# Patient Record
Sex: Male | Born: 1950
Health system: Southern US, Community
[De-identification: ages and names within clinical notes are randomized; demographics above are authoritative.]

## PROBLEM LIST (undated history)

## (undated) DIAGNOSIS — I10 Essential (primary) hypertension: Secondary | ICD-10-CM

## (undated) DIAGNOSIS — K219 Gastro-esophageal reflux disease without esophagitis: Secondary | ICD-10-CM

## (undated) HISTORY — PX: NO PAST SURGERIES: SHX2092

---

## 2000-11-05 ENCOUNTER — Encounter: Payer: Self-pay | Admitting: Emergency Medicine

## 2000-11-06 ENCOUNTER — Inpatient Hospital Stay (HOSPITAL_COMMUNITY): Admission: EM | Admit: 2000-11-06 | Discharge: 2000-11-07 | Payer: Self-pay | Admitting: *Deleted

## 2000-11-07 ENCOUNTER — Encounter: Payer: Self-pay | Admitting: Neurology

## 2000-11-13 ENCOUNTER — Ambulatory Visit (HOSPITAL_COMMUNITY): Admission: RE | Admit: 2000-11-13 | Discharge: 2000-11-13 | Payer: Self-pay | Admitting: Neurology

## 2000-11-13 ENCOUNTER — Encounter: Payer: Self-pay | Admitting: Neurology

## 2011-08-27 DIAGNOSIS — H2589 Other age-related cataract: Secondary | ICD-10-CM | POA: Diagnosis not present

## 2011-08-27 DIAGNOSIS — H02139 Senile ectropion of unspecified eye, unspecified eyelid: Secondary | ICD-10-CM | POA: Diagnosis not present

## 2011-08-27 DIAGNOSIS — H04129 Dry eye syndrome of unspecified lacrimal gland: Secondary | ICD-10-CM | POA: Diagnosis not present

## 2011-10-02 DIAGNOSIS — J020 Streptococcal pharyngitis: Secondary | ICD-10-CM | POA: Diagnosis not present

## 2011-10-02 DIAGNOSIS — E782 Mixed hyperlipidemia: Secondary | ICD-10-CM | POA: Diagnosis not present

## 2011-10-02 DIAGNOSIS — E7800 Pure hypercholesterolemia, unspecified: Secondary | ICD-10-CM | POA: Diagnosis not present

## 2011-10-02 DIAGNOSIS — Z79899 Other long term (current) drug therapy: Secondary | ICD-10-CM | POA: Diagnosis not present

## 2011-10-02 DIAGNOSIS — I10 Essential (primary) hypertension: Secondary | ICD-10-CM | POA: Diagnosis not present

## 2011-10-02 DIAGNOSIS — I6350 Cerebral infarction due to unspecified occlusion or stenosis of unspecified cerebral artery: Secondary | ICD-10-CM | POA: Diagnosis not present

## 2012-03-31 DIAGNOSIS — Z0000 Encounter for general adult medical examination without abnormal findings: Secondary | ICD-10-CM | POA: Diagnosis not present

## 2012-03-31 DIAGNOSIS — I10 Essential (primary) hypertension: Secondary | ICD-10-CM | POA: Diagnosis not present

## 2012-03-31 DIAGNOSIS — Z125 Encounter for screening for malignant neoplasm of prostate: Secondary | ICD-10-CM | POA: Diagnosis not present

## 2012-03-31 DIAGNOSIS — Z6832 Body mass index (BMI) 32.0-32.9, adult: Secondary | ICD-10-CM | POA: Diagnosis not present

## 2012-03-31 DIAGNOSIS — E782 Mixed hyperlipidemia: Secondary | ICD-10-CM | POA: Diagnosis not present

## 2012-05-03 DIAGNOSIS — R7401 Elevation of levels of liver transaminase levels: Secondary | ICD-10-CM | POA: Diagnosis not present

## 2012-06-02 DIAGNOSIS — Z23 Encounter for immunization: Secondary | ICD-10-CM | POA: Diagnosis not present

## 2012-09-08 DIAGNOSIS — H02139 Senile ectropion of unspecified eye, unspecified eyelid: Secondary | ICD-10-CM | POA: Diagnosis not present

## 2012-09-08 DIAGNOSIS — H04129 Dry eye syndrome of unspecified lacrimal gland: Secondary | ICD-10-CM | POA: Diagnosis not present

## 2012-10-03 DIAGNOSIS — E782 Mixed hyperlipidemia: Secondary | ICD-10-CM | POA: Diagnosis not present

## 2012-10-03 DIAGNOSIS — I10 Essential (primary) hypertension: Secondary | ICD-10-CM | POA: Diagnosis not present

## 2012-10-03 DIAGNOSIS — G4700 Insomnia, unspecified: Secondary | ICD-10-CM | POA: Diagnosis not present

## 2012-10-03 DIAGNOSIS — I6350 Cerebral infarction due to unspecified occlusion or stenosis of unspecified cerebral artery: Secondary | ICD-10-CM | POA: Diagnosis not present

## 2013-05-31 DIAGNOSIS — Z23 Encounter for immunization: Secondary | ICD-10-CM | POA: Diagnosis not present

## 2013-06-15 DIAGNOSIS — I10 Essential (primary) hypertension: Secondary | ICD-10-CM | POA: Diagnosis not present

## 2013-06-15 DIAGNOSIS — Z125 Encounter for screening for malignant neoplasm of prostate: Secondary | ICD-10-CM | POA: Diagnosis not present

## 2013-06-15 DIAGNOSIS — I6350 Cerebral infarction due to unspecified occlusion or stenosis of unspecified cerebral artery: Secondary | ICD-10-CM | POA: Diagnosis not present

## 2013-06-15 DIAGNOSIS — G4700 Insomnia, unspecified: Secondary | ICD-10-CM | POA: Diagnosis not present

## 2013-06-15 DIAGNOSIS — Z0000 Encounter for general adult medical examination without abnormal findings: Secondary | ICD-10-CM | POA: Diagnosis not present

## 2013-06-15 DIAGNOSIS — E782 Mixed hyperlipidemia: Secondary | ICD-10-CM | POA: Diagnosis not present

## 2013-06-15 DIAGNOSIS — Z79899 Other long term (current) drug therapy: Secondary | ICD-10-CM | POA: Diagnosis not present

## 2013-09-07 DIAGNOSIS — H2589 Other age-related cataract: Secondary | ICD-10-CM | POA: Diagnosis not present

## 2013-09-07 DIAGNOSIS — H5200 Hypermetropia, unspecified eye: Secondary | ICD-10-CM | POA: Diagnosis not present

## 2013-12-19 DIAGNOSIS — G4700 Insomnia, unspecified: Secondary | ICD-10-CM | POA: Diagnosis not present

## 2013-12-19 DIAGNOSIS — E782 Mixed hyperlipidemia: Secondary | ICD-10-CM | POA: Diagnosis not present

## 2013-12-19 DIAGNOSIS — I6350 Cerebral infarction due to unspecified occlusion or stenosis of unspecified cerebral artery: Secondary | ICD-10-CM | POA: Diagnosis not present

## 2013-12-19 DIAGNOSIS — I10 Essential (primary) hypertension: Secondary | ICD-10-CM | POA: Diagnosis not present

## 2014-06-21 DIAGNOSIS — Z79899 Other long term (current) drug therapy: Secondary | ICD-10-CM | POA: Diagnosis not present

## 2014-06-21 DIAGNOSIS — K219 Gastro-esophageal reflux disease without esophagitis: Secondary | ICD-10-CM | POA: Diagnosis not present

## 2014-06-21 DIAGNOSIS — I639 Cerebral infarction, unspecified: Secondary | ICD-10-CM | POA: Diagnosis not present

## 2014-06-21 DIAGNOSIS — G4700 Insomnia, unspecified: Secondary | ICD-10-CM | POA: Diagnosis not present

## 2014-06-21 DIAGNOSIS — Z9181 History of falling: Secondary | ICD-10-CM | POA: Diagnosis not present

## 2014-06-21 DIAGNOSIS — Z0000 Encounter for general adult medical examination without abnormal findings: Secondary | ICD-10-CM | POA: Diagnosis not present

## 2014-06-21 DIAGNOSIS — Z23 Encounter for immunization: Secondary | ICD-10-CM | POA: Diagnosis not present

## 2014-06-21 DIAGNOSIS — I10 Essential (primary) hypertension: Secondary | ICD-10-CM | POA: Diagnosis not present

## 2014-06-21 DIAGNOSIS — E782 Mixed hyperlipidemia: Secondary | ICD-10-CM | POA: Diagnosis not present

## 2014-06-21 DIAGNOSIS — Z125 Encounter for screening for malignant neoplasm of prostate: Secondary | ICD-10-CM | POA: Diagnosis not present

## 2014-08-31 DIAGNOSIS — H04129 Dry eye syndrome of unspecified lacrimal gland: Secondary | ICD-10-CM | POA: Diagnosis not present

## 2014-08-31 DIAGNOSIS — H5203 Hypermetropia, bilateral: Secondary | ICD-10-CM | POA: Diagnosis not present

## 2014-12-24 DIAGNOSIS — I10 Essential (primary) hypertension: Secondary | ICD-10-CM | POA: Diagnosis not present

## 2014-12-24 DIAGNOSIS — R7309 Other abnormal glucose: Secondary | ICD-10-CM | POA: Diagnosis not present

## 2014-12-24 DIAGNOSIS — Z6832 Body mass index (BMI) 32.0-32.9, adult: Secondary | ICD-10-CM | POA: Diagnosis not present

## 2014-12-24 DIAGNOSIS — Z1389 Encounter for screening for other disorder: Secondary | ICD-10-CM | POA: Diagnosis not present

## 2014-12-24 DIAGNOSIS — I639 Cerebral infarction, unspecified: Secondary | ICD-10-CM | POA: Diagnosis not present

## 2014-12-24 DIAGNOSIS — G4700 Insomnia, unspecified: Secondary | ICD-10-CM | POA: Diagnosis not present

## 2014-12-24 DIAGNOSIS — E782 Mixed hyperlipidemia: Secondary | ICD-10-CM | POA: Diagnosis not present

## 2015-01-17 DIAGNOSIS — Z1389 Encounter for screening for other disorder: Secondary | ICD-10-CM | POA: Diagnosis not present

## 2015-01-17 DIAGNOSIS — Z6831 Body mass index (BMI) 31.0-31.9, adult: Secondary | ICD-10-CM | POA: Diagnosis not present

## 2015-01-17 DIAGNOSIS — L309 Dermatitis, unspecified: Secondary | ICD-10-CM | POA: Diagnosis not present

## 2015-01-17 DIAGNOSIS — B3742 Candidal balanitis: Secondary | ICD-10-CM | POA: Diagnosis not present

## 2015-03-27 DIAGNOSIS — Z1389 Encounter for screening for other disorder: Secondary | ICD-10-CM | POA: Diagnosis not present

## 2015-03-27 DIAGNOSIS — Z6830 Body mass index (BMI) 30.0-30.9, adult: Secondary | ICD-10-CM | POA: Diagnosis not present

## 2015-03-27 DIAGNOSIS — B3742 Candidal balanitis: Secondary | ICD-10-CM | POA: Diagnosis not present

## 2015-03-27 DIAGNOSIS — R7309 Other abnormal glucose: Secondary | ICD-10-CM | POA: Diagnosis not present

## 2015-06-28 DIAGNOSIS — R7303 Prediabetes: Secondary | ICD-10-CM | POA: Diagnosis not present

## 2015-06-28 DIAGNOSIS — N481 Balanitis: Secondary | ICD-10-CM | POA: Diagnosis not present

## 2015-06-28 DIAGNOSIS — Z0000 Encounter for general adult medical examination without abnormal findings: Secondary | ICD-10-CM | POA: Diagnosis not present

## 2015-06-28 DIAGNOSIS — Z23 Encounter for immunization: Secondary | ICD-10-CM | POA: Diagnosis not present

## 2015-06-28 DIAGNOSIS — Z125 Encounter for screening for malignant neoplasm of prostate: Secondary | ICD-10-CM | POA: Diagnosis not present

## 2015-06-28 DIAGNOSIS — Z86010 Personal history of colonic polyps: Secondary | ICD-10-CM | POA: Diagnosis not present

## 2015-06-28 DIAGNOSIS — E782 Mixed hyperlipidemia: Secondary | ICD-10-CM | POA: Diagnosis not present

## 2015-06-28 DIAGNOSIS — G4700 Insomnia, unspecified: Secondary | ICD-10-CM | POA: Diagnosis not present

## 2015-06-28 DIAGNOSIS — I639 Cerebral infarction, unspecified: Secondary | ICD-10-CM | POA: Diagnosis not present

## 2015-06-28 DIAGNOSIS — I10 Essential (primary) hypertension: Secondary | ICD-10-CM | POA: Diagnosis not present

## 2015-06-28 DIAGNOSIS — M79645 Pain in left finger(s): Secondary | ICD-10-CM | POA: Diagnosis not present

## 2015-06-28 DIAGNOSIS — Z6831 Body mass index (BMI) 31.0-31.9, adult: Secondary | ICD-10-CM | POA: Diagnosis not present

## 2015-07-17 DIAGNOSIS — M1812 Unilateral primary osteoarthritis of first carpometacarpal joint, left hand: Secondary | ICD-10-CM | POA: Diagnosis not present

## 2015-07-17 DIAGNOSIS — M19042 Primary osteoarthritis, left hand: Secondary | ICD-10-CM | POA: Diagnosis not present

## 2015-07-17 DIAGNOSIS — M79645 Pain in left finger(s): Secondary | ICD-10-CM | POA: Diagnosis not present

## 2015-07-29 DIAGNOSIS — M19042 Primary osteoarthritis, left hand: Secondary | ICD-10-CM | POA: Diagnosis not present

## 2015-08-20 DIAGNOSIS — Z1211 Encounter for screening for malignant neoplasm of colon: Secondary | ICD-10-CM | POA: Diagnosis not present

## 2015-08-20 DIAGNOSIS — Z86010 Personal history of colonic polyps: Secondary | ICD-10-CM | POA: Diagnosis not present

## 2015-09-20 DIAGNOSIS — H2513 Age-related nuclear cataract, bilateral: Secondary | ICD-10-CM | POA: Diagnosis not present

## 2015-09-20 DIAGNOSIS — H5203 Hypermetropia, bilateral: Secondary | ICD-10-CM | POA: Diagnosis not present

## 2015-09-20 DIAGNOSIS — H04129 Dry eye syndrome of unspecified lacrimal gland: Secondary | ICD-10-CM | POA: Diagnosis not present

## 2015-09-23 DIAGNOSIS — G4700 Insomnia, unspecified: Secondary | ICD-10-CM | POA: Diagnosis not present

## 2015-09-23 DIAGNOSIS — D124 Benign neoplasm of descending colon: Secondary | ICD-10-CM | POA: Diagnosis not present

## 2015-09-23 DIAGNOSIS — Z800 Family history of malignant neoplasm of digestive organs: Secondary | ICD-10-CM | POA: Diagnosis not present

## 2015-09-23 DIAGNOSIS — K648 Other hemorrhoids: Secondary | ICD-10-CM | POA: Diagnosis not present

## 2015-09-23 DIAGNOSIS — Z87891 Personal history of nicotine dependence: Secondary | ICD-10-CM | POA: Diagnosis not present

## 2015-09-23 DIAGNOSIS — K219 Gastro-esophageal reflux disease without esophagitis: Secondary | ICD-10-CM | POA: Diagnosis not present

## 2015-09-23 DIAGNOSIS — Z86010 Personal history of colonic polyps: Secondary | ICD-10-CM | POA: Diagnosis not present

## 2015-09-23 DIAGNOSIS — Z1211 Encounter for screening for malignant neoplasm of colon: Secondary | ICD-10-CM | POA: Diagnosis not present

## 2015-09-23 DIAGNOSIS — E785 Hyperlipidemia, unspecified: Secondary | ICD-10-CM | POA: Diagnosis not present

## 2015-09-23 DIAGNOSIS — Z8673 Personal history of transient ischemic attack (TIA), and cerebral infarction without residual deficits: Secondary | ICD-10-CM | POA: Diagnosis not present

## 2015-09-23 DIAGNOSIS — K5730 Diverticulosis of large intestine without perforation or abscess without bleeding: Secondary | ICD-10-CM | POA: Diagnosis not present

## 2015-10-31 DIAGNOSIS — Z1389 Encounter for screening for other disorder: Secondary | ICD-10-CM | POA: Diagnosis not present

## 2015-10-31 DIAGNOSIS — I10 Essential (primary) hypertension: Secondary | ICD-10-CM | POA: Diagnosis not present

## 2015-10-31 DIAGNOSIS — R7303 Prediabetes: Secondary | ICD-10-CM | POA: Diagnosis not present

## 2015-10-31 DIAGNOSIS — E782 Mixed hyperlipidemia: Secondary | ICD-10-CM | POA: Diagnosis not present

## 2015-10-31 DIAGNOSIS — E669 Obesity, unspecified: Secondary | ICD-10-CM | POA: Diagnosis not present

## 2015-10-31 DIAGNOSIS — G4700 Insomnia, unspecified: Secondary | ICD-10-CM | POA: Diagnosis not present

## 2015-10-31 DIAGNOSIS — I639 Cerebral infarction, unspecified: Secondary | ICD-10-CM | POA: Diagnosis not present

## 2015-10-31 DIAGNOSIS — M19042 Primary osteoarthritis, left hand: Secondary | ICD-10-CM | POA: Diagnosis not present

## 2015-10-31 DIAGNOSIS — Z6831 Body mass index (BMI) 31.0-31.9, adult: Secondary | ICD-10-CM | POA: Diagnosis not present

## 2016-03-05 DIAGNOSIS — G4700 Insomnia, unspecified: Secondary | ICD-10-CM | POA: Diagnosis not present

## 2016-03-05 DIAGNOSIS — N481 Balanitis: Secondary | ICD-10-CM | POA: Diagnosis not present

## 2016-03-05 DIAGNOSIS — I10 Essential (primary) hypertension: Secondary | ICD-10-CM | POA: Diagnosis not present

## 2016-03-05 DIAGNOSIS — I639 Cerebral infarction, unspecified: Secondary | ICD-10-CM | POA: Diagnosis not present

## 2016-03-05 DIAGNOSIS — E782 Mixed hyperlipidemia: Secondary | ICD-10-CM | POA: Diagnosis not present

## 2016-03-05 DIAGNOSIS — R7303 Prediabetes: Secondary | ICD-10-CM | POA: Diagnosis not present

## 2016-03-05 DIAGNOSIS — K219 Gastro-esophageal reflux disease without esophagitis: Secondary | ICD-10-CM | POA: Diagnosis not present

## 2016-05-29 DIAGNOSIS — Z23 Encounter for immunization: Secondary | ICD-10-CM | POA: Diagnosis not present

## 2016-07-08 DIAGNOSIS — E782 Mixed hyperlipidemia: Secondary | ICD-10-CM | POA: Diagnosis not present

## 2016-07-08 DIAGNOSIS — R1031 Right lower quadrant pain: Secondary | ICD-10-CM | POA: Diagnosis not present

## 2016-07-08 DIAGNOSIS — Z0000 Encounter for general adult medical examination without abnormal findings: Secondary | ICD-10-CM | POA: Diagnosis not present

## 2016-07-08 DIAGNOSIS — Z23 Encounter for immunization: Secondary | ICD-10-CM | POA: Diagnosis not present

## 2016-07-08 DIAGNOSIS — G4700 Insomnia, unspecified: Secondary | ICD-10-CM | POA: Diagnosis not present

## 2016-07-08 DIAGNOSIS — Z6832 Body mass index (BMI) 32.0-32.9, adult: Secondary | ICD-10-CM | POA: Diagnosis not present

## 2016-07-08 DIAGNOSIS — Z8673 Personal history of transient ischemic attack (TIA), and cerebral infarction without residual deficits: Secondary | ICD-10-CM | POA: Diagnosis not present

## 2016-07-08 DIAGNOSIS — R7303 Prediabetes: Secondary | ICD-10-CM | POA: Diagnosis not present

## 2016-07-08 DIAGNOSIS — I10 Essential (primary) hypertension: Secondary | ICD-10-CM | POA: Diagnosis not present

## 2016-07-08 DIAGNOSIS — Z125 Encounter for screening for malignant neoplasm of prostate: Secondary | ICD-10-CM | POA: Diagnosis not present

## 2016-09-18 DIAGNOSIS — H2513 Age-related nuclear cataract, bilateral: Secondary | ICD-10-CM | POA: Diagnosis not present

## 2016-09-18 DIAGNOSIS — H524 Presbyopia: Secondary | ICD-10-CM | POA: Diagnosis not present

## 2016-09-18 DIAGNOSIS — H04123 Dry eye syndrome of bilateral lacrimal glands: Secondary | ICD-10-CM | POA: Diagnosis not present

## 2016-10-21 DIAGNOSIS — B356 Tinea cruris: Secondary | ICD-10-CM | POA: Diagnosis not present

## 2016-10-21 DIAGNOSIS — Z6833 Body mass index (BMI) 33.0-33.9, adult: Secondary | ICD-10-CM | POA: Diagnosis not present

## 2016-11-03 DIAGNOSIS — B356 Tinea cruris: Secondary | ICD-10-CM | POA: Diagnosis not present

## 2016-11-03 DIAGNOSIS — Z6833 Body mass index (BMI) 33.0-33.9, adult: Secondary | ICD-10-CM | POA: Diagnosis not present

## 2016-11-16 DIAGNOSIS — R21 Rash and other nonspecific skin eruption: Secondary | ICD-10-CM | POA: Diagnosis not present

## 2016-11-16 DIAGNOSIS — E669 Obesity, unspecified: Secondary | ICD-10-CM | POA: Diagnosis not present

## 2016-11-16 DIAGNOSIS — Z6833 Body mass index (BMI) 33.0-33.9, adult: Secondary | ICD-10-CM | POA: Diagnosis not present

## 2016-11-23 DIAGNOSIS — L853 Xerosis cutis: Secondary | ICD-10-CM | POA: Diagnosis not present

## 2016-11-23 DIAGNOSIS — L304 Erythema intertrigo: Secondary | ICD-10-CM | POA: Diagnosis not present

## 2016-12-21 DIAGNOSIS — L304 Erythema intertrigo: Secondary | ICD-10-CM | POA: Diagnosis not present

## 2017-01-07 DIAGNOSIS — I10 Essential (primary) hypertension: Secondary | ICD-10-CM | POA: Diagnosis not present

## 2017-01-07 DIAGNOSIS — E782 Mixed hyperlipidemia: Secondary | ICD-10-CM | POA: Diagnosis not present

## 2017-01-07 DIAGNOSIS — Z1389 Encounter for screening for other disorder: Secondary | ICD-10-CM | POA: Diagnosis not present

## 2017-01-07 DIAGNOSIS — G4700 Insomnia, unspecified: Secondary | ICD-10-CM | POA: Diagnosis not present

## 2017-01-07 DIAGNOSIS — I639 Cerebral infarction, unspecified: Secondary | ICD-10-CM | POA: Diagnosis not present

## 2017-01-07 DIAGNOSIS — Z23 Encounter for immunization: Secondary | ICD-10-CM | POA: Diagnosis not present

## 2017-01-07 DIAGNOSIS — Z9181 History of falling: Secondary | ICD-10-CM | POA: Diagnosis not present

## 2017-01-07 DIAGNOSIS — R7303 Prediabetes: Secondary | ICD-10-CM | POA: Diagnosis not present

## 2017-01-12 DIAGNOSIS — B356 Tinea cruris: Secondary | ICD-10-CM | POA: Diagnosis not present

## 2017-05-12 DIAGNOSIS — Z23 Encounter for immunization: Secondary | ICD-10-CM | POA: Diagnosis not present

## 2017-07-09 DIAGNOSIS — Z6831 Body mass index (BMI) 31.0-31.9, adult: Secondary | ICD-10-CM | POA: Diagnosis not present

## 2017-07-09 DIAGNOSIS — E785 Hyperlipidemia, unspecified: Secondary | ICD-10-CM | POA: Diagnosis not present

## 2017-07-09 DIAGNOSIS — E669 Obesity, unspecified: Secondary | ICD-10-CM | POA: Diagnosis not present

## 2017-07-09 DIAGNOSIS — Z125 Encounter for screening for malignant neoplasm of prostate: Secondary | ICD-10-CM | POA: Diagnosis not present

## 2017-07-09 DIAGNOSIS — Z136 Encounter for screening for cardiovascular disorders: Secondary | ICD-10-CM | POA: Diagnosis not present

## 2017-07-09 DIAGNOSIS — Z139 Encounter for screening, unspecified: Secondary | ICD-10-CM | POA: Diagnosis not present

## 2017-07-09 DIAGNOSIS — Z0000 Encounter for general adult medical examination without abnormal findings: Secondary | ICD-10-CM | POA: Diagnosis not present

## 2017-07-22 DIAGNOSIS — I10 Essential (primary) hypertension: Secondary | ICD-10-CM | POA: Diagnosis not present

## 2017-07-22 DIAGNOSIS — E782 Mixed hyperlipidemia: Secondary | ICD-10-CM | POA: Diagnosis not present

## 2017-07-22 DIAGNOSIS — Z125 Encounter for screening for malignant neoplasm of prostate: Secondary | ICD-10-CM | POA: Diagnosis not present

## 2017-07-22 DIAGNOSIS — R7303 Prediabetes: Secondary | ICD-10-CM | POA: Diagnosis not present

## 2017-07-22 DIAGNOSIS — I639 Cerebral infarction, unspecified: Secondary | ICD-10-CM | POA: Diagnosis not present

## 2017-07-22 DIAGNOSIS — G4700 Insomnia, unspecified: Secondary | ICD-10-CM | POA: Diagnosis not present

## 2017-09-30 DIAGNOSIS — J209 Acute bronchitis, unspecified: Secondary | ICD-10-CM | POA: Diagnosis not present

## 2017-11-08 DIAGNOSIS — H5203 Hypermetropia, bilateral: Secondary | ICD-10-CM | POA: Diagnosis not present

## 2017-11-08 DIAGNOSIS — H04123 Dry eye syndrome of bilateral lacrimal glands: Secondary | ICD-10-CM | POA: Diagnosis not present

## 2017-11-08 DIAGNOSIS — H524 Presbyopia: Secondary | ICD-10-CM | POA: Diagnosis not present

## 2017-11-08 DIAGNOSIS — H25813 Combined forms of age-related cataract, bilateral: Secondary | ICD-10-CM | POA: Diagnosis not present

## 2017-11-08 DIAGNOSIS — H52223 Regular astigmatism, bilateral: Secondary | ICD-10-CM | POA: Diagnosis not present

## 2018-01-20 DIAGNOSIS — Z9181 History of falling: Secondary | ICD-10-CM | POA: Diagnosis not present

## 2018-01-20 DIAGNOSIS — R7303 Prediabetes: Secondary | ICD-10-CM | POA: Diagnosis not present

## 2018-01-20 DIAGNOSIS — I639 Cerebral infarction, unspecified: Secondary | ICD-10-CM | POA: Diagnosis not present

## 2018-01-20 DIAGNOSIS — I10 Essential (primary) hypertension: Secondary | ICD-10-CM | POA: Diagnosis not present

## 2018-01-20 DIAGNOSIS — E782 Mixed hyperlipidemia: Secondary | ICD-10-CM | POA: Diagnosis not present

## 2018-01-20 DIAGNOSIS — Z1331 Encounter for screening for depression: Secondary | ICD-10-CM | POA: Diagnosis not present

## 2018-03-17 ENCOUNTER — Other Ambulatory Visit: Payer: Self-pay

## 2018-05-17 DIAGNOSIS — Z23 Encounter for immunization: Secondary | ICD-10-CM | POA: Diagnosis not present

## 2018-05-17 DIAGNOSIS — T24201A Burn of second degree of unspecified site of right lower limb, except ankle and foot, initial encounter: Secondary | ICD-10-CM | POA: Diagnosis not present

## 2018-07-11 DIAGNOSIS — Z23 Encounter for immunization: Secondary | ICD-10-CM | POA: Diagnosis not present

## 2018-07-11 DIAGNOSIS — E785 Hyperlipidemia, unspecified: Secondary | ICD-10-CM | POA: Diagnosis not present

## 2018-07-11 DIAGNOSIS — Z0000 Encounter for general adult medical examination without abnormal findings: Secondary | ICD-10-CM | POA: Diagnosis not present

## 2018-07-11 DIAGNOSIS — Z1339 Encounter for screening examination for other mental health and behavioral disorders: Secondary | ICD-10-CM | POA: Diagnosis not present

## 2018-07-11 DIAGNOSIS — E669 Obesity, unspecified: Secondary | ICD-10-CM | POA: Diagnosis not present

## 2018-07-11 DIAGNOSIS — Z136 Encounter for screening for cardiovascular disorders: Secondary | ICD-10-CM | POA: Diagnosis not present

## 2018-07-11 DIAGNOSIS — Z125 Encounter for screening for malignant neoplasm of prostate: Secondary | ICD-10-CM | POA: Diagnosis not present

## 2018-07-11 DIAGNOSIS — Z6833 Body mass index (BMI) 33.0-33.9, adult: Secondary | ICD-10-CM | POA: Diagnosis not present

## 2018-07-27 DIAGNOSIS — R7303 Prediabetes: Secondary | ICD-10-CM | POA: Diagnosis not present

## 2018-07-27 DIAGNOSIS — Z125 Encounter for screening for malignant neoplasm of prostate: Secondary | ICD-10-CM | POA: Diagnosis not present

## 2018-07-27 DIAGNOSIS — G4700 Insomnia, unspecified: Secondary | ICD-10-CM | POA: Diagnosis not present

## 2018-07-27 DIAGNOSIS — E782 Mixed hyperlipidemia: Secondary | ICD-10-CM | POA: Diagnosis not present

## 2018-07-27 DIAGNOSIS — I639 Cerebral infarction, unspecified: Secondary | ICD-10-CM | POA: Diagnosis not present

## 2018-08-29 DIAGNOSIS — R7303 Prediabetes: Secondary | ICD-10-CM | POA: Diagnosis not present

## 2018-09-20 DIAGNOSIS — L309 Dermatitis, unspecified: Secondary | ICD-10-CM | POA: Diagnosis not present

## 2018-09-20 DIAGNOSIS — Z6833 Body mass index (BMI) 33.0-33.9, adult: Secondary | ICD-10-CM | POA: Diagnosis not present

## 2018-09-20 DIAGNOSIS — E119 Type 2 diabetes mellitus without complications: Secondary | ICD-10-CM | POA: Diagnosis not present

## 2018-11-30 ENCOUNTER — Encounter: Payer: Self-pay | Admitting: Gastroenterology

## 2019-01-26 DIAGNOSIS — I639 Cerebral infarction, unspecified: Secondary | ICD-10-CM | POA: Diagnosis not present

## 2019-01-26 DIAGNOSIS — I10 Essential (primary) hypertension: Secondary | ICD-10-CM | POA: Diagnosis not present

## 2019-01-26 DIAGNOSIS — E782 Mixed hyperlipidemia: Secondary | ICD-10-CM | POA: Diagnosis not present

## 2019-01-26 DIAGNOSIS — Z1331 Encounter for screening for depression: Secondary | ICD-10-CM | POA: Diagnosis not present

## 2019-01-26 DIAGNOSIS — E119 Type 2 diabetes mellitus without complications: Secondary | ICD-10-CM | POA: Diagnosis not present

## 2019-04-03 DIAGNOSIS — Z6831 Body mass index (BMI) 31.0-31.9, adult: Secondary | ICD-10-CM | POA: Diagnosis not present

## 2019-04-03 DIAGNOSIS — L989 Disorder of the skin and subcutaneous tissue, unspecified: Secondary | ICD-10-CM | POA: Diagnosis not present

## 2019-04-12 DIAGNOSIS — C4442 Squamous cell carcinoma of skin of scalp and neck: Secondary | ICD-10-CM | POA: Diagnosis not present

## 2019-04-28 DIAGNOSIS — Z139 Encounter for screening, unspecified: Secondary | ICD-10-CM | POA: Diagnosis not present

## 2019-04-28 DIAGNOSIS — I639 Cerebral infarction, unspecified: Secondary | ICD-10-CM | POA: Diagnosis not present

## 2019-04-28 DIAGNOSIS — E782 Mixed hyperlipidemia: Secondary | ICD-10-CM | POA: Diagnosis not present

## 2019-04-28 DIAGNOSIS — E119 Type 2 diabetes mellitus without complications: Secondary | ICD-10-CM | POA: Diagnosis not present

## 2019-04-28 DIAGNOSIS — G4700 Insomnia, unspecified: Secondary | ICD-10-CM | POA: Diagnosis not present

## 2019-04-28 DIAGNOSIS — Z23 Encounter for immunization: Secondary | ICD-10-CM | POA: Diagnosis not present

## 2019-07-24 DIAGNOSIS — Z125 Encounter for screening for malignant neoplasm of prostate: Secondary | ICD-10-CM | POA: Diagnosis not present

## 2019-07-24 DIAGNOSIS — Z6831 Body mass index (BMI) 31.0-31.9, adult: Secondary | ICD-10-CM | POA: Diagnosis not present

## 2019-07-24 DIAGNOSIS — E785 Hyperlipidemia, unspecified: Secondary | ICD-10-CM | POA: Diagnosis not present

## 2019-07-24 DIAGNOSIS — Z1211 Encounter for screening for malignant neoplasm of colon: Secondary | ICD-10-CM | POA: Diagnosis not present

## 2019-07-24 DIAGNOSIS — Z0000 Encounter for general adult medical examination without abnormal findings: Secondary | ICD-10-CM | POA: Diagnosis not present

## 2019-07-24 DIAGNOSIS — Z9181 History of falling: Secondary | ICD-10-CM | POA: Diagnosis not present

## 2019-07-24 DIAGNOSIS — Z1331 Encounter for screening for depression: Secondary | ICD-10-CM | POA: Diagnosis not present

## 2019-10-27 DIAGNOSIS — I639 Cerebral infarction, unspecified: Secondary | ICD-10-CM | POA: Diagnosis not present

## 2019-10-27 DIAGNOSIS — E782 Mixed hyperlipidemia: Secondary | ICD-10-CM | POA: Diagnosis not present

## 2019-10-27 DIAGNOSIS — Z125 Encounter for screening for malignant neoplasm of prostate: Secondary | ICD-10-CM | POA: Diagnosis not present

## 2019-10-27 DIAGNOSIS — H6120 Impacted cerumen, unspecified ear: Secondary | ICD-10-CM | POA: Diagnosis not present

## 2019-10-27 DIAGNOSIS — E119 Type 2 diabetes mellitus without complications: Secondary | ICD-10-CM | POA: Diagnosis not present

## 2019-10-27 DIAGNOSIS — I10 Essential (primary) hypertension: Secondary | ICD-10-CM | POA: Diagnosis not present

## 2019-12-29 DIAGNOSIS — H524 Presbyopia: Secondary | ICD-10-CM | POA: Diagnosis not present

## 2019-12-29 DIAGNOSIS — H2513 Age-related nuclear cataract, bilateral: Secondary | ICD-10-CM | POA: Diagnosis not present

## 2019-12-29 DIAGNOSIS — H5203 Hypermetropia, bilateral: Secondary | ICD-10-CM | POA: Diagnosis not present

## 2019-12-29 DIAGNOSIS — H52223 Regular astigmatism, bilateral: Secondary | ICD-10-CM | POA: Diagnosis not present

## 2020-03-04 DIAGNOSIS — R42 Dizziness and giddiness: Secondary | ICD-10-CM | POA: Diagnosis not present

## 2020-03-04 DIAGNOSIS — R2681 Unsteadiness on feet: Secondary | ICD-10-CM | POA: Diagnosis not present

## 2020-03-04 DIAGNOSIS — H8110 Benign paroxysmal vertigo, unspecified ear: Secondary | ICD-10-CM | POA: Diagnosis not present

## 2020-03-20 DIAGNOSIS — H8110 Benign paroxysmal vertigo, unspecified ear: Secondary | ICD-10-CM | POA: Diagnosis not present

## 2020-03-20 DIAGNOSIS — Z6830 Body mass index (BMI) 30.0-30.9, adult: Secondary | ICD-10-CM | POA: Diagnosis not present

## 2020-03-20 DIAGNOSIS — I10 Essential (primary) hypertension: Secondary | ICD-10-CM | POA: Diagnosis not present

## 2020-03-20 DIAGNOSIS — G4700 Insomnia, unspecified: Secondary | ICD-10-CM | POA: Diagnosis not present

## 2020-03-21 DIAGNOSIS — H8110 Benign paroxysmal vertigo, unspecified ear: Secondary | ICD-10-CM | POA: Diagnosis not present

## 2020-03-21 DIAGNOSIS — R42 Dizziness and giddiness: Secondary | ICD-10-CM | POA: Diagnosis not present

## 2020-03-21 DIAGNOSIS — R2681 Unsteadiness on feet: Secondary | ICD-10-CM | POA: Diagnosis not present

## 2020-03-27 DIAGNOSIS — G4700 Insomnia, unspecified: Secondary | ICD-10-CM | POA: Diagnosis not present

## 2020-03-27 DIAGNOSIS — Z6831 Body mass index (BMI) 31.0-31.9, adult: Secondary | ICD-10-CM | POA: Diagnosis not present

## 2020-03-27 DIAGNOSIS — I10 Essential (primary) hypertension: Secondary | ICD-10-CM | POA: Diagnosis not present

## 2020-03-27 DIAGNOSIS — H8110 Benign paroxysmal vertigo, unspecified ear: Secondary | ICD-10-CM | POA: Diagnosis not present

## 2020-03-28 DIAGNOSIS — R42 Dizziness and giddiness: Secondary | ICD-10-CM | POA: Diagnosis not present

## 2020-03-28 DIAGNOSIS — R2681 Unsteadiness on feet: Secondary | ICD-10-CM | POA: Diagnosis not present

## 2020-03-28 DIAGNOSIS — H8110 Benign paroxysmal vertigo, unspecified ear: Secondary | ICD-10-CM | POA: Diagnosis not present

## 2020-04-03 DIAGNOSIS — H8110 Benign paroxysmal vertigo, unspecified ear: Secondary | ICD-10-CM | POA: Diagnosis not present

## 2020-04-03 DIAGNOSIS — R2681 Unsteadiness on feet: Secondary | ICD-10-CM | POA: Diagnosis not present

## 2020-04-03 DIAGNOSIS — R42 Dizziness and giddiness: Secondary | ICD-10-CM | POA: Diagnosis not present

## 2020-04-09 DIAGNOSIS — H8110 Benign paroxysmal vertigo, unspecified ear: Secondary | ICD-10-CM | POA: Diagnosis not present

## 2020-04-09 DIAGNOSIS — R42 Dizziness and giddiness: Secondary | ICD-10-CM | POA: Diagnosis not present

## 2020-04-09 DIAGNOSIS — R2681 Unsteadiness on feet: Secondary | ICD-10-CM | POA: Diagnosis not present

## 2020-04-16 DIAGNOSIS — R2681 Unsteadiness on feet: Secondary | ICD-10-CM | POA: Diagnosis not present

## 2020-04-16 DIAGNOSIS — R42 Dizziness and giddiness: Secondary | ICD-10-CM | POA: Diagnosis not present

## 2020-04-16 DIAGNOSIS — H8110 Benign paroxysmal vertigo, unspecified ear: Secondary | ICD-10-CM | POA: Diagnosis not present

## 2020-04-23 DIAGNOSIS — R42 Dizziness and giddiness: Secondary | ICD-10-CM | POA: Diagnosis not present

## 2020-04-23 DIAGNOSIS — R2681 Unsteadiness on feet: Secondary | ICD-10-CM | POA: Diagnosis not present

## 2020-04-23 DIAGNOSIS — H8110 Benign paroxysmal vertigo, unspecified ear: Secondary | ICD-10-CM | POA: Diagnosis not present

## 2020-05-01 DIAGNOSIS — I10 Essential (primary) hypertension: Secondary | ICD-10-CM | POA: Diagnosis not present

## 2020-05-01 DIAGNOSIS — G4700 Insomnia, unspecified: Secondary | ICD-10-CM | POA: Diagnosis not present

## 2020-05-01 DIAGNOSIS — E782 Mixed hyperlipidemia: Secondary | ICD-10-CM | POA: Diagnosis not present

## 2020-05-01 DIAGNOSIS — R42 Dizziness and giddiness: Secondary | ICD-10-CM | POA: Diagnosis not present

## 2020-05-01 DIAGNOSIS — I639 Cerebral infarction, unspecified: Secondary | ICD-10-CM | POA: Diagnosis not present

## 2020-05-01 DIAGNOSIS — E119 Type 2 diabetes mellitus without complications: Secondary | ICD-10-CM | POA: Diagnosis not present

## 2020-05-01 DIAGNOSIS — Z139 Encounter for screening, unspecified: Secondary | ICD-10-CM | POA: Diagnosis not present

## 2020-05-01 DIAGNOSIS — Z23 Encounter for immunization: Secondary | ICD-10-CM | POA: Diagnosis not present

## 2020-05-01 DIAGNOSIS — Z6830 Body mass index (BMI) 30.0-30.9, adult: Secondary | ICD-10-CM | POA: Diagnosis not present

## 2020-05-01 DIAGNOSIS — R2681 Unsteadiness on feet: Secondary | ICD-10-CM | POA: Diagnosis not present

## 2020-05-01 DIAGNOSIS — H8110 Benign paroxysmal vertigo, unspecified ear: Secondary | ICD-10-CM | POA: Diagnosis not present

## 2020-05-02 ENCOUNTER — Telehealth: Payer: Self-pay | Admitting: Neurology

## 2020-05-02 ENCOUNTER — Other Ambulatory Visit: Payer: Self-pay

## 2020-05-02 ENCOUNTER — Ambulatory Visit (INDEPENDENT_AMBULATORY_CARE_PROVIDER_SITE_OTHER): Payer: Medicare Other | Admitting: Neurology

## 2020-05-02 ENCOUNTER — Encounter: Payer: Self-pay | Admitting: Neurology

## 2020-05-02 VITALS — Ht 68.0 in | Wt 196.0 lb

## 2020-05-02 DIAGNOSIS — H81399 Other peripheral vertigo, unspecified ear: Secondary | ICD-10-CM | POA: Diagnosis not present

## 2020-05-02 DIAGNOSIS — R112 Nausea with vomiting, unspecified: Secondary | ICD-10-CM | POA: Diagnosis not present

## 2020-05-02 DIAGNOSIS — R634 Abnormal weight loss: Secondary | ICD-10-CM | POA: Diagnosis not present

## 2020-05-02 DIAGNOSIS — R42 Dizziness and giddiness: Secondary | ICD-10-CM

## 2020-05-02 DIAGNOSIS — I6781 Acute cerebrovascular insufficiency: Secondary | ICD-10-CM

## 2020-05-02 DIAGNOSIS — I6782 Cerebral ischemia: Secondary | ICD-10-CM

## 2020-05-02 DIAGNOSIS — Z8673 Personal history of transient ischemic attack (TIA), and cerebral infarction without residual deficits: Secondary | ICD-10-CM

## 2020-05-02 DIAGNOSIS — G9340 Encephalopathy, unspecified: Secondary | ICD-10-CM

## 2020-05-02 DIAGNOSIS — H6123 Impacted cerumen, bilateral: Secondary | ICD-10-CM | POA: Diagnosis not present

## 2020-05-02 DIAGNOSIS — H832X3 Labyrinthine dysfunction, bilateral: Secondary | ICD-10-CM | POA: Diagnosis not present

## 2020-05-02 NOTE — Patient Instructions (Signed)
It is difficult to be certain what is causing your imbalance and dizziness, and vertigo symptoms, it could be several different issues.  Please make an appointment with Cyndi Bender, PA, because of wax impaction in both ears, I could not visualize your eardrums.  Please also make an appointment with ENT for evaluation of your inner ear, evaluation for inner ear dysfunction was recommended by your physical therapist yesterday, I will make a referral to ENT for this reason.   If you have any sudden onset of severe symptoms such as headache, one-sided weakness or numbness or tingling or droopy face or slurring of speech or severe balance issue, you have to proceed to the ER. I will order a brain MRI with and without contrast.  I will also order a MR angiogram of the neck arteries to look at any type of hardening of the neck arteries or decrease in blood flow to your brain.  I will request blood test results from your primary care office as you had recent blood tests.  You could be at risk for dehydration as you have not been able to drink enough liquids.  Please try to hydrate well with liquids including water and Gatorade.  We will arrange for a follow-up after your test results.  Again, if you have any escalation of your symptoms or severe dizziness you may have to go to the ER.  They can evaluate you much quicker than any outpatient evaluation.

## 2020-05-02 NOTE — Progress Notes (Signed)
Subjective:    Patient ID: Jose Ramirez is a 69 y.o. male.  HPI     Star Age, MD, PhD [facility] [address], [address].O. [place], [name]  Dear [name],   I saw your patient, Jose Ramirez, upon your kind request, in my Neurologic clinic today for initial consultation of his dizziness, concern for positional vertigo as well as some orthostatic hypotension.  The patient is unaccompanied today.  As you know, [name] is a 69 year old right-handed gentleman with an underlying medical history of hypertension, hyperlipidemia, reflux disease, osteoarthritis, insomnia, diabetes, remote history of hemorrhagic stroke, and obesity, who has had dizziness for the past several weeks with intermittent dizziness starting some 2 years ago.  He reports that his symptoms suddenly got worse on January 16, 2020.  He reports a spinning sensation, incoordination, nonspecific balance issue and nausea and vomiting.  He has not been to the emergency room.  He did not have any imaging tests.    I reviewed your office note from 03/27/2020.  He was noted to have a normal neurological exam at the time including normal gait and station, normal muscle strength and tone, normal orientation and no impairment of recent or remote memory.  He has had physical therapy for vestibular rehab with some benefit noted.  He also had some changes in his medications including reduction of his metoprolol and trazodone with some benefit noted.  He has had symptomatic treatment with meclizine with no significant benefit noted.  He has had nausea and vomiting.   He has lost weight due to not being able to keep food down.  He has trouble tolerating even water.  He had blood work through your office yesterday.  I do not have the test results yet but we will request laboratory results from your office.  He reports that he had his ears cleaned out through your office.  He has not seen ENT.  He denies any  ringing in the ears or hearing loss but saw his physical therapist yesterday who recommended evaluation for vestibular dysfunction.  He has had ongoing issues with symptoms of spinning and difficulty with his balance.  He had a remote brain MRI with and without contrast on 11/14/2000 and I was able to review the report: [facility].  ALTERNATIVELY, IT IS  POSSIBLE THAT THIS COULD HAVE REPRESENTED A VASCULAR MALFORMATION WHICH HAS RUPTURED.  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  He reports that he recuperated well after the stroke.  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  He denies any sudden onset of one-sided weakness or numbness or tingling, has had some intermittent headache in the back of his head but not to the point where he felt he needed to take medication.  He has had some incoordination in the left hand.   His Past Medical History Is Significant For: No past medical history on file.   His Past Surgical History Is Significant For:   His Family History Is Significant For: No family history on file.  His Social History Is Significant For: Social History   Socioeconomic History  . Marital status: Single    Spouse name: Not on file  . Number of children: Not on file  . Years of education: Not on file  . Highest education level: Not on file  Occupational History  . Not on file  Tobacco Use  . Smoking status: Never Smoker  . Smokeless tobacco: Never Used  Substance and Sexual Activity  . Alcohol use: Not Currently  . Drug use: Not Currently    Comment: Last used 20-30 years ago   . Sexual activity: Not on file  Other Topics Concern  . Not on file  Social History Narrative  . Not on file   Social Determinants of Health   Financial Resource Strain:   . Difficulty of Paying Living Expenses: Not on file   Food Insecurity:   . Worried About Charity fundraiser in the Last Year: Not on file  . Ran Out of Food in the Last Year: Not on file  Transportation Needs:   . Lack of Transportation (Medical): Not on file  . Lack of Transportation (Non-Medical): Not on file  Physical Activity:   . Days of Exercise per Week: Not on file  . Minutes of Exercise per Session: Not on file  Stress:   . Feeling of Stress : Not on file  Social Connections:   . Frequency of Communication with Friends and Family: Not on file  . Frequency of Social Gatherings with Friends and Family: Not on file  . Attends Religious Services: Not on file  . Active Member of Clubs or Organizations: Not on file  . Attends Archivist Meetings: Not on file  . Marital Status: Not on file    His Allergies Are:  No Known Allergies:   His Current Medications Are:  Outpatient Encounter Medications as of 05/02/2020  Medication Sig  . metoprolol tartrate (LOPRESSOR) 50 MG tablet Take 50 mg by mouth 2 (two) times daily.  . simvastatin (ZOCOR) 40 MG tablet Take 40 mg by mouth at bedtime.  . traZODone (DESYREL) 150 MG tablet Take 150-300 mg by mouth at bedtime.   No facility-administered encounter medications on file as of 05/02/2020.  :   Review of Systems:  Out of a complete 14 point review of systems, all are reviewed and negative with the exception of these symptoms as listed below:  Review of Systems  Neurological:       Here to discuss worsening dizziness. Pt reports he noticed an increase in dizzy spells after receiving the second dosage of the moderna vaccine.  He denies any falls.  He does have a cane at home that he uses occasion. He is seeing physical therapy for vertigo, reports during most of his sessions he will get sick to his stomach and vomit.  Pt has tried meclizine in the past but did not help with his sx    Objective:  Neurological Exam  Physical Exam Physical Examination:   There were no vitals  filed for this visit.   He has no orthostatic hypotension or orthostatic dizziness but does report feeling off balance when he stands or turns his head side to side.  General Examination: The patient is a very pleasant 69 y.o. male in no acute distress. He appears well-developed and well-nourished and well groomed.   HEENT: Normocephalic, atraumatic, pupils are equal, round and reactive to light and accommodation.  He may have endpoint nystagmus on the horizontal plane but no nystagmus with sudden change in head position, feels a spinning sensation with side to side head turning.  Tympanic membranes are not visualized secondary to bilateral cerumen impaction.  Hearing is grossly intact.  Face is symmetric with normal facial animation, normal facial sensation to light  touch, temperature and vibration.   Airway examination reveals moderate mouth dryness with thick saliva, tongue protrudes centrally in palate elevates symmetrically, no dysarthria.  No lip, neck or jaw tremor.  Chest: Clear to auscultation without wheezing, rhonchi or crackles noted.  Heart: S1+S2+0, regular and normal without murmurs, rubs or gallops noted.   Abdomen: Soft, non-tender and non-distended with normal bowel sounds appreciated on auscultation.  Extremities: There is no pitting edema in the distal lower extremities bilaterally. Pedal pulses are intact.  Skin: Warm and dry without trophic changes noted. There are no varicose veins.  Musculoskeletal: exam reveals no obvious joint deformities, tenderness or joint swelling or erythema.   Neurologically:  Mental status: The patient is awake, alert and oriented in all 4 spheres. His immediate and remote memory, attention, language skills and fund of knowledge are appropriate. There is no evidence of aphasia, agnosia, apraxia or anomia. Speech is clear with normal prosody and enunciation. Thought process is linear. Mood is normal and affect is normal.  Cranial nerves II - XII  are as described above under HEENT exam. In addition: shoulder shrug is normal with equal shoulder height noted. Motor exam: Normal bulk, strength and tone is noted. There is no drift, or rebound.  Romberg shows swaying.  He feels off balance with his eyes closed.  No resting tremor, no postural or action tremor, slight intention tremor with the left hand.   Reflexes are 1-2+ throughout. Babinski: Toes are flexor bilaterally. Fine motor skills and coordination: Grossly intact with finger taps and foot taps.    Cerebellar testing: No dysmetria or intention tremor on finger to nose testing on the right but mild dysdiadochokinesia with the left hand with finger-to-nose testing and slight intention tremor.  Heel-to-shin is slightly abnormal with the left as well.  Heel-to-shin is good with the right.  Sensory exam: intact to light touch, vibration, temperature sense in the upper and lower extremities.  Gait, station and balance: He stands slowly and stands wide-based.  He did not bring a cane or walker.  He walks slightly wide-based and slowly and slightly in securely, preserved arm swing, no shuffling, tandem walk is not possible for him.  He feels insecure.    Assessment and Plan:   In summary, Rogue Rafalski is a very pleasant 69 y.o.-year old male with an underlying medical history of hypertension, hyperlipidemia, reflux disease, osteoarthritis, insomnia, diabetes, remote history of hemorrhagic stroke, and obesity, who presents for evaluation of his family abrupt onset of dizziness on 01/16/2020.  He has not been evaluated on a more urgent basis at an emergency room but is advised to seek immediate medical attention should he have any sudden escalation of symptoms such as severe nausea vomiting or headache or one-sided weakness or numbness or tingling.  He has not fallen but has come close, feels off balance.  Examination shows some cerebellar dysfunction in the left upper more so than left lower extremity.  He  does not have any significant dysarthria, has mouth dryness, has lost weight and has had intermittent nausea and vomiting.  He does have positional vertigo symptoms, no obvious positional nystagmus but some end gaze nystagmus today.  I could not evaluate his ear canals due to cerumen impaction.  I am not quite sure as to the etiology of his symptoms, a brainstem stroke or cerebellar stroke cannot be excluded.  He had a prior brainstem stroke.  However, from the functional standpoint he did well and went back to work.  He  clearly is impaired in his function currently, could be a combination of things, no obvious or telltale orthostatic symptoms or orthostatic hypotension today.   I would like to proceed with a brain MRI with and without contrast as well as MR angiogram of the head and neck.  I would like to make a referral to ENT to rule out vestibular dysfunction.  He is advised to make a follow-up appointment with your office to see about irrigation of his ear canals as he has obvious cerumen impaction.  It may help with his symptoms.  In addition, I would like to review his recent blood tests.  He is having difficulty keeping even fluids down.  He may be at risk for dehydration.  He is encouraged to try to hydrate as best as possible.  I did not suggest any new medications at this time, we will keep him posted as to his test results as soon as possible.  I plan to see him back after testing.    Thank you very much for allowing me to participate in the care of this nice patient. If I can be of any further assistance to you please do not hesitate to call me at [phone].  Sincerely,   Star Age, MD, PhD

## 2020-05-02 NOTE — Telephone Encounter (Signed)
Medicare/medicaid order sent to GI. No auth they will reach out to the patient to schedule.  °

## 2020-05-07 ENCOUNTER — Telehealth: Payer: Self-pay | Admitting: Neurology

## 2020-05-07 DIAGNOSIS — H6123 Impacted cerumen, bilateral: Secondary | ICD-10-CM | POA: Diagnosis not present

## 2020-05-07 DIAGNOSIS — F418 Other specified anxiety disorders: Secondary | ICD-10-CM | POA: Diagnosis not present

## 2020-05-07 DIAGNOSIS — Z8673 Personal history of transient ischemic attack (TIA), and cerebral infarction without residual deficits: Secondary | ICD-10-CM | POA: Diagnosis not present

## 2020-05-07 DIAGNOSIS — R42 Dizziness and giddiness: Secondary | ICD-10-CM | POA: Diagnosis not present

## 2020-05-07 DIAGNOSIS — Z6830 Body mass index (BMI) 30.0-30.9, adult: Secondary | ICD-10-CM | POA: Diagnosis not present

## 2020-05-07 NOTE — Telephone Encounter (Signed)
I received blood test results from the primary care PA's office, from 05/01/2020.  Glucose was 120, BUN 13, creatinine 0.91, otherwise CMP was unremarkable including normal AST and ALT at 23 and 34 respectively, lipid panel showed total cholesterol of 172, triglycerides 178, HDL 39 and LDL 102.  Nothing further needed.

## 2020-05-09 ENCOUNTER — Ambulatory Visit
Admission: RE | Admit: 2020-05-09 | Discharge: 2020-05-09 | Disposition: A | Discharge status: Home / Self Care | Payer: Medicare Other | Source: Ambulatory Visit | Attending: Neurology | Admitting: Neurology

## 2020-05-09 ENCOUNTER — Other Ambulatory Visit: Payer: Self-pay

## 2020-05-09 ENCOUNTER — Telehealth: Payer: Self-pay | Admitting: Neurology

## 2020-05-09 DIAGNOSIS — H832X3 Labyrinthine dysfunction, bilateral: Secondary | ICD-10-CM

## 2020-05-09 DIAGNOSIS — R42 Dizziness and giddiness: Secondary | ICD-10-CM

## 2020-05-09 DIAGNOSIS — G9340 Encephalopathy, unspecified: Secondary | ICD-10-CM

## 2020-05-09 DIAGNOSIS — R634 Abnormal weight loss: Secondary | ICD-10-CM

## 2020-05-09 DIAGNOSIS — H6123 Impacted cerumen, bilateral: Secondary | ICD-10-CM

## 2020-05-09 DIAGNOSIS — H81399 Other peripheral vertigo, unspecified ear: Secondary | ICD-10-CM

## 2020-05-09 DIAGNOSIS — Z8673 Personal history of transient ischemic attack (TIA), and cerebral infarction without residual deficits: Secondary | ICD-10-CM

## 2020-05-09 DIAGNOSIS — R112 Nausea with vomiting, unspecified: Secondary | ICD-10-CM

## 2020-05-09 DIAGNOSIS — I6781 Acute cerebrovascular insufficiency: Secondary | ICD-10-CM

## 2020-05-09 DIAGNOSIS — D496 Neoplasm of unspecified behavior of brain: Secondary | ICD-10-CM

## 2020-05-09 DIAGNOSIS — R9089 Other abnormal findings on diagnostic imaging of central nervous system: Secondary | ICD-10-CM

## 2020-05-09 DIAGNOSIS — R279 Unspecified lack of coordination: Secondary | ICD-10-CM

## 2020-05-09 DIAGNOSIS — I6782 Cerebral ischemia: Secondary | ICD-10-CM

## 2020-05-09 MED ORDER — GADOBENATE DIMEGLUMINE 529 MG/ML IV SOLN
18.0000 mL | Freq: Once | INTRAVENOUS | Status: AC | PRN
  Administered 2020-05-09: 18 mL via INTRAVENOUS

## 2020-05-09 NOTE — Telephone Encounter (Signed)
I spoke with the patient. I explained that there is an abnormal finding on the brain MRI.  On the positive side, his blood vessels in the head and neck look benign.  Nevertheless, there is an abnormality in the cerebellum on the left side.  Clinically, he feels a little better.  He has been resting more.  He has been able to keep food down and had a good meal today.  He did well during the scans.  He is willing to proceed with further evaluation.  He is advised that I will request urgent evaluations and treatment if possible.  He is aware that there is the possibility of a tumor in the back of his brain.  He does report that the left side of the head and the back does feel sore especially when he presses on it.  He works as a Theme park manager.  He is advised not to climb any ladders or participate in any dangerous activities or climb on roofs.  He is agreeable.  He has a project coming up but will be sitting in a chair and supervising.  He is agreeable to trying to hydrate really well and resting.  He is advised that he will be contacted for an appointment soon.  I have placed a referral to neurosurgery and neuro-oncology for urgent evaluations.  He demonstrated understanding and was appreciative.   Dana: Please expedite referrals.    Megan: Please call patient back as I did not mention the oncology referral to him and only mentioned the neurosurgery referral to him.  I want him to know that we are as proactive as possible and in order to expedite his care, I would like to place simultaneously a referral to neurosurgery at neuro-oncology.

## 2020-05-09 NOTE — Telephone Encounter (Signed)
Urgent Referral's have been sent.   I have talked to [place] at [facility] with urgent referral . I will send via e-mail [email or web address] - Fax machine down . Rollene Fare will reach out to patient to schedule.   I called patient and left him a message stating to please look out for the call [phone].   Sent via [employer] as urgent.  [phone] Att. Seth Bake

## 2020-05-09 NOTE — Telephone Encounter (Signed)
Noted Referrals have been done.

## 2020-05-15 ENCOUNTER — Other Ambulatory Visit: Payer: Self-pay | Admitting: Neurosurgery

## 2020-05-15 DIAGNOSIS — I10 Essential (primary) hypertension: Secondary | ICD-10-CM | POA: Diagnosis not present

## 2020-05-15 DIAGNOSIS — D496 Neoplasm of unspecified behavior of brain: Secondary | ICD-10-CM | POA: Diagnosis not present

## 2020-05-15 DIAGNOSIS — Z6830 Body mass index (BMI) 30.0-30.9, adult: Secondary | ICD-10-CM | POA: Diagnosis not present

## 2020-05-17 ENCOUNTER — Other Ambulatory Visit: Payer: Self-pay | Admitting: Neurosurgery

## 2020-05-26 ENCOUNTER — Other Ambulatory Visit: Payer: Self-pay

## 2020-06-03 NOTE — Progress Notes (Signed)
[id number] - [place], [place] [address] Phone: [phone] Fax: [phone]      Your procedure is scheduled on October 22  Report to [facility] Main Entrance "A" at 0530 A.M., and check in at the Admitting office.  Call this number if you have problems the morning of surgery:  [phone]  Call [phone] if you have any questions prior to your surgery date Monday-Friday 8am-4pm    Remember:  Do not eat or drink after midnight the night before your surgery   Take these medicines the morning of surgery with A SIP OF WATER  metoprolol tartrate (LOPRESSOR)  As of today, STOP taking any Aspirin (unless otherwise instructed by your surgeon) Aleve, Naproxen, Ibuprofen, Motrin, Advil, Goody's, BC's, all herbal medications, fish oil, and all vitamins.                      Do not wear jewelry            Do not wear lotions, powders, colognes, or deodorant.           Men may shave face and neck.            Do not bring valuables to the hospital.            [facility] is not responsible for any belongings or valuables.  Do NOT Smoke (Tobacco/Vaping) or drink Alcohol 24 hours prior to your procedure If you use a CPAP at night, you may bring all equipment for your overnight stay.   Contacts, glasses, dentures or bridgework may not be worn into surgery.      For patients admitted to the hospital, discharge time will be determined by your treatment team.   Patients discharged the day of surgery will not be allowed to drive home, and someone needs to stay with them for 24 hours.    Special instructions:   [facility]- Preparing For Surgery  Before surgery, you can play an important role. Because skin is not sterile, your skin needs to be as free of germs as possible. You can reduce the number of germs on your skin by washing with CHG (chlorahexidine gluconate) Soap before surgery.  CHG is an  antiseptic cleaner which kills germs and bonds with the skin to continue killing germs even after washing.    Oral Hygiene is also important to reduce your risk of infection.  Remember - BRUSH YOUR TEETH THE MORNING OF SURGERY WITH YOUR REGULAR TOOTHPASTE  Please do not use if you have an allergy to CHG or antibacterial soaps. If your skin becomes reddened/irritated stop using the CHG.  Do not shave (including legs and underarms) for at least 48 hours prior to first CHG shower. It is OK to shave your face.  Please follow these instructions carefully.   1. Shower the NIGHT BEFORE SURGERY and the MORNING OF SURGERY with CHG Soap.   2. If you chose to wash your hair, wash your hair first as usual with your normal shampoo.  3. After you shampoo, rinse your hair and body thoroughly to remove the shampoo.  4. Use CHG as you would any other liquid soap. You can apply CHG directly to the skin and wash gently with a scrungie or a clean washcloth.   5. Apply the CHG Soap to your body ONLY FROM THE NECK DOWN.  Do not use on open wounds or open sores.  Avoid contact with your eyes, ears, mouth and genitals (private parts). Wash Face and genitals (private parts)  with your normal soap.   6. Wash thoroughly, paying special attention to the area where your surgery will be performed.  7. Thoroughly rinse your body with warm water from the neck down.  8. DO NOT shower/wash with your normal soap after using and rinsing off the CHG Soap.  9. Pat yourself dry with a CLEAN TOWEL.  10. Wear CLEAN PAJAMAS to bed the night before surgery  11. Place CLEAN SHEETS on your bed the night of your first shower and DO NOT SLEEP WITH PETS.   Day of Surgery: Wear Clean/Comfortable clothing the morning of surgery Do not apply any deodorants/lotions.   Remember to brush your teeth WITH YOUR REGULAR TOOTHPASTE.   Please read over the following fact sheets that you were given.

## 2020-06-04 ENCOUNTER — Other Ambulatory Visit: Payer: Self-pay

## 2020-06-04 ENCOUNTER — Encounter (HOSPITAL_COMMUNITY)
Admission: RE | Admit: 2020-06-04 | Discharge: 2020-06-04 | Disposition: A | Discharge status: Home / Self Care | Payer: Medicare Other | Source: Ambulatory Visit | Attending: Neurosurgery | Admitting: Neurosurgery

## 2020-06-04 ENCOUNTER — Encounter (HOSPITAL_COMMUNITY): Payer: Self-pay

## 2020-06-04 ENCOUNTER — Other Ambulatory Visit (HOSPITAL_COMMUNITY)
Admission: RE | Admit: 2020-06-04 | Discharge: 2020-06-04 | Disposition: A | Discharge status: Home / Self Care | Payer: Medicare Other | Source: Ambulatory Visit | Attending: Neurosurgery | Admitting: Neurosurgery

## 2020-06-04 DIAGNOSIS — R339 Retention of urine, unspecified: Secondary | ICD-10-CM | POA: Diagnosis not present

## 2020-06-04 DIAGNOSIS — Z01818 Encounter for other preprocedural examination: Secondary | ICD-10-CM | POA: Insufficient documentation

## 2020-06-04 DIAGNOSIS — D431 Neoplasm of uncertain behavior of brain, infratentorial: Secondary | ICD-10-CM | POA: Diagnosis not present

## 2020-06-04 DIAGNOSIS — Z20822 Contact with and (suspected) exposure to covid-19: Secondary | ICD-10-CM | POA: Diagnosis not present

## 2020-06-04 DIAGNOSIS — Z01812 Encounter for preprocedural laboratory examination: Secondary | ICD-10-CM | POA: Insufficient documentation

## 2020-06-04 DIAGNOSIS — I10 Essential (primary) hypertension: Secondary | ICD-10-CM | POA: Insufficient documentation

## 2020-06-04 DIAGNOSIS — K219 Gastro-esophageal reflux disease without esophagitis: Secondary | ICD-10-CM | POA: Diagnosis not present

## 2020-06-04 HISTORY — DX: Gastro-esophageal reflux disease without esophagitis: K21.9

## 2020-06-04 HISTORY — DX: Essential (primary) hypertension: I10

## 2020-06-04 LAB — TYPE AND SCREEN
ABO/RH(D): O POS
Antibody Screen: NEGATIVE

## 2020-06-04 LAB — CBC
HCT: 47.4 % (ref 39.0–52.0)
Hemoglobin: 15.5 g/dL (ref 13.0–17.0)
MCH: 30.6 pg (ref 26.0–34.0)
MCHC: 32.7 g/dL (ref 30.0–36.0)
MCV: 93.5 fL (ref 80.0–100.0)
Platelets: 268 10*3/uL (ref 150–400)
RBC: 5.07 MIL/uL (ref 4.22–5.81)
RDW: 13.1 % (ref 11.5–15.5)
WBC: 9.5 10*3/uL (ref 4.0–10.5)
nRBC: 0 % (ref 0.0–0.2)

## 2020-06-04 LAB — SARS CORONAVIRUS 2 (TAT 6-24 HRS): SARS Coronavirus 2: NEGATIVE

## 2020-06-04 NOTE — Progress Notes (Addendum)
PCP:  Cyndi Bender, PA-C Cardiologist:  Denies  EKG:  06/04/20 CXR:  N/A ECHO:  Denies Stress Test:  Denies Cardiac Cath:  Denies  Covid test 06/04/20  Anesthesia Review:  Yes, abnormal EKG  Patient denies shortness of breath, fever, cough, and chest pain at PAT appointment.  Patient verbalized understanding of instructions provided today at the PAT appointment.  Patient asked to review instructions at home and day of surgery.

## 2020-06-05 NOTE — Anesthesia Preprocedure Evaluation (Addendum)
Anesthesia Evaluation  Patient identified by MRN, date of birth, ID band Patient awake    Reviewed: Allergy & Precautions, NPO status , Patient's Chart, lab work & pertinent test results  Airway Mallampati: I  TM Distance: >3 FB Neck ROM: Full    Dental   Pulmonary    Pulmonary exam normal        Cardiovascular hypertension, Pt. on medications Normal cardiovascular exam     Neuro/Psych    GI/Hepatic GERD  Medicated and Controlled,  Endo/Other    Renal/GU      Musculoskeletal   Abdominal   Peds  Hematology   Anesthesia Other Findings   Reproductive/Obstetrics                            Anesthesia Physical Anesthesia Plan  ASA: III  Anesthesia Plan: General   Post-op Pain Management:    Induction: Intravenous  PONV Risk Score and Plan: 2  Airway Management Planned: Oral ETT  Additional Equipment:   Intra-op Plan:   Post-operative Plan: Extubation in OR  Informed Consent: I have reviewed the patients History and Physical, chart, labs and discussed the procedure including the risks, benefits and alternatives for the proposed anesthesia with the patient or authorized representative who has indicated his/her understanding and acceptance.       Plan Discussed with: CRNA and Surgeon  Anesthesia Plan Comments: (BMP not done at PAT appointment.  Indications for lab work reviewed with staff.  BMP ordered for day of surgery.)       Anesthesia Quick Evaluation

## 2020-06-06 NOTE — H&P (Signed)
Chief Complaint   No chief complaint on file.   HPI   HPI: Jose Ramirez is a 69 y.o. 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 While he says he has had some problems with his balance chronically over the last several years this was also significantly and exacerbated since June.   He has also noted some difficulty with fine movements primarily in the left hand.  Patient has not noted any changes in bowel or bladder function.  No specific weakness of the arms or legs.  No facial asymmetry, facial pain, or weakness.  Of note, the patient does not have any oncologic history.  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    He presents today for suboccipital craniectomy for resection of the cerebellar cystic tumor for both decompression and definitive diagnosis.  He is without any concerns.  There are no problems to display for this patient.   PMH: Past Medical History:  Diagnosis Date  . GERD (gastroesophageal reflux disease)   . Hypertension     PSH: Past Surgical History:  Procedure Laterality Date  . NO PAST SURGERIES      No medications prior to admission.    SH: Social History   Tobacco Use  . Smoking status: Never Smoker  . Smokeless tobacco: Never Used  Vaping Use  . Vaping Use: Never used  Substance Use Topics  . Alcohol use: Not Currently  . Drug use: Not Currently    Comment: Last used 20-30 years ago     MEDS: Prior to Admission medications   Medication Sig Start Date End Date Taking? Authorizing Provider  metoprolol tartrate (LOPRESSOR) 100 MG tablet Take 50 mg by mouth 2 (two) times daily.  03/26/20  Yes [provider]  Multiple Vitamin (MULTIVITAMIN WITH MINERALS) TABS tablet Take 1 tablet by mouth daily.   Yes [provider]  Omega-3 Fatty Acids (FISH OIL) 1000 MG CAPS Take 2,000 mg by mouth  in the morning and at bedtime.   Yes [provider]  simvastatin (ZOCOR) 40 MG tablet Take 40 mg by mouth at bedtime. 02/13/20  Yes [provider]  traZODone (DESYREL) 150 MG tablet Take 300 mg by mouth at bedtime.  03/26/20  Yes [provider]    ALLERGY: No Known Allergies  Social History   Tobacco Use  . Smoking status: Never Smoker  . Smokeless tobacco: Never Used  Substance Use Topics  . Alcohol use: Not Currently     No family history on file.   ROS   ROS  Exam   There were no vitals filed for this visit. General appearance: WDWN, NAD Eyes: No scleral injection Cardiovascular: Regular rate and rhythm without murmurs, rubs, gallops. No edema or variciosities. Distal pulses normal. Pulmonary: Effort normal, non-labored breathing Musculoskeletal:     Muscle tone upper extremities: Normal    Muscle tone lower extremities: Normal    Motor exam: Upper Extremities Deltoid Bicep Tricep Grip  Right 5/5 5/5 5/5 5/5  Left 5/5 5/5 5/5 5/5   Lower Extremity IP Quad PF DF EHL  Right 5/5 5/5 5/5 5/5 5/5  Left 5/5 5/5 5/5 5/5 5/5   Neurological Mental Status:    - Patient is awake, alert, oriented to person, place, month, year, and situation    - Patient is able to give a clear  and coherent history.    - No signs of aphasia or neglect Cranial Nerves    - II: Visual Fields are full. PERRL    - III/IV/VI: EOMI without ptosis or diploplia.     - V: Facial sensation is grossly normal    - VII: Facial movement is symmetric.     - VIII: hearing is intact to voice    - X: Uvula elevates symmetrically    - XI: Shoulder shrug is symmetric.    - XII: tongue is midline without atrophy or fasciculations.  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  No results found.  IMAGING: MRI of  the brain with without contrast dated 05/09/2020 was personally reviewed.  This demonstrates a large cystic structure within the left cerebellum with a avidly enhancing mural nodule at the superior aspect of the cyst near the vermis.  There is significant mass effect upon the 4th ventricle.  There does appear to be some mild supratentorial ventriculomegaly.  No other lesions in the brain are identified.  Impression/Plan   69 y.o. 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  Given the clinical symptoms and radiographic mass effect with early ventriculomegaly, surgical resection for relief of mass effect, treatment of hydrocephalus, and pathologic diagnosis is indicated.  We will proceed with suboccipital craniectomy for resection of the cerebellar cystic tumor.  We have reviewed the indications for surgery, the associated risks, benefits and alternatives at length in the office.  All questions today were answered and consent was obtained.  Consuella Lose, MD [facility]

## 2020-06-07 ENCOUNTER — Inpatient Hospital Stay (HOSPITAL_COMMUNITY): Payer: Medicare Other | Admitting: Physician Assistant

## 2020-06-07 ENCOUNTER — Encounter (HOSPITAL_COMMUNITY): Payer: Self-pay | Admitting: Neurosurgery

## 2020-06-07 ENCOUNTER — Encounter (HOSPITAL_COMMUNITY)
Admission: RE | Disposition: A | Discharge status: Skilled Nursing Facility | Payer: Self-pay | Source: Home / Self Care | Attending: Neurosurgery

## 2020-06-07 ENCOUNTER — Other Ambulatory Visit: Payer: Self-pay

## 2020-06-07 ENCOUNTER — Inpatient Hospital Stay (HOSPITAL_COMMUNITY): Payer: Medicare Other

## 2020-06-07 ENCOUNTER — Inpatient Hospital Stay (HOSPITAL_COMMUNITY)
Admission: RE | Admit: 2020-06-07 | Discharge: 2020-06-12 | DRG: 027 | Disposition: A | Discharge status: Skilled Nursing Facility | Payer: Medicare Other | Attending: Neurosurgery | Admitting: Neurosurgery

## 2020-06-07 DIAGNOSIS — R270 Ataxia, unspecified: Secondary | ICD-10-CM | POA: Diagnosis not present

## 2020-06-07 DIAGNOSIS — D431 Neoplasm of uncertain behavior of brain, infratentorial: Secondary | ICD-10-CM | POA: Diagnosis present

## 2020-06-07 DIAGNOSIS — R220 Localized swelling, mass and lump, head: Secondary | ICD-10-CM | POA: Diagnosis not present

## 2020-06-07 DIAGNOSIS — E785 Hyperlipidemia, unspecified: Secondary | ICD-10-CM | POA: Diagnosis not present

## 2020-06-07 DIAGNOSIS — I10 Essential (primary) hypertension: Secondary | ICD-10-CM | POA: Diagnosis present

## 2020-06-07 DIAGNOSIS — R0902 Hypoxemia: Secondary | ICD-10-CM | POA: Diagnosis not present

## 2020-06-07 DIAGNOSIS — Z20822 Contact with and (suspected) exposure to covid-19: Secondary | ICD-10-CM | POA: Diagnosis present

## 2020-06-07 DIAGNOSIS — K219 Gastro-esophageal reflux disease without esophagitis: Secondary | ICD-10-CM | POA: Diagnosis present

## 2020-06-07 DIAGNOSIS — Z7401 Bed confinement status: Secondary | ICD-10-CM | POA: Diagnosis not present

## 2020-06-07 DIAGNOSIS — M6281 Muscle weakness (generalized): Secondary | ICD-10-CM | POA: Diagnosis not present

## 2020-06-07 DIAGNOSIS — D496 Neoplasm of unspecified behavior of brain: Secondary | ICD-10-CM | POA: Diagnosis not present

## 2020-06-07 DIAGNOSIS — M2550 Pain in unspecified joint: Secondary | ICD-10-CM | POA: Diagnosis not present

## 2020-06-07 DIAGNOSIS — G9389 Other specified disorders of brain: Secondary | ICD-10-CM | POA: Diagnosis not present

## 2020-06-07 DIAGNOSIS — R260 Ataxic gait: Secondary | ICD-10-CM | POA: Diagnosis not present

## 2020-06-07 DIAGNOSIS — R279 Unspecified lack of coordination: Secondary | ICD-10-CM | POA: Diagnosis not present

## 2020-06-07 DIAGNOSIS — J3489 Other specified disorders of nose and nasal sinuses: Secondary | ICD-10-CM | POA: Diagnosis not present

## 2020-06-07 DIAGNOSIS — Z48811 Encounter for surgical aftercare following surgery on the nervous system: Secondary | ICD-10-CM | POA: Diagnosis not present

## 2020-06-07 DIAGNOSIS — G319 Degenerative disease of nervous system, unspecified: Secondary | ICD-10-CM | POA: Diagnosis not present

## 2020-06-07 DIAGNOSIS — R339 Retention of urine, unspecified: Secondary | ICD-10-CM | POA: Diagnosis not present

## 2020-06-07 DIAGNOSIS — R54 Age-related physical debility: Secondary | ICD-10-CM | POA: Diagnosis not present

## 2020-06-07 DIAGNOSIS — D432 Neoplasm of uncertain behavior of brain, unspecified: Secondary | ICD-10-CM | POA: Diagnosis present

## 2020-06-07 DIAGNOSIS — J323 Chronic sphenoidal sinusitis: Secondary | ICD-10-CM | POA: Diagnosis not present

## 2020-06-07 DIAGNOSIS — G930 Cerebral cysts: Secondary | ICD-10-CM | POA: Diagnosis not present

## 2020-06-07 HISTORY — PX: CRANIOTOMY: SHX93

## 2020-06-07 LAB — BASIC METABOLIC PANEL
Anion gap: 12 (ref 5–15)
BUN: 16 mg/dL (ref 8–23)
CO2: 24 mmol/L (ref 22–32)
Calcium: 9.2 mg/dL (ref 8.9–10.3)
Chloride: 103 mmol/L (ref 98–111)
Creatinine, Ser: 0.7 mg/dL (ref 0.61–1.24)
GFR, Estimated: >60 mL/min (ref >60)
Glucose, Bld: 162 mg/dL — ABNORMAL HIGH (ref 70–99)
Potassium: 3.7 mmol/L (ref 3.5–5.1)
Sodium: 139 mmol/L (ref 135–145)

## 2020-06-07 LAB — ABO/RH: ABO/RH(D): O POS

## 2020-06-07 LAB — MRSA PCR SCREENING: MRSA by PCR: NEGATIVE

## 2020-06-07 IMAGING — MR MR HEAD WO/W CM
14 of 16 series · 40 of 48 positions shown · IV contrast (gadavist)
Comparison: Brain MRI [DATE]

CLINICAL DATA: Brain mass.  Status post resection.

EXAM:
MRI HEAD WITHOUT AND WITH CONTRAST
TECHNIQUE: Multiplanar, multiecho pulse sequences of the brain and surrounding
structures were obtained without and with intravenous contrast.
CONTRAST:  9mL GADAVIST GADOBUTROL 1 MMOL/ML IV SOLN

[Series 5: DWI · axial · 3.0mm · 0.88mm/px · z∈[-83,+64]mm · 5 of 102 slices shown (1 of 4)]
[im 1/102]
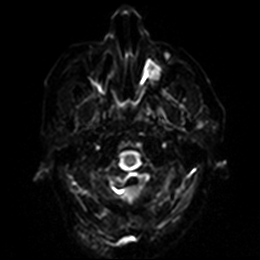
[im 26/102]
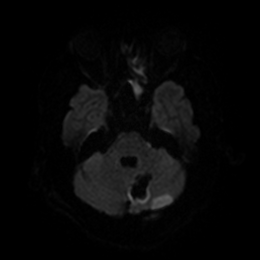
[im 51/102]
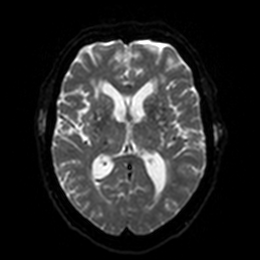
[im 76/102]
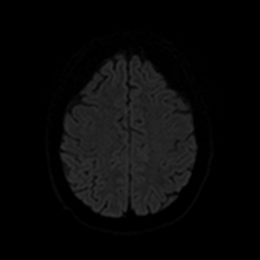
[im 102/102]
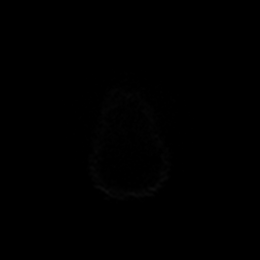

[Series 6: DWI · axial · 3.0mm · 0.88mm/px · z∈[-83,+64]mm · 3 of 51 slices shown (2 of 4)]
[im 1/51]
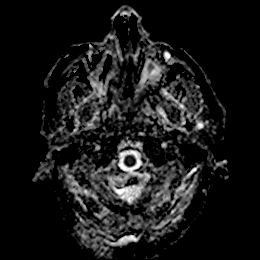
[im 26/51]
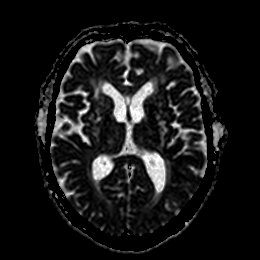
[im 51/51]
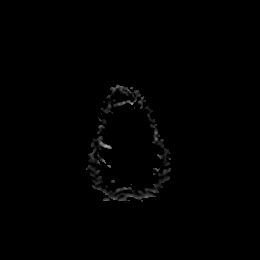

[Series 7: DWI · coronal · 4.0mm · 0.88mm/px · 4 of 70 slices shown (3 of 4)]
[im 1/70]
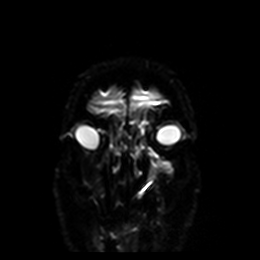
[im 24/70]
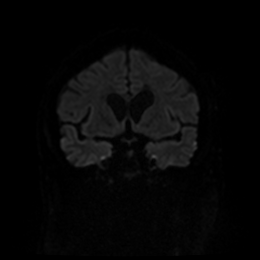
[im 47/70]
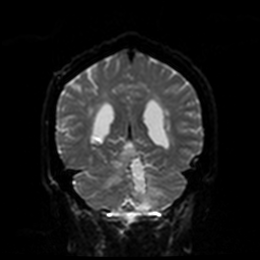
[im 70/70]
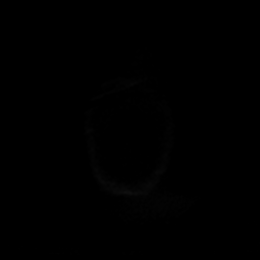

[Series 8: DWI · coronal · 4.0mm · 0.88mm/px · 2 of 35 slices shown (4 of 4)]
[im 1/35]
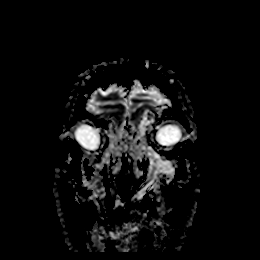
[im 35/35]
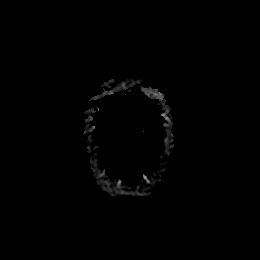

[Series 9: T1 · sagittal · 5.0mm · 0.75mm/px · 2 of 27 slices shown]
[im 1/27]
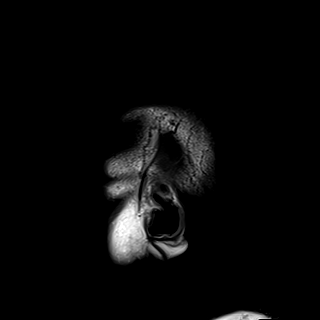
[im 27/27]
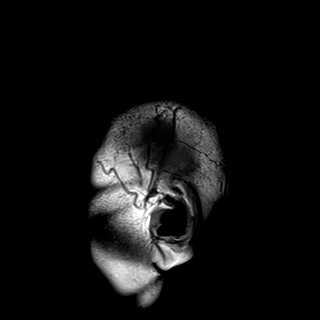

[Series 10: T2 · axial · 5.0mm · 0.72mm/px · z∈[-91,+64]mm · 2 of 27 slices shown]
[im 1/27]
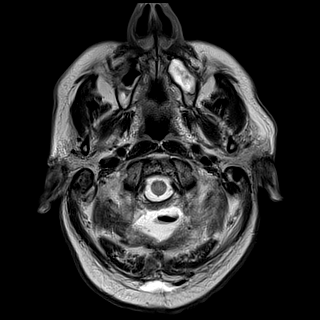
[im 27/27]
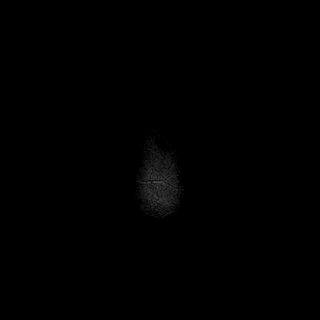

[Series 11: FLAIR · axial · 5.0mm · 0.45mm/px · z∈[-91,+63]mm · 2 of 27 slices shown]
[im 1/27]
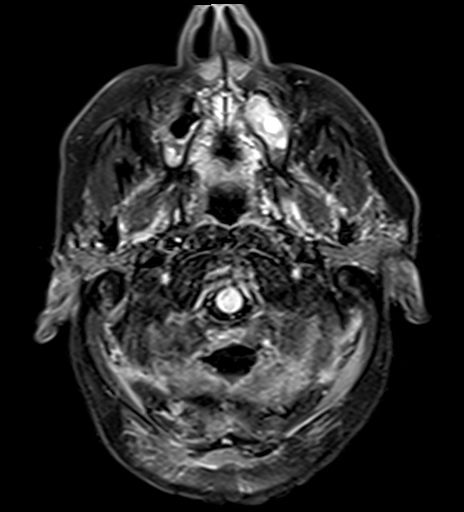
[im 27/27]
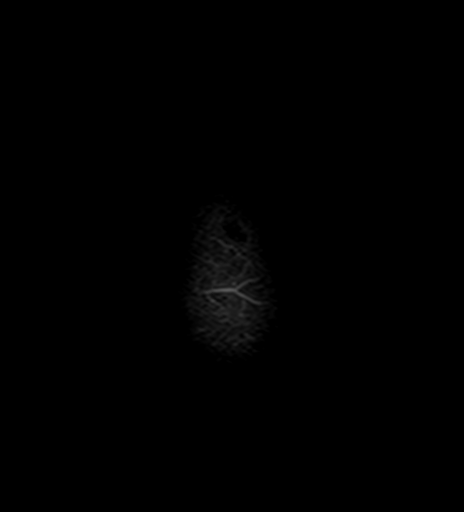

[Series 12: mag_images · axial · 3.0mm · 0.90mm/px · z∈[-96,+68]mm · 4 of 56 slices shown]
[im 1/56]
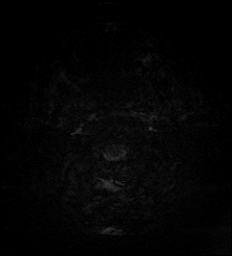
[im 19/56]
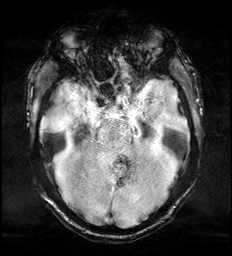
[im 37/56]
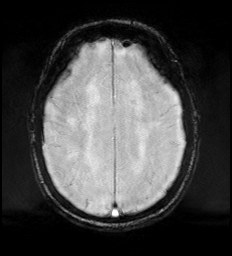
[im 56/56]
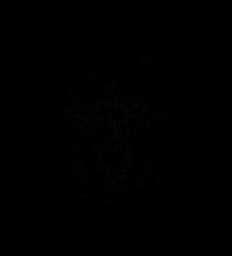

[Series 13: pha_images · axial · 3.0mm · 0.90mm/px · z∈[-93,+59]mm · 3 of 52 slices shown]
[im 1/52]
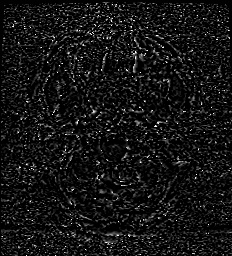
[im 26/52]
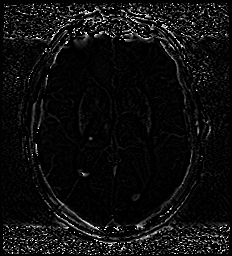
[im 52/52]
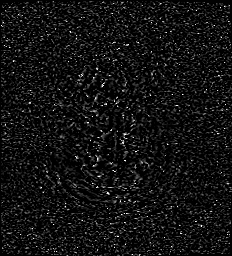

[Series 14: swi_images · axial · 3.0mm · 0.90mm/px · z∈[-96,+68]mm · 4 of 56 slices shown]
[im 1/56]
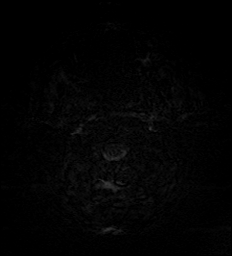
[im 19/56]
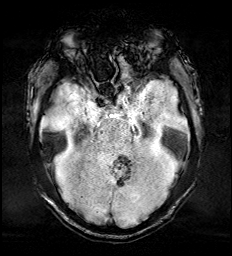
[im 37/56]
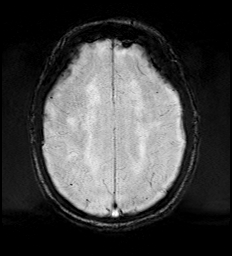
[im 56/56]
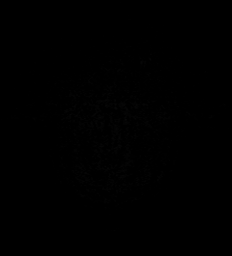

[Series 15: mip_images(sw) · axial · 24.0mm · 0.90mm/px · z∈[-85,+57]mm · 3 of 49 slices shown]
[im 1/49]
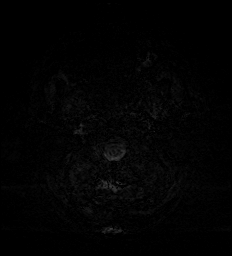
[im 25/49]
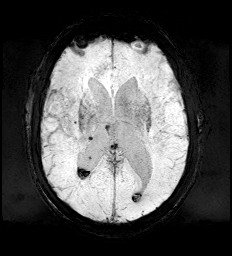
[im 49/49]
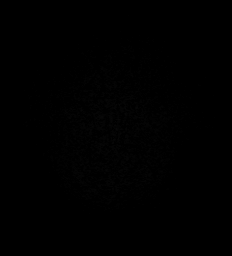

[Series 17: T2 post-contrast · coronal · 5.0mm · 0.72mm/px · 2 of 33 slices shown]
[im 1/33]
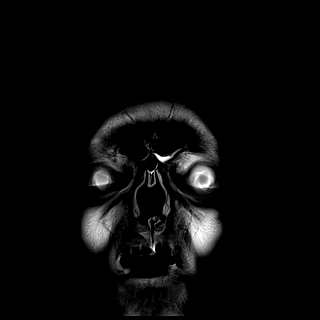
[im 33/33]
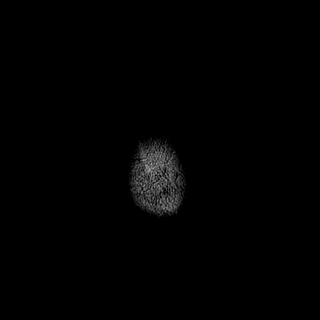

[Series 19: T1 post-contrast · coronal · 5.0mm · 0.34mm/px · 2 of 33 slices shown (1 of 2)]
[im 1/33]
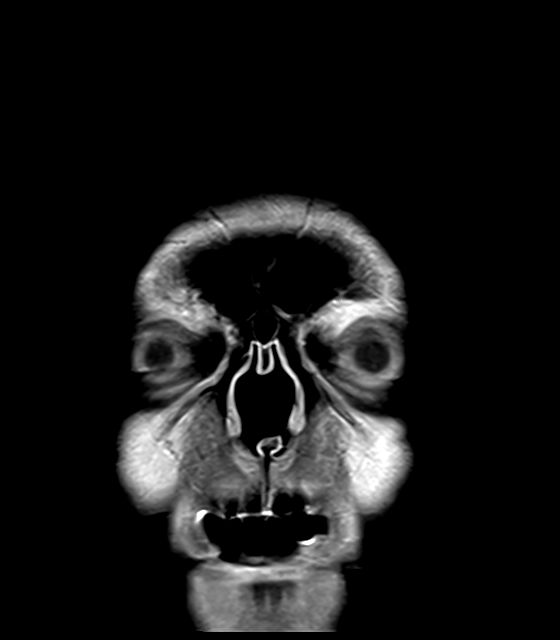
[im 33/33]
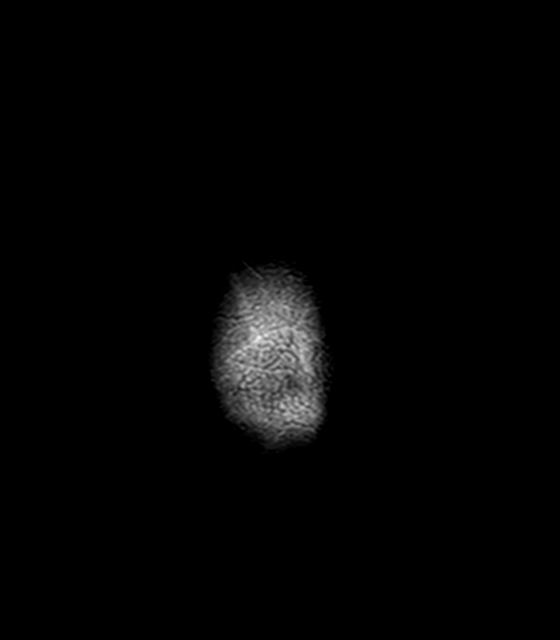

[Series 20: T1 post-contrast · sagittal · 5.0mm · 0.72mm/px · 2 of 27 slices shown (2 of 2)]
[im 1/27]
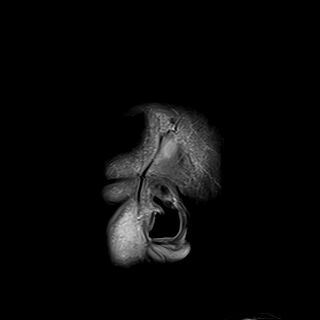
[im 27/27]
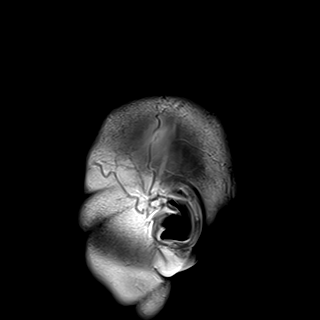

[40 of 48 positions shown; findings below may reference images not displayed]

FINDINGS: Brain: Status post resection left cerebellar mass. There is an area
diffusion restriction lateral and posterior to the resection cavity.
Small amount of blood within the resection cavity. Early confluent
hyperintense T2-weighted signal of the periventricular and deep
white matter. Advanced atrophy for age. Small amount of blood
layering in the lateral ventricles. There is a nodular component of
contrast enhancement at the superior aspect of the resection site
that measures 8 x 7 x 6 mm.

Vascular: Normal flow voids.

Skull and upper cervical spine: Suboccipital craniotomy

Sinuses/Orbits: Left ethmoid, sphenoid and maxillary sinus mucosal
thickening. Normal orbits.

Other: None
IMPRESSION: 1. Status post resection of left cerebellar mass with 8 x 7 x 6 mm
nodular component of contrast enhancement at the superior aspect of
the resection site, likely residual tumor.
2. Advanced atrophy and chronic microvascular disease.

## 2020-06-07 SURGERY — CRANIOTOMY TUMOR EXCISION
Anesthesia: General | Site: Head

## 2020-06-07 MED ORDER — TRAZODONE HCL 150 MG PO TABS
300.0000 mg | ORAL_TABLET | Freq: Every day | ORAL | Status: DC
  Administered 2020-06-07 – 2020-06-11 (×5): 300 mg via ORAL
  Filled 2020-06-07 (×5): qty 2

## 2020-06-07 MED ORDER — GADOBUTROL 1 MMOL/ML IV SOLN
9.0000 mL | Freq: Once | INTRAVENOUS | Status: AC | PRN
  Administered 2020-06-07: 9 mL via INTRAVENOUS

## 2020-06-07 MED ORDER — ONDANSETRON HCL 4 MG/2ML IJ SOLN
4.0000 mg | INTRAMUSCULAR | Status: DC | PRN
  Administered 2020-06-07 – 2020-06-11 (×5): 4 mg via INTRAVENOUS
  Filled 2020-06-07 (×5): qty 2

## 2020-06-07 MED ORDER — LACTATED RINGERS IV SOLN: INTRAVENOUS | Status: DC

## 2020-06-07 MED ORDER — METHOCARBAMOL 1000 MG/10ML IJ SOLN
500.0000 mg | Freq: Four times a day (QID) | INTRAVENOUS | Status: DC | PRN
  Administered 2020-06-07 – 2020-06-09 (×4): 500 mg via INTRAVENOUS
  Filled 2020-06-07 (×5): qty 5

## 2020-06-07 MED ORDER — 0.9 % SODIUM CHLORIDE (POUR BTL) OPTIME
TOPICAL | Status: DC | PRN
  Administered 2020-06-07: 1000 mL
  Administered 2020-06-07 (×2): 2000 mL

## 2020-06-07 MED ORDER — LABETALOL HCL 5 MG/ML IV SOLN
10.0000 mg | INTRAVENOUS | Status: DC | PRN
  Administered 2020-06-07: 20 mg via INTRAVENOUS
  Filled 2020-06-07: qty 4

## 2020-06-07 MED ORDER — ROCURONIUM BROMIDE 10 MG/ML (PF) SYRINGE
PREFILLED_SYRINGE | INTRAVENOUS | Status: DC | PRN
  Administered 2020-06-07: 60 mg via INTRAVENOUS
  Administered 2020-06-07: 40 mg via INTRAVENOUS
  Administered 2020-06-07 (×2): 20 mg via INTRAVENOUS

## 2020-06-07 MED ORDER — PHENYLEPHRINE 40 MCG/ML (10ML) SYRINGE FOR IV PUSH (FOR BLOOD PRESSURE SUPPORT)
PREFILLED_SYRINGE | INTRAVENOUS | Status: DC | PRN
  Administered 2020-06-07 (×2): 120 ug via INTRAVENOUS

## 2020-06-07 MED ORDER — PROPOFOL 10 MG/ML IV BOLUS
INTRAVENOUS | Status: AC
  Filled 2020-06-07: qty 20

## 2020-06-07 MED ORDER — MEPERIDINE HCL 25 MG/ML IJ SOLN: 6.2500 mg | INTRAMUSCULAR | Status: DC | PRN

## 2020-06-07 MED ORDER — BISACODYL 5 MG PO TBEC
5.0000 mg | DELAYED_RELEASE_TABLET | Freq: Every day | ORAL | Status: DC | PRN
  Administered 2020-06-07: 5 mg via ORAL
  Filled 2020-06-07: qty 1

## 2020-06-07 MED ORDER — HYDROMORPHONE HCL 1 MG/ML IJ SOLN
0.5000 mg | INTRAMUSCULAR | Status: DC | PRN
  Administered 2020-06-07 – 2020-06-09 (×11): 1 mg via INTRAVENOUS
  Filled 2020-06-07 (×11): qty 1

## 2020-06-07 MED ORDER — LIDOCAINE 2% (20 MG/ML) 5 ML SYRINGE
INTRAMUSCULAR | Status: DC | PRN
  Administered 2020-06-07: 60 mg via INTRAVENOUS

## 2020-06-07 MED ORDER — ONDANSETRON HCL 4 MG/2ML IJ SOLN
INTRAMUSCULAR | Status: DC | PRN
  Administered 2020-06-07: 4 mg via INTRAVENOUS

## 2020-06-07 MED ORDER — POLYETHYLENE GLYCOL 3350 17 G PO PACK: 17.0000 g | PACK | Freq: Every day | ORAL | Status: DC | PRN

## 2020-06-07 MED ORDER — ORAL CARE MOUTH RINSE: 15.0000 mL | Freq: Once | OROMUCOSAL | Status: AC

## 2020-06-07 MED ORDER — ACETAMINOPHEN 325 MG PO TABS
650.0000 mg | ORAL_TABLET | ORAL | Status: DC | PRN
  Administered 2020-06-07 – 2020-06-09 (×4): 650 mg via ORAL
  Filled 2020-06-07 (×4): qty 2

## 2020-06-07 MED ORDER — ROCURONIUM BROMIDE 10 MG/ML (PF) SYRINGE
PREFILLED_SYRINGE | INTRAVENOUS | Status: AC
  Filled 2020-06-07: qty 10

## 2020-06-07 MED ORDER — SODIUM CHLORIDE 0.9 % IV SOLN: INTRAVENOUS | Status: DC | PRN

## 2020-06-07 MED ORDER — PROMETHAZINE HCL 25 MG/ML IJ SOLN
12.5000 mg | Freq: Four times a day (QID) | INTRAMUSCULAR | Status: DC | PRN
  Administered 2020-06-07 – 2020-06-10 (×4): 12.5 mg via INTRAVENOUS
  Filled 2020-06-07 (×6): qty 1

## 2020-06-07 MED ORDER — THROMBIN 20000 UNITS EX SOLR
CUTANEOUS | Status: DC | PRN
  Administered 2020-06-07: 20 mL

## 2020-06-07 MED ORDER — LIDOCAINE-EPINEPHRINE 1 %-1:100000 IJ SOLN
INTRAMUSCULAR | Status: DC | PRN
  Administered 2020-06-07: 5 mL

## 2020-06-07 MED ORDER — THROMBIN 5000 UNITS EX SOLR
OROMUCOSAL | Status: DC | PRN
  Administered 2020-06-07: 5 mL

## 2020-06-07 MED ORDER — PROMETHAZINE HCL 12.5 MG PO TABS
12.5000 mg | ORAL_TABLET | ORAL | Status: DC | PRN
  Filled 2020-06-07: qty 2

## 2020-06-07 MED ORDER — CHLORHEXIDINE GLUCONATE CLOTH 2 % EX PADS
6.0000 | MEDICATED_PAD | Freq: Every day | CUTANEOUS | Status: DC
  Administered 2020-06-07 – 2020-06-12 (×4): 6 via TOPICAL

## 2020-06-07 MED ORDER — FLEET ENEMA 7-19 GM/118ML RE ENEM: 1.0000 | ENEMA | Freq: Once | RECTAL | Status: DC | PRN

## 2020-06-07 MED ORDER — PANTOPRAZOLE SODIUM 40 MG IV SOLR
40.0000 mg | Freq: Every day | INTRAVENOUS | Status: DC
  Administered 2020-06-07 – 2020-06-11 (×5): 40 mg via INTRAVENOUS
  Filled 2020-06-07 (×6): qty 40

## 2020-06-07 MED ORDER — ADULT MULTIVITAMIN W/MINERALS CH
1.0000 | ORAL_TABLET | Freq: Every day | ORAL | Status: DC
  Administered 2020-06-08 – 2020-06-12 (×5): 1 via ORAL
  Filled 2020-06-07 (×5): qty 1

## 2020-06-07 MED ORDER — THROMBIN 20000 UNITS EX SOLR
CUTANEOUS | Status: AC
  Filled 2020-06-07: qty 20000

## 2020-06-07 MED ORDER — CHLORHEXIDINE GLUCONATE CLOTH 2 % EX PADS: 6.0000 | MEDICATED_PAD | Freq: Once | CUTANEOUS | Status: DC

## 2020-06-07 MED ORDER — ARTIFICIAL TEARS OPHTHALMIC OINT
TOPICAL_OINTMENT | OPHTHALMIC | Status: DC | PRN
  Administered 2020-06-07: 1 via OPHTHALMIC

## 2020-06-07 MED ORDER — BUPIVACAINE HCL (PF) 0.5 % IJ SOLN
INTRAMUSCULAR | Status: DC | PRN
  Administered 2020-06-07: 5 mL

## 2020-06-07 MED ORDER — BACITRACIN ZINC 500 UNIT/GM EX OINT
TOPICAL_OINTMENT | CUTANEOUS | Status: AC
  Filled 2020-06-07: qty 28.35

## 2020-06-07 MED ORDER — ACETAMINOPHEN 650 MG RE SUPP: 650.0000 mg | RECTAL | Status: DC | PRN

## 2020-06-07 MED ORDER — SENNA 8.6 MG PO TABS
1.0000 | ORAL_TABLET | Freq: Two times a day (BID) | ORAL | Status: DC
  Administered 2020-06-07 – 2020-06-12 (×10): 8.6 mg via ORAL
  Filled 2020-06-07 (×9): qty 1

## 2020-06-07 MED ORDER — HYDROMORPHONE HCL 1 MG/ML IJ SOLN
INTRAMUSCULAR | Status: AC
  Filled 2020-06-07: qty 1

## 2020-06-07 MED ORDER — CEFAZOLIN SODIUM-DEXTROSE 2-4 GM/100ML-% IV SOLN
2.0000 g | INTRAVENOUS | Status: AC
  Administered 2020-06-07 (×2): 2 g via INTRAVENOUS
  Filled 2020-06-07: qty 100

## 2020-06-07 MED ORDER — HYDROMORPHONE HCL 1 MG/ML IJ SOLN
INTRAMUSCULAR | Status: AC
Start: 2020-06-07 — End: 2020-06-08
  Filled 2020-06-07: qty 1

## 2020-06-07 MED ORDER — HYDROMORPHONE HCL 1 MG/ML IJ SOLN
0.2500 mg | INTRAMUSCULAR | Status: DC | PRN
  Administered 2020-06-07 (×4): 0.5 mg via INTRAVENOUS

## 2020-06-07 MED ORDER — DEXAMETHASONE SODIUM PHOSPHATE 10 MG/ML IJ SOLN
INTRAMUSCULAR | Status: AC
  Filled 2020-06-07: qty 1

## 2020-06-07 MED ORDER — BUPIVACAINE HCL (PF) 0.5 % IJ SOLN
INTRAMUSCULAR | Status: AC
  Filled 2020-06-07: qty 30

## 2020-06-07 MED ORDER — FENTANYL CITRATE (PF) 250 MCG/5ML IJ SOLN
INTRAMUSCULAR | Status: AC
  Filled 2020-06-07: qty 5

## 2020-06-07 MED ORDER — CEFAZOLIN SODIUM-DEXTROSE 2-4 GM/100ML-% IV SOLN
2.0000 g | Freq: Three times a day (TID) | INTRAVENOUS | Status: AC
  Administered 2020-06-07 – 2020-06-08 (×2): 2 g via INTRAVENOUS
  Filled 2020-06-07 (×3): qty 100

## 2020-06-07 MED ORDER — BACITRACIN ZINC 500 UNIT/GM EX OINT
TOPICAL_OINTMENT | CUTANEOUS | Status: DC | PRN
  Administered 2020-06-07: 1 via TOPICAL

## 2020-06-07 MED ORDER — THROMBIN 5000 UNITS EX SOLR
CUTANEOUS | Status: AC
  Filled 2020-06-07: qty 5000

## 2020-06-07 MED ORDER — MIDAZOLAM HCL 2 MG/2ML IJ SOLN
INTRAMUSCULAR | Status: DC | PRN
  Administered 2020-06-07: 2 mg via INTRAVENOUS

## 2020-06-07 MED ORDER — METOPROLOL TARTRATE 50 MG PO TABS
50.0000 mg | ORAL_TABLET | Freq: Two times a day (BID) | ORAL | Status: DC
  Administered 2020-06-07 – 2020-06-12 (×10): 50 mg via ORAL
  Filled 2020-06-07 (×11): qty 1

## 2020-06-07 MED ORDER — LIDOCAINE 2% (20 MG/ML) 5 ML SYRINGE
INTRAMUSCULAR | Status: AC
  Filled 2020-06-07: qty 5

## 2020-06-07 MED ORDER — SIMVASTATIN 20 MG PO TABS
40.0000 mg | ORAL_TABLET | Freq: Every day | ORAL | Status: DC
  Administered 2020-06-07 – 2020-06-11 (×5): 40 mg via ORAL
  Filled 2020-06-07 (×6): qty 2

## 2020-06-07 MED ORDER — PHENYLEPHRINE 40 MCG/ML (10ML) SYRINGE FOR IV PUSH (FOR BLOOD PRESSURE SUPPORT)
PREFILLED_SYRINGE | INTRAVENOUS | Status: AC
  Filled 2020-06-07: qty 10

## 2020-06-07 MED ORDER — SODIUM CHLORIDE 0.9 % IV SOLN: INTRAVENOUS | Status: DC

## 2020-06-07 MED ORDER — DEXAMETHASONE SODIUM PHOSPHATE 10 MG/ML IJ SOLN
INTRAMUSCULAR | Status: DC | PRN
  Administered 2020-06-07: 10 mg via INTRAVENOUS

## 2020-06-07 MED ORDER — SUGAMMADEX SODIUM 200 MG/2ML IV SOLN
INTRAVENOUS | Status: DC | PRN
  Administered 2020-06-07: 200 mg via INTRAVENOUS

## 2020-06-07 MED ORDER — PHENOL 1.4 % MT LIQD
1.0000 | OROMUCOSAL | Status: DC | PRN
  Filled 2020-06-07: qty 177

## 2020-06-07 MED ORDER — ONDANSETRON HCL 4 MG PO TABS: 4.0000 mg | ORAL_TABLET | ORAL | Status: DC | PRN

## 2020-06-07 MED ORDER — ALBUMIN HUMAN 5 % IV SOLN: INTRAVENOUS | Status: DC | PRN

## 2020-06-07 MED ORDER — ARTIFICIAL TEARS OPHTHALMIC OINT
TOPICAL_OINTMENT | OPHTHALMIC | Status: AC
  Filled 2020-06-07: qty 3.5

## 2020-06-07 MED ORDER — LEVETIRACETAM IN NACL 500 MG/100ML IV SOLN
500.0000 mg | Freq: Two times a day (BID) | INTRAVENOUS | Status: DC
  Administered 2020-06-07 – 2020-06-12 (×11): 500 mg via INTRAVENOUS
  Filled 2020-06-07 (×11): qty 100

## 2020-06-07 MED ORDER — MIDAZOLAM HCL 2 MG/2ML IJ SOLN
INTRAMUSCULAR | Status: AC
  Filled 2020-06-07: qty 2

## 2020-06-07 MED ORDER — HYDROCODONE-ACETAMINOPHEN 5-325 MG PO TABS
1.0000 | ORAL_TABLET | ORAL | Status: DC | PRN
  Administered 2020-06-07 – 2020-06-12 (×17): 1 via ORAL
  Filled 2020-06-07 (×17): qty 1

## 2020-06-07 MED ORDER — LIDOCAINE-EPINEPHRINE 1 %-1:100000 IJ SOLN
INTRAMUSCULAR | Status: AC
  Filled 2020-06-07: qty 1

## 2020-06-07 MED ORDER — NALOXONE HCL 0.4 MG/ML IJ SOLN: 0.0800 mg | INTRAMUSCULAR | Status: DC | PRN

## 2020-06-07 MED ORDER — FENTANYL CITRATE (PF) 250 MCG/5ML IJ SOLN
INTRAMUSCULAR | Status: DC | PRN
Start: 2020-06-07 — End: 2020-06-07
  Administered 2020-06-07: 100 ug via INTRAVENOUS
  Administered 2020-06-07 (×3): 50 ug via INTRAVENOUS

## 2020-06-07 MED ORDER — PROPOFOL 10 MG/ML IV BOLUS
INTRAVENOUS | Status: DC | PRN
  Administered 2020-06-07: 50 mg via INTRAVENOUS
  Administered 2020-06-07 (×2): 30 mg via INTRAVENOUS
  Administered 2020-06-07: 150 mg via INTRAVENOUS

## 2020-06-07 MED ORDER — CHLORHEXIDINE GLUCONATE 0.12 % MT SOLN
15.0000 mL | Freq: Once | OROMUCOSAL | Status: AC
  Administered 2020-06-07: 15 mL via OROMUCOSAL
  Filled 2020-06-07: qty 15

## 2020-06-07 MED ORDER — PHENYLEPHRINE HCL-NACL 10-0.9 MG/250ML-% IV SOLN
INTRAVENOUS | Status: DC | PRN
  Administered 2020-06-07: 40 ug/min via INTRAVENOUS

## 2020-06-07 MED ORDER — HEMOSTATIC AGENTS (NO CHARGE) OPTIME
TOPICAL | Status: DC | PRN
  Administered 2020-06-07: 1

## 2020-06-07 MED ORDER — ONDANSETRON HCL 4 MG/2ML IJ SOLN
INTRAMUSCULAR | Status: AC
  Filled 2020-06-07: qty 2

## 2020-06-07 MED ORDER — ONDANSETRON HCL 4 MG/2ML IJ SOLN: 4.0000 mg | Freq: Once | INTRAMUSCULAR | Status: DC | PRN

## 2020-06-07 SURGICAL SUPPLY — 103 items
ADH SKN CLS APL DERMABOND .7 (GAUZE/BANDAGES/DRESSINGS) ×1
APL SKNCLS STERI-STRIP NONHPOA (GAUZE/BANDAGES/DRESSINGS)
BAND INSRT 18 STRL LF DISP RB (MISCELLANEOUS)
BAND RUBBER #18 3X1/16 STRL (MISCELLANEOUS) IMPLANT
BENZOIN TINCTURE PRP APPL 2/3 (GAUZE/BANDAGES/DRESSINGS) IMPLANT
BLADE CLIPPER SURG (BLADE) ×2 IMPLANT
BLADE SAW GIGLI 16 STRL (MISCELLANEOUS) IMPLANT
BLADE SURG 15 STRL LF DISP TIS (BLADE) IMPLANT
BLADE SURG 15 STRL SS (BLADE)
BLADE ULTRA TIP 2M (BLADE) ×2 IMPLANT
BNDG CMPR 75X41 PLY HI ABS (GAUZE/BANDAGES/DRESSINGS)
BNDG GAUZE ELAST 4 BULKY (GAUZE/BANDAGES/DRESSINGS) IMPLANT
BNDG STRETCH 4X75 STRL LF (GAUZE/BANDAGES/DRESSINGS) IMPLANT
BUR ACORN 6.0 PRECISION (BURR) ×2 IMPLANT
BUR PRECISION FLUTE 5.0 (BURR) ×1 IMPLANT
BUR ROUND FLUTED 4 SOFT TCH (BURR) IMPLANT
BUR SPIRAL ROUTER 2.3 (BUR) ×2 IMPLANT
CANISTER SUCT 3000ML PPV (MISCELLANEOUS) ×4 IMPLANT
CARTRIDGE OIL MAESTRO DRILL (MISCELLANEOUS) ×1 IMPLANT
CATH VENTRIC 35X38 W/TROCAR LG (CATHETERS) IMPLANT
CLIP VESOCCLUDE MED 6/CT (CLIP) IMPLANT
CNTNR URN SCR LID CUP LEK RST (MISCELLANEOUS) ×1 IMPLANT
CONT SPEC 4OZ STRL OR WHT (MISCELLANEOUS) ×2
COVER MAYO STAND STRL (DRAPES) IMPLANT
COVER WAND RF STERILE (DRAPES) ×2 IMPLANT
DECANTER SPIKE VIAL GLASS SM (MISCELLANEOUS) ×2 IMPLANT
DERMABOND ADVANCED (GAUZE/BANDAGES/DRESSINGS) ×1
DERMABOND ADVANCED .7 DNX12 (GAUZE/BANDAGES/DRESSINGS) IMPLANT
DIFFUSER DRILL AIR PNEUMATIC (MISCELLANEOUS) ×2 IMPLANT
DRAIN SUBARACHNOID (WOUND CARE) IMPLANT
DRAPE HALF SHEET 40X57 (DRAPES) ×2 IMPLANT
DRAPE MICROSCOPE LEICA (MISCELLANEOUS) IMPLANT
DRAPE NEUROLOGICAL W/INCISE (DRAPES) ×2 IMPLANT
DRAPE STERI IOBAN 125X83 (DRAPES) IMPLANT
DRAPE SURG 17X23 STRL (DRAPES) IMPLANT
DRAPE WARM FLUID 44X44 (DRAPES) ×2 IMPLANT
DRSG ADAPTIC 3X8 NADH LF (GAUZE/BANDAGES/DRESSINGS) IMPLANT
DRSG OPSITE POSTOP 4X6 (GAUZE/BANDAGES/DRESSINGS) ×1 IMPLANT
DRSG TELFA 3X8 NADH (GAUZE/BANDAGES/DRESSINGS) IMPLANT
DURAGUARD 04CMX04CM ×1 IMPLANT
DURAPREP 6ML APPLICATOR 50/CS (WOUND CARE) ×2 IMPLANT
ELECT REM PT RETURN 9FT ADLT (ELECTROSURGICAL) ×2
ELECTRODE REM PT RTRN 9FT ADLT (ELECTROSURGICAL) ×1 IMPLANT
EVACUATOR 1/8 PVC DRAIN (DRAIN) IMPLANT
EVACUATOR SILICONE 100CC (DRAIN) IMPLANT
FORCEPS BIPOLAR SPETZLER 8 1.0 (NEUROSURGERY SUPPLIES) ×2 IMPLANT
GAUZE 4X4 16PLY RFD (DISPOSABLE) IMPLANT
GAUZE SPONGE 4X4 12PLY STRL (GAUZE/BANDAGES/DRESSINGS) ×2 IMPLANT
GLOVE BIO SURGEON STRL SZ7.5 (GLOVE) IMPLANT
GLOVE BIOGEL PI IND STRL 7.5 (GLOVE) ×2 IMPLANT
GLOVE BIOGEL PI INDICATOR 7.5 (GLOVE) ×2
GLOVE ECLIPSE 7.0 STRL STRAW (GLOVE) ×4 IMPLANT
GLOVE EXAM NITRILE XL STR (GLOVE) IMPLANT
GOWN STRL REUS W/ TWL LRG LVL3 (GOWN DISPOSABLE) ×2 IMPLANT
GOWN STRL REUS W/ TWL XL LVL3 (GOWN DISPOSABLE) IMPLANT
GOWN STRL REUS W/TWL 2XL LVL3 (GOWN DISPOSABLE) IMPLANT
GOWN STRL REUS W/TWL LRG LVL3 (GOWN DISPOSABLE) ×4
GOWN STRL REUS W/TWL XL LVL3 (GOWN DISPOSABLE)
HEMOSTAT POWDER KIT SURGIFOAM (HEMOSTASIS) ×2 IMPLANT
HEMOSTAT SURGICEL 2X14 (HEMOSTASIS) ×2 IMPLANT
HOOK DURA 1/2IN (MISCELLANEOUS) ×2 IMPLANT
IV NS 1000ML (IV SOLUTION) ×2
IV NS 1000ML BAXH (IV SOLUTION) ×1 IMPLANT
KIT BASIN OR (CUSTOM PROCEDURE TRAY) ×2 IMPLANT
KIT DRAIN CSF ACCUDRAIN (MISCELLANEOUS) IMPLANT
KIT TURNOVER KIT B (KITS) ×2 IMPLANT
KNIFE ARACHNOID DISP AM-24-S (MISCELLANEOUS) ×2 IMPLANT
NDL SPNL 18GX3.5 QUINCKE PK (NEEDLE) IMPLANT
NEEDLE HYPO 22GX1.5 SAFETY (NEEDLE) ×2 IMPLANT
NEEDLE SPNL 18GX3.5 QUINCKE PK (NEEDLE) IMPLANT
NS IRRIG 1000ML POUR BTL (IV SOLUTION) ×8 IMPLANT
OIL CARTRIDGE MAESTRO DRILL (MISCELLANEOUS) ×2
PACK CRANIOTOMY CUSTOM (CUSTOM PROCEDURE TRAY) ×2 IMPLANT
PAD DRESSING TELFA 3X8 NADH (GAUZE/BANDAGES/DRESSINGS) IMPLANT
PATTIES SURGICAL .25X.25 (GAUZE/BANDAGES/DRESSINGS) ×1 IMPLANT
PATTIES SURGICAL .5 X.5 (GAUZE/BANDAGES/DRESSINGS) ×1 IMPLANT
PATTIES SURGICAL .5 X3 (DISPOSABLE) IMPLANT
PATTIES SURGICAL 1/4 X 3 (GAUZE/BANDAGES/DRESSINGS) IMPLANT
PATTIES SURGICAL 1X1 (DISPOSABLE) IMPLANT
PIN MAYFIELD SKULL DISP (PIN) ×2 IMPLANT
SEALANT ADHERUS EXTEND TIP (MISCELLANEOUS) ×1 IMPLANT
SPECIMEN JAR SMALL (MISCELLANEOUS) IMPLANT
SPONGE LAP 4X18 RFD (DISPOSABLE) ×1 IMPLANT
SPONGE NEURO XRAY DETECT 1X3 (DISPOSABLE) IMPLANT
SPONGE SURGIFOAM ABS GEL 100 (HEMOSTASIS) ×2 IMPLANT
STAPLER VISISTAT 35W (STAPLE) ×2 IMPLANT
STOCKINETTE 6  STRL (DRAPES)
STOCKINETTE 6 STRL (DRAPES) IMPLANT
SUT ETHILON 3 0 FSL (SUTURE) IMPLANT
SUT ETHILON 3 0 PS 1 (SUTURE) IMPLANT
SUT NURALON 4 0 TR CR/8 (SUTURE) ×6 IMPLANT
SUT SILK 0 TIES 10X30 (SUTURE) IMPLANT
SUT VIC AB 0 CT1 18XCR BRD8 (SUTURE) ×2 IMPLANT
SUT VIC AB 0 CT1 8-18 (SUTURE) ×6
SUT VIC AB 3-0 SH 8-18 (SUTURE) ×4 IMPLANT
SUT VICRYL 3-0 RB1 18 ABS (SUTURE) ×2 IMPLANT
TAPE CLOTH 1X10 TAN NS (GAUZE/BANDAGES/DRESSINGS) ×2 IMPLANT
TOWEL GREEN STERILE (TOWEL DISPOSABLE) ×2 IMPLANT
TOWEL GREEN STERILE FF (TOWEL DISPOSABLE) ×2 IMPLANT
TRAY FOLEY MTR SLVR 16FR STAT (SET/KITS/TRAYS/PACK) ×2 IMPLANT
TUBE CONNECTING 12X1/4 (SUCTIONS) ×2 IMPLANT
UNDERPAD 30X36 HEAVY ABSORB (UNDERPADS AND DIAPERS) ×2 IMPLANT
WATER STERILE IRR 1000ML POUR (IV SOLUTION) ×2 IMPLANT

## 2020-06-07 NOTE — Plan of Care (Signed)
SpO2 95% on RA.

## 2020-06-07 NOTE — Anesthesia Procedure Notes (Signed)
Arterial Line Insertion Start/End10/22/2021 7:15 AM, 06/07/2020 7:21 AM Performed by: Dorthea Cove, CRNA, CRNA  Patient location: Pre-op. Preanesthetic checklist: patient identified, IV checked, site marked, risks and benefits discussed, surgical consent, monitors and equipment checked, pre-op evaluation, timeout performed and anesthesia consent Lidocaine 1% used for infiltration Left, radial was placed Catheter size: 20 Fr Hand hygiene performed  and maximum sterile barriers used   Attempts: 1 Procedure performed without using ultrasound guided technique. Following insertion, dressing applied and Biopatch. Post procedure assessment: normal and unchanged  Patient tolerated the procedure well with no immediate complications.

## 2020-06-07 NOTE — Transfer of Care (Signed)
Immediate Anesthesia Transfer of Care Note  Patient: Jose Ramirez  Procedure(s) Performed: SUBOCCIPITAL CRANIECTOMY FOR RESECTION OF CEREBELLAR TUMOR (N/A Head)  Patient Location: PACU  Anesthesia Type:General  Level of Consciousness: awake, alert  and oriented  Airway & Oxygen Therapy: Patient Spontanous Breathing and Patient connected to face mask oxygen  Post-op Assessment: Report given to RN and Post -op Vital signs reviewed and stable  Post vital signs: Reviewed and stable  Last Vitals:  Vitals Value Taken Time  BP 135/68 06/07/20 1236  Temp 36.1 C 06/07/20 1236  Pulse 92 06/07/20 1237  Resp 15 06/07/20 1237  SpO2 98 % 06/07/20 1237  Vitals shown include unvalidated device data.  Last Pain:  Vitals:   06/07/20 0613  TempSrc:   PainSc: 0-No pain         Complications: No complications documented.

## 2020-06-07 NOTE — Anesthesia Procedure Notes (Signed)
Procedure Name: Intubation Date/Time: 06/07/2020 7:51 AM Performed by: Dorthea Cove, CRNA Pre-anesthesia Checklist: Patient identified, Emergency Drugs available, Suction available and Patient being monitored Patient Re-evaluated:Patient Re-evaluated prior to induction Oxygen Delivery Method: Circle system utilized Preoxygenation: Pre-oxygenation with 100% oxygen Induction Type: IV induction Ventilation: Mask ventilation without difficulty and Oral airway inserted - appropriate to patient size Laryngoscope Size: Mac and 3 Grade View: Grade I Tube type: Oral Tube size: 7.5 mm Number of attempts: 1 Airway Equipment and Method: Stylet and Oral airway Placement Confirmation: ETT inserted through vocal cords under direct vision,  positive ETCO2 and breath sounds checked- equal and bilateral Secured at: 23 cm Tube secured with: Tape Dental Injury: Teeth and Oropharynx as per pre-operative assessment

## 2020-06-07 NOTE — Anesthesia Postprocedure Evaluation (Signed)
Anesthesia Post Note  Patient: Burton Apley  Procedure(s) Performed: SUBOCCIPITAL CRANIECTOMY FOR RESECTION OF CEREBELLAR TUMOR (N/A Head)     Patient location during evaluation: PACU Anesthesia Type: General Level of consciousness: awake and alert Pain management: pain level controlled Vital Signs Assessment: post-procedure vital signs reviewed and stable Respiratory status: spontaneous breathing, nonlabored ventilation, respiratory function stable and patient connected to nasal cannula oxygen Cardiovascular status: blood pressure returned to baseline and stable Postop Assessment: no apparent nausea or vomiting Anesthetic complications: no   No complications documented.  Last Vitals:  Vitals:   06/07/20 1319 06/07/20 1400  BP:  (!) 146/87  Pulse: 96 (!) 104  Resp: (!) 21 17  Temp: 36.7 C   SpO2: 96% (!) 83%    Last Pain:  Vitals:   06/07/20 1400  TempSrc:   PainSc: 10-Worst pain ever                 Elira Colasanti DAVID

## 2020-06-08 LAB — BASIC METABOLIC PANEL
Anion gap: 11 (ref 5–15)
BUN: 9 mg/dL (ref 8–23)
CO2: 26 mmol/L (ref 22–32)
Calcium: 8.8 mg/dL — ABNORMAL LOW (ref 8.9–10.3)
Chloride: 100 mmol/L (ref 98–111)
Creatinine, Ser: 0.66 mg/dL (ref 0.61–1.24)
GFR, Estimated: >60 mL/min (ref >60)
Glucose, Bld: 146 mg/dL — ABNORMAL HIGH (ref 70–99)
Potassium: 3.9 mmol/L (ref 3.5–5.1)
Sodium: 137 mmol/L (ref 135–145)

## 2020-06-08 LAB — CBC
HCT: 40 % (ref 39.0–52.0)
Hemoglobin: 13.4 g/dL (ref 13.0–17.0)
MCH: 31 pg (ref 26.0–34.0)
MCHC: 33.5 g/dL (ref 30.0–36.0)
MCV: 92.6 fL (ref 80.0–100.0)
Platelets: 251 10*3/uL (ref 150–400)
RBC: 4.32 MIL/uL (ref 4.22–5.81)
RDW: 13 % (ref 11.5–15.5)
WBC: 19.5 10*3/uL — ABNORMAL HIGH (ref 4.0–10.5)
nRBC: 0 % (ref 0.0–0.2)

## 2020-06-08 NOTE — Evaluation (Signed)
Physical Therapy Evaluation Patient Details Name: Jose Ramirez MRN: [id number] DOB: Mar 13, 1951 Today's Date: 06/08/2020   History of Present Illness  The pt is a 69 yo male presenting s/p suboccipital craniectomy for resection of the cerebellar cystic tumor on 10/22 due to ongoing balance dysfunction and vertigo. PMH includes: HTN and GERD.   Clinical Impression  Pt in bed upon arrival of PT, agreeable to evaluation at this time. Prior to admission the pt reports he was independent with use of a RW or cane for mobility and independent with all ADLs, driving, and living alone. He reports no falls, but multiple losses of balance recently. The pt now presents with limitations in functional mobility, coordination, fine motor movements, and dynamic stability due to above dx, and will continue to benefit from skilled PT to address these deficits. The pt was able to complete bed mobility with minA but requires modA of 2 for transfers and initial stepping due to poor standing balance, strong posterior lean, and difficulty with coordination of LE movement and foot placement for each step. The pt will continue to benefit from skilled PT to progress functional strength and stability, but will need continued rehab prior to d/c home as he currently lives alone and needs assist/supervision for all mobility.      Follow Up Recommendations SNF;Supervision/Assistance - 24 hour    Equipment Recommendations   (defer to post acute)    Recommendations for Other Services       Precautions / Restrictions Precautions Precautions: Fall Restrictions Weight Bearing Restrictions: No      Mobility  Bed Mobility Overal bed mobility: Needs Assistance Bed Mobility: Supine to Sit     Supine to sit: Min assist     General bed mobility comments: minA for BLE to EOB, assist for line management    Transfers Overall transfer level: Needs assistance Equipment used: 2 person hand held assist Transfers: Sit to/from  Stand;Stand Pivot Transfers Sit to Stand: Min assist;Mod assist;+2 physical assistance Stand pivot transfers: Mod assist;+2 physical assistance       General transfer comment: mina to power up initially, but modA to steady due to strong posterior lean. modA to maintain stability and manage lines with lateral steps to chair. pt able to verbalize posterior lean, no adjustments made without assist  Ambulation/Gait Ambulation/Gait assistance: Mod assist;+2 physical assistance Gait Distance (Feet): 5 Feet Assistive device: 2 person hand held assist Gait Pattern/deviations: Step-to pattern;Decreased stride length;Shuffle;Leaning posteriorly;Narrow base of support Gait velocity: decreased Gait velocity interpretation: <1.31 ft/sec, indicative of household ambulator General Gait Details: pt with posterior lean and small lateral steps. difficulty clearing LLE, but able to complete with increased time, assist, and facilitation for wt shift at times      Balance Overall balance assessment: Needs assistance Sitting-balance support: No upper extremity supported Sitting balance-Leahy Scale: Fair Sitting balance - Comments: supervision to sit EOB, pt prefers single UE support Postural control: Posterior lean Standing balance support: Bilateral upper extremity supported;During functional activity Standing balance-Leahy Scale: Poor Standing balance comment: reliant on BUE support, posterior lean                             Pertinent Vitals/Pain Pain Assessment: Faces Faces Pain Scale: Hurts whole lot Pain Location: neck (surgical site) Pain Descriptors / Indicators: Operative site guarding;Moaning;Sharp;Sore Pain Intervention(s): Limited activity within patient's tolerance;Monitored during session;Repositioned    Home Living Family/patient expects to be discharged to:: Private residence Living Arrangements: Alone  Available Help at Discharge: Friend(s);Available  PRN/intermittently Type of Home: House Home Access: Stairs to enter Entrance Stairs-Rails:  ("a post") Technical brewer of Steps: 1 Home Layout: One level Home Equipment: Cane - single point;Walker - 2 wheels      Prior Function Level of Independence: Independent         Comments: Pt reports independence with mobility, but multiple "almost" falls reccently. Intermittent use of cane or RW, but pt reports he was still driving, independent with all self care. Receiving rehab for vertigo on OP basis.      Hand Dominance        Extremity/Trunk Assessment   Upper Extremity Assessment Upper Extremity Assessment: Overall WFL for tasks assessed;LUE deficits/detail LUE Deficits / Details: WFL strength and no reports of change in sensation, mild dysmetria LUE Coordination: decreased fine motor    Lower Extremity Assessment Lower Extremity Assessment: Overall WFL for tasks assessed    Cervical / Trunk Assessment Cervical / Trunk Assessment: Other exceptions Cervical / Trunk Exceptions: s/p cervical surgery  Communication   Communication: Expressive difficulties (mumbled and soft spoken at times)  Cognition Arousal/Alertness: Awake/alert Behavior During Therapy: Restless;WFL for tasks assessed/performed Overall Cognitive Status: Impaired/Different from baseline Area of Impairment: Following commands;Safety/judgement;Awareness;Problem solving                       Following Commands: Follows multi-step commands inconsistently;Follows one step commands with increased time Safety/Judgement: Decreased awareness of safety;Decreased awareness of deficits Awareness: Intellectual Problem Solving: Slow processing;Decreased initiation;Requires verbal cues General Comments: Decreased insight to safety/deficits. Restless and eager to move but redirectable. significant internal distraction by pain, poor insight to need for continued assist, dangers of returning to live alone  with current deficits      General Comments General comments (skin integrity, edema, etc.): bandage on incision intact. BP stable (136/70, 127/71, 136/73) with all changes in position and mobility        Assessment/Plan    PT Assessment Patient needs continued PT services  PT Problem List Decreased activity tolerance;Decreased balance;Decreased mobility;Decreased coordination;Decreased knowledge of use of DME;Decreased safety awareness;Decreased knowledge of precautions       PT Treatment Interventions DME instruction;Gait training;Stair training;Functional mobility training;Therapeutic activities;Therapeutic exercise;Balance training;Neuromuscular re-education;Cognitive remediation;Patient/family education    PT Goals (Current goals can be found in the Care Plan section)  Acute Rehab PT Goals Patient Stated Goal: stop falling PT Goal Formulation: With patient Time For Goal Achievement: 06/22/20 Potential to Achieve Goals: Good    Frequency Min 3X/week   Barriers to discharge Decreased caregiver support pt from home alone       AM-PAC PT "6 Clicks" Mobility  Outcome Measure Help needed turning from your back to your side while in a flat bed without using bedrails?: A Little Help needed moving from lying on your back to sitting on the side of a flat bed without using bedrails?: A Little Help needed moving to and from a bed to a chair (including a wheelchair)?: A Lot Help needed standing up from a chair using your arms (e.g., wheelchair or bedside chair)?: A Little Help needed to walk in hospital room?: A Lot Help needed climbing 3-5 steps with a railing? : A Lot 6 Click Score: 15    End of Session Equipment Utilized During Treatment: Gait belt Activity Tolerance: Patient tolerated treatment well Patient left: in chair;with chair alarm set;with call bell/phone within reach Nurse Communication: Mobility status PT Visit Diagnosis: Unsteadiness on feet (R26.81);Difficulty in  walking, not elsewhere classified (R26.2)    Time: 1055-1140 PT Time Calculation (min) (ACUTE ONLY): 45 min   Charges:   PT Evaluation $PT Eval Moderate Complexity: 1 Mod PT Treatments $Gait Training: 8-22 mins $Therapeutic Activity: 8-22 mins        Karma Ganja, PT, DPT   Acute Rehabilitation Department Pager #: [phone]  Otho Bellows 06/08/2020, 12:27 PM

## 2020-06-08 NOTE — Progress Notes (Signed)
Patient ID: Jose Ramirez, male   DOB: Apr 25, 1951, 69 y.o.   MRN: [id number] Seems to be doing well.  He is moving all extremities.  His dressing is dry.  He is awake and alert and conversant.  Will mobilize today.  Remove Foley today.

## 2020-06-09 NOTE — Progress Notes (Signed)
Patient ID: Jose Ramirez, male   DOB: June 16, 1951, 69 y.o.   MRN: [id number] Change in exam.  He awakens easily from sleep.  His dressing is dry.  He moves all extremities.  He is conversant.  He complains of incisional pain.  Continue to mobilize.

## 2020-06-09 NOTE — Evaluation (Signed)
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 PMH includes: HTN and GERD.    Clinical Impression   This 69 yo male admitted with above presents to acute OT with PLOF of being Mod I to independent with basic ADLs. Currently his is setup/S- Max A for basic ADLs and associated mobility. He will continue to benefit from acute OT with follow up at SNF.    Follow Up Recommendations  SNF;Supervision/Assistance - 24 hour    Equipment Recommendations  Other (comment) (TBD next venue)       Precautions / Restrictions Precautions Precautions: Fall Precaution Comments: incision at back of neck/head Restrictions Weight Bearing Restrictions: No      Mobility Bed Mobility Overal bed mobility: Needs Assistance Bed Mobility: Supine to Sit     Supine to sit: Min assist     General bed mobility comments: Min A for trunk    Transfers Overall transfer level: Needs assistance Equipment used: 1 person hand held assist Transfers: Sit to/from [facility] Sit to Stand: Min assist Stand pivot transfers: Min assist       General transfer comment: small steps for stand pivot, no posterior lean today    Balance Overall balance assessment: Needs assistance Sitting-balance support: No upper extremity supported;Feet supported Sitting balance-Leahy Scale: Good     Standing balance support: Bilateral upper extremity supported Standing balance-Leahy Scale: Poor Standing balance comment: reliant on Bil UE support, NO posterior lean today                           ADL either performed or assessed with clinical judgement   ADL Overall ADL's : Needs assistance/impaired Eating/Feeding:  Independent;Sitting   Grooming: Set up;Supervision/safety;Sitting   Upper Body Bathing: Supervision/ safety;Set up;Sitting   Lower Body Bathing: Maximal assistance Lower Body Bathing Details (indicate cue type and reason): min A sit<>stand Upper Body Dressing : Moderate assistance;Sitting   Lower Body Dressing: Maximal assistance Lower Body Dressing Details (indicate cue type and reason): min A sit<>stand Toilet Transfer: Minimal assistance;Stand-pivot Toilet Transfer Details (indicate cue type and reason): bed>recliner going to right Toileting- Clothing Manipulation and Hygiene: Moderate assistance Toileting - Clothing Manipulation Details (indicate cue type and reason): min A sit<>stand             Vision Baseline Vision/History: No visual deficits Patient Visual Report: No change from baseline              Pertinent Vitals/Pain Pain Assessment: 0-10 Faces Pain Scale: Hurts whole lot Pain Location: neck (surgical site) Pain Descriptors / Indicators: Burning Pain Intervention(s): Limited activity within patient's tolerance;Patient requesting pain meds-RN notified;RN gave pain meds during session;Ice applied     Hand Dominance Right   Extremity/Trunk Assessment Upper Extremity Assessment Upper Extremity Assessment: Overall WFL for tasks assessed           Communication Communication Communication: No difficulties   Cognition Arousal/Alertness: Awake/alert Behavior During Therapy: WFL for tasks assessed/performed Overall Cognitive Status: Within Functional Limits for tasks assessed                                 General Comments: Fully aware he cannot  go home alone at this time              [place] expects to be discharged to:: [place]: Alone Available Help at Discharge: Friend(s);Available PRN/intermittently Type of Home: House Home Access: Stairs to enter [employer]  of Steps: 1   Home Layout: One level     Bathroom Shower/Tub: Teacher, early years/pre: Standard     Home Equipment: Cane - single point;Walker - 2 wheels          Prior Functioning/Environment Level of Independence: Independent        Comments: Pt reports independence with mobility, but multiple "almost" falls reccently. Intermittent use of cane or RW, but pt reports he was still driving, independent with all self care. Receiving rehab for vertigo on OP basis.         OT Problem List: Impaired balance (sitting and/or standing);Pain      OT Treatment/Interventions: Self-care/ADL training;DME and/or AE instruction;Patient/family education;Balance training    OT Goals(Current goals can be found in the care plan section) Acute Rehab OT Goals Patient Stated Goal: to go to rehab and then home OT Goal Formulation: With patient Time For Goal Achievement: 06/23/20 Potential to Achieve Goals: Good  OT Frequency: Min 2X/week   Barriers to D/C: Decreased caregiver support             AM-PAC OT "6 Clicks" Daily Activity     Outcome Measure Help from another person eating meals?: None Help from another person taking care of personal grooming?: A Little Help from another person toileting, which includes using toliet, bedpan, or urinal?: A Lot Help from another person bathing (including washing, rinsing, drying)?: A Lot Help from another person to put on and taking off regular upper body clothing?: A Lot Help from another person to put on and taking off regular lower body clothing?: A Lot 6 Click Score: 8   End of Session Equipment Utilized During Treatment: Gait belt;Oxygen (on 4 liters upon arrival, removed and pt dropped to 89% on RA with transfers, replaced on 1 liter per RN) Nurse Communication: Mobility status;Patient requests pain meds  Activity Tolerance: Patient tolerated treatment well Patient left: in chair;with call bell/phone within reach;with chair  alarm set  OT Visit Diagnosis: Unsteadiness on feet (R26.81);Other abnormalities of gait and mobility (R26.89);Pain Pain - part of body:  (incisional)                Time: 6387-5643 OT Time Calculation (min): 35 min Charges:  OT General Charges $OT Visit: 1 Visit OT Evaluation $OT Eval Moderate Complexity: 1 Mod OT Treatments $Self Care/Home Management : 8-22 mins  [place] Circle, OTR/L Acute [employer] Pager [phone] Office [phone]     Almon Register 06/09/2020, 9:26 AM

## 2020-06-09 NOTE — Progress Notes (Signed)
During my initial assessment, pt complained of his inability to void. Pt stated that he has not voided since morning. Bladder scan performed,and total of 1000 cc of urine scanned. Ln and out cath performed, and a total of 1400 cc of urine removed, Post scan showed 56 cc of residual.

## 2020-06-10 ENCOUNTER — Encounter (HOSPITAL_COMMUNITY): Payer: Self-pay | Admitting: Neurosurgery

## 2020-06-10 LAB — BASIC METABOLIC PANEL
Anion gap: 8 (ref 5–15)
BUN: 9 mg/dL (ref 8–23)
CO2: 29 mmol/L (ref 22–32)
Calcium: 8.3 mg/dL — ABNORMAL LOW (ref 8.9–10.3)
Chloride: 99 mmol/L (ref 98–111)
Creatinine, Ser: 0.63 mg/dL (ref 0.61–1.24)
GFR, Estimated: >60 mL/min (ref >60)
Glucose, Bld: 122 mg/dL — ABNORMAL HIGH (ref 70–99)
Potassium: 3.6 mmol/L (ref 3.5–5.1)
Sodium: 136 mmol/L (ref 135–145)

## 2020-06-10 MED ORDER — TAMSULOSIN HCL 0.4 MG PO CAPS
0.4000 mg | ORAL_CAPSULE | Freq: Every day | ORAL | Status: DC
  Administered 2020-06-10 – 2020-06-12 (×3): 0.4 mg via ORAL
  Filled 2020-06-10 (×3): qty 1

## 2020-06-10 NOTE — Progress Notes (Signed)
Patient unable to void. Assessment indicated distended bladder. Bladder scan performed, and 420 ml of urine scanned. In and out cath performed. A total of 500 ml of urine removed. Post scan showed 50 ml of residual.

## 2020-06-10 NOTE — Care Management Important Message (Signed)
Important Message  Patient Details  Name: Jose Ramirez MRN: [id number] Date of Birth: Oct 14, 1950   Medicare Important Message Given:  Yes     Orbie Pyo 06/10/2020, 3:37 PM

## 2020-06-10 NOTE — TOC Initial Note (Signed)
Transition of Care [facility]) - Initial/Assessment Note    Patient Details  Name: Jose Ramirez MRN: [id number] Date of Birth: January 20, 1951  Transition of Care [facility]) CM/SW Contact:    Vinie Sill, [place] Phone Number: 06/10/2020, 4:40 PM  Clinical Narrative:                  CSW visit with patient at bedside. CSW introduced self and explained role. CSW discussed with patient PT recommendation of short term rehab before discharging home. Patient states he lives alone and can not take care of himself. Patient states he is agreeable to SNF placement. CSW explained the SNF process. Patient's preferred SNF is [facility]. CSW explained the SNF process. No questions or concerns at this time.  CSW contacted [facility] - they will review and inform CSW. CSW will provide bed offers once available.  CSW will continue to follow and assist with discharge planning.   Thurmond Butts, MSW, [place] Clinical Social Worker    Expected Discharge Plan: Skilled Nursing Facility Barriers to Discharge: Continued Medical Work up, SNF Pending bed offer   Patient Goals and CMS Choice Patient states their goals for this hospitalization and ongoing recovery are:: "get back to whre I can take care of myself".      Expected Discharge Plan and Services Expected Discharge Plan: [place]       Living arrangements for the past 2 months: Single Family Home                                      Prior Living Arrangements/Services Living arrangements for the past 2 months: Single Family Home Lives with:: Self Patient language and need for interpreter reviewed:: No        Need for Family Participation in Patient Care: Yes (Comment) Care giver support system in place?: Yes (comment)   Criminal Activity/Legal Involvement Pertinent to Current Situation/Hospitalization: No - Comment as needed  Activities of Daily Living Home Assistive Devices/Equipment: None ADL Screening  (condition at time of admission) Patient's cognitive ability adequate to safely complete daily activities?: Yes Is the patient deaf or have difficulty hearing?: No Does the patient have difficulty seeing, even when wearing glasses/contacts?: No Does the patient have difficulty concentrating, remembering, or making decisions?: No Patient able to express need for assistance with ADLs?: Yes Does the patient have difficulty dressing or bathing?: No Independently performs ADLs?: Yes (appropriate for developmental age) Does the patient have difficulty walking or climbing stairs?: Yes Weakness of Legs: Both Weakness of Arms/Hands: None  Permission Sought/Granted Permission sought to share information with : Family Supports Permission granted to share information with : Yes, Verbal Permission Granted  Share Information with NAME: Xaviar Lunn  Permission granted to share info w AGENCY: SNFs  Permission granted to share info w Relationship: brother  Permission granted to share info w Contact Information: [phone]  Emotional Assessment Appearance:: Appears stated age Attitude/Demeanor/Rapport: Engaged Affect (typically observed): Accepting, Pleasant, Appropriate Orientation: : Oriented to Self, Oriented to Place, Oriented to  Time, Oriented to Situation Alcohol / Substance Use: Not Applicable Psych Involvement: No (comment)  Admission diagnosis:  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 Cerebellar tumor ([place]) 06/07/2020   PCP:  Cyndi Bender, PA-C Pharmacy:   [id number] - [place], [place]  [place] Phone: [phone] Fax: [phone]     Social Determinants of Health (SDOH) Interventions    Readmission Risk Interventions No flowsheet data found.

## 2020-06-10 NOTE — Progress Notes (Signed)
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 PMH includes: HTN and GERD.     PT Comments    The pt is continuing to make progress with mobility and PT goals at this time. He was able to progress to short bouts of walking in his room, but continues to rely on HHA of 2 with poor clearance and short, ataxic, steps bilaterally. The pt reports fatigue following 10-15 ft of ambulation at this time, but is making steady progress in activity tolerance so far. Of note, the pt demos decreased proprioception in his LLE (3/5 accurate vs 5/5 in RLE), and decreased LT sensation to his R foot with eyes closed despite reporting sensation intact and symmetrical with eyes open. Will continue to benefit from skilled PT acutely and following d/c to progress functional strength, endurance, coordination, and stability.    Follow Up Recommendations  SNF;Supervision/Assistance - 24 hour     Equipment Recommendations   (defer to post acute)    Recommendations for Other Services       Precautions / Restrictions Precautions Precautions: Fall Precaution Comments: incision at back of neck/head Restrictions Weight Bearing Restrictions: No    Mobility  Bed Mobility Overal bed mobility: Modified Independent             General bed mobility comments: no assist given, HOB elevated and increased time  Transfers Overall transfer level: Needs assistance Equipment used: 2 person hand held assist;1 person hand held assist Transfers: Sit to/from [facility] Sit to Stand: Min assist Stand pivot transfers: Mod assist;+2 safety/equipment       General transfer comment: minA of 2 initially with slight posterior lean, improved with  reps through session, but still requiring min/modA of 1 through HHA to maintain upright   Ambulation/Gait Ambulation/Gait assistance: Mod assist;+2 physical assistance Gait Distance (Feet): 10 Feet Assistive device: 2 person hand held assist Gait Pattern/deviations: Step-to pattern;Decreased stride length;Shuffle;Leaning posteriorly;Narrow base of support Gait velocity: decreased Gait velocity interpretation: <1.31 ft/sec, indicative of household ambulator General Gait Details: pt able to take short, shuffling steps forward with HHA of 2, then lateral stepping with continued modA of 2 to steady and line management.        Balance Overall balance assessment: Needs assistance Sitting-balance support: No upper extremity supported;Feet supported Sitting balance-Leahy Scale: Fair Sitting balance - Comments: supervision to sit EOB, pt prefers single UE support Postural control: Posterior lean Standing balance support: Bilateral upper extremity supported Standing balance-Leahy Scale: Poor Standing balance comment: reliant on BUE support, mild posterior lean today                            Cognition Arousal/Alertness: Awake/alert Behavior During Therapy: Restless;Impulsive;WFL for tasks assessed/performed Overall Cognitive Status: Impaired/Different from baseline Area of Impairment: Safety/judgement;Attention;Problem solving                   Current Attention Level: Sustained   Following Commands: Follows multi-step commands inconsistently;Follows one step commands with increased time Safety/Judgement: Decreased awareness of safety;Decreased awareness of deficits   Problem Solving: Slow processing;Decreased initiation;Requires verbal cues General Comments: Pt with decreased safety awareness in session, slight restelessness and impulsivity with movements, but redirectable.  General Comments General comments (skin integrity, edema, etc.): VSS on 2L O2  (spo2 95-100%); proprioception 3/5 on L foot, 5/5 R foot. LT sensation 0/4 on R foot, 2/4 on L foot, 3/3 bilateral legs      Pertinent Vitals/Pain Pain Assessment: Faces Faces Pain Scale: Hurts whole lot Pain Location: neck (surgical site) Pain Descriptors / Indicators: Burning;Discomfort;Grimacing;Moaning;Sharp Pain Intervention(s): Limited activity within patient's tolerance;Monitored during session;Repositioned           PT Goals (current goals can now be found in the care plan section) Acute Rehab PT Goals Patient Stated Goal: to go to rehab and then home PT Goal Formulation: With patient Time For Goal Achievement: 06/22/20 Potential to Achieve Goals: Good Progress towards PT goals: Progressing toward goals    Frequency    Min 3X/week      PT Plan Current plan remains appropriate       AM-PAC PT "6 Clicks" Mobility   Outcome Measure  Help needed turning from your back to your side while in a flat bed without using bedrails?: None Help needed moving from lying on your back to sitting on the side of a flat bed without using bedrails?: A Little Help needed moving to and from a bed to a chair (including a wheelchair)?: A Lot Help needed standing up from a chair using your arms (e.g., wheelchair or bedside chair)?: A Little Help needed to walk in hospital room?: A Lot Help needed climbing 3-5 steps with a railing? : A Lot 6 Click Score: 16    End of Session Equipment Utilized During Treatment: Gait belt Activity Tolerance: Patient tolerated treatment well;Patient limited by pain Patient left: in chair;with chair alarm set;with call bell/phone within reach Nurse Communication: Mobility status PT Visit Diagnosis: Unsteadiness on feet (R26.81);Difficulty in walking, not elsewhere classified (R26.2)     Time: 0962-8366 PT Time Calculation (min) (ACUTE ONLY): 25 min  Charges:  $Gait Training: 23-37 mins                     Karma Ganja, PT, DPT   Acute Rehabilitation  Department Pager #: [phone]   Otho Bellows 06/10/2020, 12:06 PM

## 2020-06-10 NOTE — Progress Notes (Signed)
Patient cont to retain urine. Bladder scan at 1040 revealed 381 mL of urine. Spoke with Dr. Annette Stable received new orders for flomax 0.4 mg PO if patient has not voided 2 hrs post medication will place a foley catheter.

## 2020-06-10 NOTE — Progress Notes (Signed)
  NEUROSURGERY PROGRESS NOTE   Urinary retention requiring I/O x3, flomax and ultimate foley placement this am Complains of mostly incisional pain. No new N/T/W Eager for diagnosis  EXAM:  BP 128/74 (BP Location: Left Arm)   Pulse 76   Temp 98.7 F (37.1 C) (Oral)   Resp 18   Ht 5\' 8"  (1.727 m)   Wt 88.5 kg   SpO2 93%   BMI 29.65 kg/m   Awake, alert, oriented  Speech fluent, appropriate  5/5 BUE/BLE  Incision: dried blood, no active drainage  IMPRESSION/PLAN 69 y.o. male POD 3 crani for cerebellar cyst resection. Doing well.  - urinary retention: bladder rest for now. flomax daily. Check BMP - Will need rehab. CSW consulted - continue to progress through post operative phase

## 2020-06-11 LAB — SARS CORONAVIRUS 2 BY RT PCR (HOSPITAL ORDER, PERFORMED IN ~~LOC~~ HOSPITAL LAB): SARS Coronavirus 2: NEGATIVE

## 2020-06-11 NOTE — TOC Progression Note (Signed)
Transition of Care [facility]) - Progression Note    Patient Details  Name: Jose Ramirez MRN: [id number] Date of Birth: Dec 20, 1950  Transition of Care [facility]) CM/SW [place], [place] Phone Number: 06/11/2020, 11:15 AM  Clinical Narrative:     Patient has no bed offers.  [facility] - has no availability CSW has requested [facility] to review-waiting on response.  Thurmond Butts, MSW, [place] Clinical Social Worker   Expected Discharge Plan: Skilled Nursing Facility Barriers to Discharge: Continued Medical Work up, SNF Pending bed offer  Expected Discharge Plan and Services Expected Discharge Plan: [place] arrangements for the past 2 months: Single Family Home                                       Social Determinants of Health (SDOH) Interventions    Readmission Risk Interventions No flowsheet data found.

## 2020-06-11 NOTE — Progress Notes (Addendum)
  NEUROSURGERY PROGRESS NOTE   No issues overnight.  No concerns this am Pain manangeable  EXAM:  BP (!) 144/79 (BP Location: Left Arm)   Pulse (!) 55   Temp 98.3 F (36.8 C) (Oral)   Resp 15   Ht 5\' 8"  (1.727 m)   Wt 88.5 kg   SpO2 97%   BMI 29.65 kg/m   Awake, alert, oriented  Speech fluent, appropriate  CN grossly intact  5/5 BUE/BLE  Incision: c/d/i  IMPRESSION/PLAN 69 y.o. male POD 3 crani for cerebellar cyst resection. Doing well.  - urinary retention: bladder rest for now. flomax daily. Try removing tomorrow - continue to progress through post operative phase - SNF planning    I have seen and examined Burton Apley and agree with the exam, impression, and plan as documented in the note by Ferne Reus, PA-C.  Doing well s/p suboccipital crani for cystic tumor, pathology is pending.  - Will need SNF placement.  - Postop MRI reviewed, likely small amount of residual tumor at the top of the vermis, region not able to be visualized intraoperatively with suboccipital approach. Will review pathology when available. 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 Would consider reoperation from occipital transtentorial approach if cyst with mass effect recurs.  I reviewed the above with the patient including MRI results and plan. All questions were answered.  Consuella Lose, MD [facility]

## 2020-06-11 NOTE — Progress Notes (Addendum)
At beginning of shift patient was sating at 95 on room air when awake. Once patient went to sleep started snoring and desating into the 80s. Put 2L of [place] on patient now sating at 95. Will continue to monitor.    2303-Due to patient mouth breathing, snoring and continuing to desat in the 80s. This nurse increased [place] from 2 to 4. Patient is now sating at 54. Will continue to monitor.

## 2020-06-11 NOTE — TOC Progression Note (Signed)
Transition of Care [facility]) - Progression Note    Patient Details  Name: Aul Mangieri MRN: [id number] Date of Birth: [birth year]  Transition of Care [facility]) CM/SW [place], [place] Phone Number: 06/11/2020, 3:40 PM  Clinical Narrative:     Patient has accepted bed offer with [facility] requested covid test.  Thurmond Butts, MSW, [employer] Clinical Social Worker   Expected Discharge Plan: [place] Barriers to Discharge: Continued Medical Work up, SNF Pending bed offer  Expected Discharge Plan and Services Expected Discharge Plan: [place] arrangements for the past 2 months: Single Family Home                                       Social Determinants of Health (SDOH) Interventions    Readmission Risk Interventions No flowsheet data found.

## 2020-06-11 NOTE — NC FL2 (Signed)
[place] LEVEL OF CARE SCREENING TOOL     IDENTIFICATION  Patient Name: Jose Ramirez Birthdate: 1950/11/23 Sex: male Admission Date (Current Location): 06/07/2020  [facility] and [place] Number:  Herbalist and Address:  [facility], [place] [address] [place], [place] [address]      Provider Number: [id number]  Attending Physician Name and Address:  Consuella Lose, MD  Relative Name and Phone Number:  Posey Pronto [phone]    Current Level of Care: Hospital Recommended Level of Care: [place] Prior Approval Number:    Date Approved/Denied:   PASRR Number: [id number]  Discharge Plan: SNF    Current Diagnoses: Patient Active Problem List   Diagnosis Date Noted  . 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    Orientation RESPIRATION BLADDER Height & Weight     Self, Time, Situation, Place  O2 (Nasal Cannula 4 liters) Continent (Urethral Catheter Non-latex Fr.) Weight: 195 lb (88.5 kg) Height:  5\' 8"  (172.7 cm)  BEHAVIORAL SYMPTOMS/MOOD NEUROLOGICAL BOWEL NUTRITION STATUS      Continent Diet (See discharge summary)  AMBULATORY STATUS COMMUNICATION OF NEEDS Skin   Limited Assist Verbally Surgical wounds (approp.for ethnicity,surgical incision closed,head,other,honeycomb,dry,intact,old drain,dressing in place)                       Personal Care Assistance Level of Assistance  Bathing, Feeding, Dressing Bathing Assistance: Limited assistance Feeding assistance: Independent (able to feed self;Clear liquid) Dressing Assistance: Limited assistance     Functional Limitations Info  Sight, Hearing, Speech Sight Info: Adequate Hearing Info: Adequate Speech Info: Adequate    SPECIAL CARE FACTORS FREQUENCY  PT (By licensed PT), OT (By licensed OT)     PT Frequency: 5x min weekly OT Frequency: 5x min weekly            Contractures Contractures Info: Not present    Additional Factors Info  Code  Status, Psychotropic Code Status Info: FULL   Psychotropic Info: traZODone (DESYREL) tablet 300 mg daily at bedtime         Current Medications (06/11/2020):  This is the current hospital active medication list Current Facility-Administered Medications  Medication Dose Route Frequency Provider Last Rate Last Admin  . 0.9 %  sodium chloride infusion   Intravenous Continuous Traci Sermon, PA-C 75 mL/hr at 06/11/20 1142 New Bag at 06/11/20 1142  . acetaminophen (TYLENOL) tablet 650 mg  650 mg Oral Q4H PRN Traci Sermon, PA-C   650 mg at 06/09/20 0022   Or  . acetaminophen (TYLENOL) suppository 650 mg  650 mg Rectal Q4H PRN Costella, Vista Mink, PA-C      . bisacodyl (DULCOLAX) EC tablet 5 mg  5 mg Oral Daily PRN Costella, Vincent J, PA-C   5 mg at 06/07/20 1401  . Chlorhexidine Gluconate Cloth 2 % PADS 6 each  6 each Topical Q0600 Consuella Lose, MD   6 each at 06/11/20 [phone]  . HYDROcodone-acetaminophen (NORCO/VICODIN) 5-325 MG per tablet 1 tablet  1 tablet Oral Q4H PRN Traci Sermon, PA-C   1 tablet at 06/11/20 1355  . HYDROmorphone (DILAUDID) injection 0.5-1 mg  0.5-1 mg Intravenous Q2H PRN Costella, Vista Mink, PA-C   1 mg at 06/09/20 2250  . labetalol (NORMODYNE) injection 10-40 mg  10-40 mg Intravenous Q10 min PRN Costella, Vista Mink, PA-C   20 mg at 06/07/20 2038  . levETIRAcetam (KEPPRA) IVPB 500 mg/100 mL premix  500 mg Intravenous Q12H Costella,  Vista Mink, PA-C 400 mL/hr at 06/11/20 1358 500 mg at 06/11/20 1358  . methocarbamol (ROBAXIN) 500 mg in dextrose 5 % 50 mL IVPB  500 mg Intravenous Q6H PRN Costella, Vista Mink, PA-C   Stopped at 06/09/20 1044  . metoprolol tartrate (LOPRESSOR) tablet 50 mg  50 mg Oral BID Traci Sermon, PA-C   50 mg at 06/11/20 9024  . multivitamin with minerals tablet 1 tablet  1 tablet Oral Daily Traci Sermon, PA-C   1 tablet at 06/11/20 0919  . naloxone [facility]) injection 0.08 mg  0.08 mg Intravenous PRN Costella, Vista Mink,  PA-C      . ondansetron (ZOFRAN) tablet 4 mg  4 mg Oral Q4H PRN Costella, Vincent J, PA-C       Or  . ondansetron (ZOFRAN) injection 4 mg  4 mg Intravenous Q4H PRN Costella, Vincent J, PA-C   4 mg at 06/09/20 1717  . pantoprazole (PROTONIX) injection 40 mg  40 mg Intravenous QHS Costella, Vista Mink, PA-C   40 mg at 06/10/20 2156  . phenol (CHLORASEPTIC) mouth spray 1 spray  1 spray Mouth/Throat PRN Consuella Lose, MD      . polyethylene glycol (MIRALAX / GLYCOLAX) packet 17 g  17 g Oral Daily PRN Costella, Vista Mink, PA-C      . promethazine (PHENERGAN) injection 12.5 mg  12.5 mg Intravenous Q6H PRN Meyran, Ocie Cornfield, NP   12.5 mg at 06/10/20 0140  . promethazine (PHENERGAN) tablet 12.5-25 mg  12.5-25 mg Oral Q4H PRN Costella, Vista Mink, PA-C      . senna (SENOKOT) tablet 8.6 mg  1 tablet Oral BID Costella, Vincent J, PA-C   8.6 mg at 06/11/20 0919  . simvastatin (ZOCOR) tablet 40 mg  40 mg Oral QHS Traci Sermon, PA-C   40 mg at 06/10/20 2157  . sodium phosphate (FLEET) 7-19 GM/118ML enema 1 enema  1 enema Rectal Once PRN Costella, Vista Mink, PA-C      . tamsulosin (FLOMAX) capsule 0.4 mg  0.4 mg Oral Daily Costella, Vincent J, PA-C   0.4 mg at 06/11/20 0919  . traZODone (DESYREL) tablet 300 mg  300 mg Oral QHS Costella, Vista Mink, PA-C   300 mg at 06/10/20 2156     Discharge Medications: Please see discharge summary for a list of discharge medications.  Relevant Imaging Results:  Relevant Lab Results:   Additional Information [id number]  Trula Ore, LCSWA

## 2020-06-12 DIAGNOSIS — G930 Cerebral cysts: Secondary | ICD-10-CM | POA: Diagnosis not present

## 2020-06-12 DIAGNOSIS — R0902 Hypoxemia: Secondary | ICD-10-CM | POA: Diagnosis not present

## 2020-06-12 DIAGNOSIS — R270 Ataxia, unspecified: Secondary | ICD-10-CM | POA: Diagnosis not present

## 2020-06-12 DIAGNOSIS — R279 Unspecified lack of coordination: Secondary | ICD-10-CM | POA: Diagnosis not present

## 2020-06-12 DIAGNOSIS — E785 Hyperlipidemia, unspecified: Secondary | ICD-10-CM | POA: Diagnosis not present

## 2020-06-12 DIAGNOSIS — R260 Ataxic gait: Secondary | ICD-10-CM | POA: Diagnosis not present

## 2020-06-12 DIAGNOSIS — K219 Gastro-esophageal reflux disease without esophagitis: Secondary | ICD-10-CM | POA: Diagnosis not present

## 2020-06-12 DIAGNOSIS — I10 Essential (primary) hypertension: Secondary | ICD-10-CM | POA: Diagnosis not present

## 2020-06-12 DIAGNOSIS — M6281 Muscle weakness (generalized): Secondary | ICD-10-CM | POA: Diagnosis not present

## 2020-06-12 DIAGNOSIS — M2550 Pain in unspecified joint: Secondary | ICD-10-CM | POA: Diagnosis not present

## 2020-06-12 DIAGNOSIS — Z48811 Encounter for surgical aftercare following surgery on the nervous system: Secondary | ICD-10-CM | POA: Diagnosis not present

## 2020-06-12 DIAGNOSIS — R54 Age-related physical debility: Secondary | ICD-10-CM | POA: Diagnosis not present

## 2020-06-12 DIAGNOSIS — Z7401 Bed confinement status: Secondary | ICD-10-CM | POA: Diagnosis not present

## 2020-06-12 MED ORDER — OXYCODONE-ACETAMINOPHEN 7.5-325 MG PO TABS
1.0000 | ORAL_TABLET | ORAL | 0 refills | Status: DC | PRN
Start: 2020-06-12 — End: 2020-09-06

## 2020-06-12 NOTE — TOC Transition Note (Signed)
Transition of Care [facility]) - CM/SW Discharge Note   Patient Details  Name: Kiyan Burmester MRN: [id number] Date of Birth: 07-Oct-1950  Transition of Care [facility]) CM/SW Contact:  Vinie Sill, [place] Phone Number: 06/12/2020, 2:44 PM   Clinical Narrative:     Patient will DC to: [facility] Date: 06/12/2020 Family Notified: Tommy,brother-left voice message  Transport NB:[facility]   Per MD patient is ready for discharge. RN, patient, and facility notified of DC. Discharge Summary sent to facility. RN given number for [phone]. Ambulance transport requested for patient.   Clinical Social Worker signing off.  Thurmond Butts, MSW, [place] Clinical Social Worker     Final next level of care: Skilled Nursing Facility Barriers to Discharge: Barriers Resolved   Patient Goals and CMS Choice Patient states their goals for this hospitalization and ongoing recovery are:: "get back to whre I can take care of myself".      Discharge Placement              Patient chooses bed at:  [facility]) Patient to be transferred to facility by: [place] Name of family member notified: Konrad Dolores, brother - left voice message Patient and family notified of of transfer: 06/12/20  Discharge Plan and Services                                     Social Determinants of Health (SDOH) Interventions     Readmission Risk Interventions No flowsheet data found.

## 2020-06-12 NOTE — Progress Notes (Signed)
PTAR here to transport patient. VSS. IV removed. All belongings, discharge paperwork, and prescription given to PTAR. All questions answered.

## 2020-06-12 NOTE — Progress Notes (Signed)
  NEUROSURGERY PROGRESS NOTE   O2 desat overnight. Patient asymptomatic. On [place]. Reduced earlier this am and patient normal sat. No concerns this am.  EXAM:  BP (!) 141/69 (BP Location: Left Arm)   Pulse 67   Temp 98.3 F (36.8 C) (Oral)   Resp 13   Ht 5\' 8"  (1.727 m)   Wt 88.5 kg   SpO2 95%   BMI 29.65 kg/m   Awake, alert, oriented  Speech fluent, appropriate  CN grossly intact  5/5 BUE/BLE  Incision: c/d/i  IMPRESSION/PLAN 69 y.o. male 69 y.o. male POD4 crani for cerebellar cyst resection. Doing well.  - urinary retention: voiding trial today - desat: likely underlying OSA. Asymptomatic. Remove [place] and reassess. - SNF planning. If able to maintain O2 sat on room air and asx, will be ready for d/c to SNF

## 2020-06-12 NOTE — Op Note (Signed)
NEUROSURGERY OPERATIVE NOTE   PREOP DIAGNOSIS:  1. Cerebellar tumor   POSTOP DIAGNOSIS: Same  PROCEDURE: 1. Suboccipital craniectomy for resection of cerebellar tumor 2. Use of intraoperative microscope for microdissection 3. Expansile duraplasty  SURGEON: Dr. Consuella Lose, MD  ASSISTANT: Dr. Charlie Pitter, MD  ANESTHESIA: General Endotracheal  EBL: 600cc  SPECIMENS: Cerebellar tumor for permanent pathology  DRAINS: None  COMPLICATIONS: None immediate  CONDITION: Hemodynamically stable to PACU  HISTORY: Jose Ramirez is a 69 y.o. male Initially presenting to the [facility] as a referral from his neurologist for slowly progressive gait instability and incoordination.  His workup included MRI of the brain which revealed a large cystic lesion within the left cerebellum with a enhancing mural nodule at the superior aspect of the cyst at the top of the vermis.  There was significant local mass effect.  Given the patient's presenting symptoms and radiologic findings, surgical resection was recommended.  The risks, benefits, and alternatives to surgery as well as the expected postoperative course and recovery were all reviewed in detail with the patient.  After all his questions were answered informed consent was obtained and witnessed.   PROCEDURE IN DETAIL: The patient was brought to the operating room. After induction of general anesthesia, the patient was positioned on the operative table in the prone position in the [place] head holder. All pressure points were meticulously padded.  Midline skin incision was then marked out and prepped and draped in the usual sterile fashion.   after time-out was conducted, the midline skin incision was infiltrated with local anesthetic with epinephrine.  Incision was then made sharply and carried down through the subcutaneous tissue until the nuchal fascia was identified.  Fascia was then incised in a Y-shaped fashion based  superiorly.  Suboccipital musculature was then divided in the avascular midline plane until the  Occipital bone was identified, as was the arch of C1.  Subperiosteal dissection was then carried out along the occipital bone and C1 in order to fully expose the occiput.    At this point, high-speed drill was used to create a  Suboccipital craniectomy down to the foramen magnum.  Kerrison punches were used to extend the craniectomy laterally and superiorly to within a few mm of the torcular and transverse sinuses.  Once hemostasis was achieved in the epidural plane, the dura was incised in a Y-shaped fashion.    At this point the microscope was draped sterilely and brought into the field and the remainder of the case was done under the microscope using microdissection technique.  Using a combination of bipolar electrocautery and suction, corticectomy was made along the left cerebellar hemisphere.  Dissection through the cerebellar white matter was then carried out until the  Cyst was encountered and decompressed.  It was noted to be filled with thin yellow tinged fluid.    Once the cyst was entered, I was unable to visualize a red nodule at the superior aspect of the cyst corresponding to the enhancing mural nodule seen on preoperative MRI.  Using a combination of micro dissectors and bipolar electrocautery, the inferior, posterior, and more deep margins of this nodule were dissected away from the surrounding cyst wall and cerebellar tissue.  In working on the more superior aspect of the nodule, I encountered relatively brisk bleeding.  I was able to control this with bipolar electrocautery, and in removal of the nodule, bleeding appeared to stop.  I was then able to inspect the deep, inferior, and posterior  margins and no further tumor was identified.  Unfortunately, because of the angle of approach and the patient's tentorial angle, I was not able to fully visualize the superior aspect of the resection cavity in  order to ensure removal of the enhancing nodule.  Nonetheless, with bleeding controlled and lack of visualization of the superior aspect of the vermis, I elected to began closure.    A piece of bovine pericardial dural graft was used in order to achieve an expansile duraplasty.  This was sewed in place in watertight fashion using a combination of running 4 0 Nurolon stitches.  Suture line was then covered with a layer of polyethylene glycol sealant.    The suboccipital musculature was then reapproximated with interrupted 0 Vicryl stitches.  The  Nuchal fascia was then closed in a watertight fashion with interrupted 0 Vicryl stitches.  Subcutaneous layer was closed with interrupted 0 Vicryl stitches and the skin was closed with interrupted 3 0 Vicryl subcuticular stitches.  A layer of Dermabond was placed.  Once the Dermabond had completely dried sterile dressing was applied.    At the end of the case all sponge, needle, instrument, and cottonoid counts were correct.  Patient was then removed from the [place] head holder, transferred to the stretcher, and extubated.  He was then taken to the postanesthesia care unit in stable hemodynamic condition.

## 2020-06-12 NOTE — Progress Notes (Signed)
Report called to [facility] RN all questions answered. Patient awaiting PTAR.

## 2020-06-12 NOTE — Discharge Summary (Signed)
  Physician Discharge Summary  Patient ID: Jose Ramirez MRN: [id number] DOB/AGE: 20-Apr-1951 69 y.o.  Admit date: 06/07/2020 Discharge date: 06/12/2020  Admission Diagnoses:  Cerebellar tumor  Discharge Diagnoses:  Same Active Problems:   Cerebellar tumor [facility])   Discharged Condition: Stable  Hospital Course:  Jose Ramirez is a 69 y.o. male who was admitted for the below procedure. Post op complicated by urinary retention requiring foley & flomax. At time of discharge, pain was well controlled, ambulating with Pt/OT, tolerating po, voiding normal. Ready for discharge.  Treatments: Surgery - posterior craniectomy for resection of cerebellar cyst/tumor  Discharge Exam: Blood pressure (!) 141/69, pulse 67, temperature 98.3 F (36.8 C), temperature source Oral, resp. rate 13, height 5\' 8"  (1.727 m), weight 88.5 kg, SpO2 95 %. Awake, alert, oriented Speech fluent, appropriate CN grossly intact 5/5 BUE/BLE Wound c/d/i  Disposition: Discharge disposition: 03-Skilled Nursing Facility       Discharge Instructions    Call MD for:  difficulty breathing, headache or visual disturbances   Complete by: As directed    Call MD for:  persistant dizziness or light-headedness   Complete by: As directed    Call MD for:  redness, tenderness, or signs of infection (pain, swelling, redness, odor or green/yellow discharge around incision site)   Complete by: As directed    Call MD for:  severe uncontrolled pain   Complete by: As directed    Call MD for:  temperature >100.4   Complete by: As directed    Diet - low sodium heart healthy   Complete by: As directed    Driving Restrictions   Complete by: As directed    Do not drive until given clearance.   Increase activity slowly   Complete by: As directed    Lifting restrictions   Complete by: As directed    Do not lift anything >10lbs. Avoid bending and twisting in awkward positions. Avoid bending at the back.   May shower / Bathe    Complete by: As directed    In 24 hours. Okay to wash wound with warm soapy water. Avoid scrubbing the wound. Pat dry.   No wound care   Complete by: As directed      Allergies as of 06/12/2020   No Known Allergies     Medication List    TAKE these medications   Fish Oil 1000 MG Caps Take 2,000 mg by mouth in the morning and at bedtime.   metoprolol tartrate 100 MG tablet Commonly known as: LOPRESSOR Take 50 mg by mouth 2 (two) times daily.   multivitamin with minerals Tabs tablet Take 1 tablet by mouth daily.   oxyCODONE-acetaminophen 7.5-325 MG tablet Commonly known as: Percocet Take 1 tablet by mouth every 4 (four) hours as needed for severe pain.   simvastatin 40 MG tablet Commonly known as: ZOCOR Take 40 mg by mouth at bedtime.   traZODone 150 MG tablet Commonly known as: DESYREL Take 300 mg by mouth at bedtime.       Follow-up Information    Consuella Lose, MD. Schedule an appointment as soon as possible for a visit in 3 week(s).   Specialty: Neurosurgery Contact information: [address] [place] [address] [phone]               Signed: Traci Sermon 06/12/2020, 8:11 AM

## 2020-06-12 NOTE — Progress Notes (Signed)
Decreased O2 from 4 to 2. Tolerating well and sating at 94.

## 2020-06-13 DIAGNOSIS — G930 Cerebral cysts: Secondary | ICD-10-CM | POA: Diagnosis not present

## 2020-06-13 DIAGNOSIS — I10 Essential (primary) hypertension: Secondary | ICD-10-CM | POA: Diagnosis not present

## 2020-06-13 DIAGNOSIS — Z48811 Encounter for surgical aftercare following surgery on the nervous system: Secondary | ICD-10-CM | POA: Diagnosis not present

## 2020-06-19 LAB — SURGICAL PATHOLOGY

## 2020-06-20 ENCOUNTER — Encounter (HOSPITAL_COMMUNITY): Payer: Self-pay

## 2020-06-25 ENCOUNTER — Ambulatory Visit: Payer: Medicare Other | Admitting: Neurology

## 2020-06-28 DIAGNOSIS — I10 Essential (primary) hypertension: Secondary | ICD-10-CM | POA: Diagnosis not present

## 2020-06-28 DIAGNOSIS — Z48811 Encounter for surgical aftercare following surgery on the nervous system: Secondary | ICD-10-CM | POA: Diagnosis not present

## 2020-06-28 DIAGNOSIS — G930 Cerebral cysts: Secondary | ICD-10-CM | POA: Diagnosis not present

## 2020-07-05 ENCOUNTER — Other Ambulatory Visit: Payer: Self-pay | Admitting: Neurosurgery

## 2020-07-05 DIAGNOSIS — D432 Neoplasm of uncertain behavior of brain, unspecified: Secondary | ICD-10-CM

## 2020-07-12 DIAGNOSIS — Z48811 Encounter for surgical aftercare following surgery on the nervous system: Secondary | ICD-10-CM | POA: Diagnosis not present

## 2020-07-12 DIAGNOSIS — G930 Cerebral cysts: Secondary | ICD-10-CM | POA: Diagnosis not present

## 2020-07-12 DIAGNOSIS — I10 Essential (primary) hypertension: Secondary | ICD-10-CM | POA: Diagnosis not present

## 2020-07-15 DIAGNOSIS — F419 Anxiety disorder, unspecified: Secondary | ICD-10-CM | POA: Diagnosis not present

## 2020-07-15 DIAGNOSIS — R269 Unspecified abnormalities of gait and mobility: Secondary | ICD-10-CM | POA: Diagnosis not present

## 2020-07-15 DIAGNOSIS — Z8673 Personal history of transient ischemic attack (TIA), and cerebral infarction without residual deficits: Secondary | ICD-10-CM | POA: Diagnosis not present

## 2020-07-15 DIAGNOSIS — G930 Cerebral cysts: Secondary | ICD-10-CM | POA: Diagnosis not present

## 2020-07-15 DIAGNOSIS — D481 Neoplasm of uncertain behavior of connective and other soft tissue: Secondary | ICD-10-CM | POA: Diagnosis not present

## 2020-07-15 DIAGNOSIS — K5900 Constipation, unspecified: Secondary | ICD-10-CM | POA: Diagnosis not present

## 2020-07-15 DIAGNOSIS — N390 Urinary tract infection, site not specified: Secondary | ICD-10-CM | POA: Diagnosis not present

## 2020-07-15 DIAGNOSIS — I10 Essential (primary) hypertension: Secondary | ICD-10-CM | POA: Diagnosis not present

## 2020-07-15 DIAGNOSIS — Z48811 Encounter for surgical aftercare following surgery on the nervous system: Secondary | ICD-10-CM | POA: Diagnosis not present

## 2020-07-15 DIAGNOSIS — Z6828 Body mass index (BMI) 28.0-28.9, adult: Secondary | ICD-10-CM | POA: Diagnosis not present

## 2020-07-16 DIAGNOSIS — Z48811 Encounter for surgical aftercare following surgery on the nervous system: Secondary | ICD-10-CM | POA: Diagnosis not present

## 2020-07-16 DIAGNOSIS — F419 Anxiety disorder, unspecified: Secondary | ICD-10-CM | POA: Diagnosis not present

## 2020-07-16 DIAGNOSIS — N390 Urinary tract infection, site not specified: Secondary | ICD-10-CM | POA: Diagnosis not present

## 2020-07-16 DIAGNOSIS — Z8673 Personal history of transient ischemic attack (TIA), and cerebral infarction without residual deficits: Secondary | ICD-10-CM | POA: Diagnosis not present

## 2020-07-16 DIAGNOSIS — G930 Cerebral cysts: Secondary | ICD-10-CM | POA: Diagnosis not present

## 2020-07-16 DIAGNOSIS — I10 Essential (primary) hypertension: Secondary | ICD-10-CM | POA: Diagnosis not present

## 2020-07-22 DIAGNOSIS — Z48811 Encounter for surgical aftercare following surgery on the nervous system: Secondary | ICD-10-CM | POA: Diagnosis not present

## 2020-07-22 DIAGNOSIS — G930 Cerebral cysts: Secondary | ICD-10-CM | POA: Diagnosis not present

## 2020-07-22 DIAGNOSIS — F419 Anxiety disorder, unspecified: Secondary | ICD-10-CM | POA: Diagnosis not present

## 2020-07-22 DIAGNOSIS — N390 Urinary tract infection, site not specified: Secondary | ICD-10-CM | POA: Diagnosis not present

## 2020-07-22 DIAGNOSIS — Z8673 Personal history of transient ischemic attack (TIA), and cerebral infarction without residual deficits: Secondary | ICD-10-CM | POA: Diagnosis not present

## 2020-07-22 DIAGNOSIS — I10 Essential (primary) hypertension: Secondary | ICD-10-CM | POA: Diagnosis not present

## 2020-07-25 DIAGNOSIS — E785 Hyperlipidemia, unspecified: Secondary | ICD-10-CM | POA: Diagnosis not present

## 2020-07-25 DIAGNOSIS — Z1331 Encounter for screening for depression: Secondary | ICD-10-CM | POA: Diagnosis not present

## 2020-07-25 DIAGNOSIS — Z0000 Encounter for general adult medical examination without abnormal findings: Secondary | ICD-10-CM | POA: Diagnosis not present

## 2020-07-25 DIAGNOSIS — Z9181 History of falling: Secondary | ICD-10-CM | POA: Diagnosis not present

## 2020-07-25 DIAGNOSIS — Z8673 Personal history of transient ischemic attack (TIA), and cerebral infarction without residual deficits: Secondary | ICD-10-CM | POA: Diagnosis not present

## 2020-07-25 DIAGNOSIS — N390 Urinary tract infection, site not specified: Secondary | ICD-10-CM | POA: Diagnosis not present

## 2020-07-25 DIAGNOSIS — F419 Anxiety disorder, unspecified: Secondary | ICD-10-CM | POA: Diagnosis not present

## 2020-07-25 DIAGNOSIS — G930 Cerebral cysts: Secondary | ICD-10-CM | POA: Diagnosis not present

## 2020-07-25 DIAGNOSIS — I10 Essential (primary) hypertension: Secondary | ICD-10-CM | POA: Diagnosis not present

## 2020-07-25 DIAGNOSIS — Z48811 Encounter for surgical aftercare following surgery on the nervous system: Secondary | ICD-10-CM | POA: Diagnosis not present

## 2020-07-29 DIAGNOSIS — Z48811 Encounter for surgical aftercare following surgery on the nervous system: Secondary | ICD-10-CM | POA: Diagnosis not present

## 2020-07-29 DIAGNOSIS — F419 Anxiety disorder, unspecified: Secondary | ICD-10-CM | POA: Diagnosis not present

## 2020-07-29 DIAGNOSIS — N390 Urinary tract infection, site not specified: Secondary | ICD-10-CM | POA: Diagnosis not present

## 2020-07-29 DIAGNOSIS — Z8673 Personal history of transient ischemic attack (TIA), and cerebral infarction without residual deficits: Secondary | ICD-10-CM | POA: Diagnosis not present

## 2020-07-29 DIAGNOSIS — G930 Cerebral cysts: Secondary | ICD-10-CM | POA: Diagnosis not present

## 2020-07-29 DIAGNOSIS — I10 Essential (primary) hypertension: Secondary | ICD-10-CM | POA: Diagnosis not present

## 2020-07-31 DIAGNOSIS — I10 Essential (primary) hypertension: Secondary | ICD-10-CM | POA: Diagnosis not present

## 2020-07-31 DIAGNOSIS — Z48811 Encounter for surgical aftercare following surgery on the nervous system: Secondary | ICD-10-CM | POA: Diagnosis not present

## 2020-07-31 DIAGNOSIS — F419 Anxiety disorder, unspecified: Secondary | ICD-10-CM | POA: Diagnosis not present

## 2020-07-31 DIAGNOSIS — G930 Cerebral cysts: Secondary | ICD-10-CM | POA: Diagnosis not present

## 2020-07-31 DIAGNOSIS — N390 Urinary tract infection, site not specified: Secondary | ICD-10-CM | POA: Diagnosis not present

## 2020-07-31 DIAGNOSIS — Z8673 Personal history of transient ischemic attack (TIA), and cerebral infarction without residual deficits: Secondary | ICD-10-CM | POA: Diagnosis not present

## 2020-08-01 DIAGNOSIS — N390 Urinary tract infection, site not specified: Secondary | ICD-10-CM | POA: Diagnosis not present

## 2020-08-01 DIAGNOSIS — F419 Anxiety disorder, unspecified: Secondary | ICD-10-CM | POA: Diagnosis not present

## 2020-08-01 DIAGNOSIS — Z8673 Personal history of transient ischemic attack (TIA), and cerebral infarction without residual deficits: Secondary | ICD-10-CM | POA: Diagnosis not present

## 2020-08-01 DIAGNOSIS — I10 Essential (primary) hypertension: Secondary | ICD-10-CM | POA: Diagnosis not present

## 2020-08-01 DIAGNOSIS — G930 Cerebral cysts: Secondary | ICD-10-CM | POA: Diagnosis not present

## 2020-08-01 DIAGNOSIS — Z48811 Encounter for surgical aftercare following surgery on the nervous system: Secondary | ICD-10-CM | POA: Diagnosis not present

## 2020-08-05 DIAGNOSIS — Z8673 Personal history of transient ischemic attack (TIA), and cerebral infarction without residual deficits: Secondary | ICD-10-CM | POA: Diagnosis not present

## 2020-08-05 DIAGNOSIS — Z48811 Encounter for surgical aftercare following surgery on the nervous system: Secondary | ICD-10-CM | POA: Diagnosis not present

## 2020-08-05 DIAGNOSIS — F419 Anxiety disorder, unspecified: Secondary | ICD-10-CM | POA: Diagnosis not present

## 2020-08-05 DIAGNOSIS — G930 Cerebral cysts: Secondary | ICD-10-CM | POA: Diagnosis not present

## 2020-08-05 DIAGNOSIS — N390 Urinary tract infection, site not specified: Secondary | ICD-10-CM | POA: Diagnosis not present

## 2020-08-05 DIAGNOSIS — I10 Essential (primary) hypertension: Secondary | ICD-10-CM | POA: Diagnosis not present

## 2020-08-07 DIAGNOSIS — G930 Cerebral cysts: Secondary | ICD-10-CM | POA: Diagnosis not present

## 2020-08-07 DIAGNOSIS — N390 Urinary tract infection, site not specified: Secondary | ICD-10-CM | POA: Diagnosis not present

## 2020-08-07 DIAGNOSIS — F419 Anxiety disorder, unspecified: Secondary | ICD-10-CM | POA: Diagnosis not present

## 2020-08-07 DIAGNOSIS — Z8673 Personal history of transient ischemic attack (TIA), and cerebral infarction without residual deficits: Secondary | ICD-10-CM | POA: Diagnosis not present

## 2020-08-07 DIAGNOSIS — Z48811 Encounter for surgical aftercare following surgery on the nervous system: Secondary | ICD-10-CM | POA: Diagnosis not present

## 2020-08-07 DIAGNOSIS — I10 Essential (primary) hypertension: Secondary | ICD-10-CM | POA: Diagnosis not present

## 2020-08-12 DIAGNOSIS — Z8673 Personal history of transient ischemic attack (TIA), and cerebral infarction without residual deficits: Secondary | ICD-10-CM | POA: Diagnosis not present

## 2020-08-12 DIAGNOSIS — Z48811 Encounter for surgical aftercare following surgery on the nervous system: Secondary | ICD-10-CM | POA: Diagnosis not present

## 2020-08-12 DIAGNOSIS — G930 Cerebral cysts: Secondary | ICD-10-CM | POA: Diagnosis not present

## 2020-08-12 DIAGNOSIS — I10 Essential (primary) hypertension: Secondary | ICD-10-CM | POA: Diagnosis not present

## 2020-08-12 DIAGNOSIS — F419 Anxiety disorder, unspecified: Secondary | ICD-10-CM | POA: Diagnosis not present

## 2020-08-12 DIAGNOSIS — N390 Urinary tract infection, site not specified: Secondary | ICD-10-CM | POA: Diagnosis not present

## 2020-08-13 DIAGNOSIS — Z48811 Encounter for surgical aftercare following surgery on the nervous system: Secondary | ICD-10-CM | POA: Diagnosis not present

## 2020-08-13 DIAGNOSIS — G930 Cerebral cysts: Secondary | ICD-10-CM | POA: Diagnosis not present

## 2020-08-13 DIAGNOSIS — F419 Anxiety disorder, unspecified: Secondary | ICD-10-CM | POA: Diagnosis not present

## 2020-08-13 DIAGNOSIS — Z8673 Personal history of transient ischemic attack (TIA), and cerebral infarction without residual deficits: Secondary | ICD-10-CM | POA: Diagnosis not present

## 2020-08-13 DIAGNOSIS — N390 Urinary tract infection, site not specified: Secondary | ICD-10-CM | POA: Diagnosis not present

## 2020-08-13 DIAGNOSIS — I10 Essential (primary) hypertension: Secondary | ICD-10-CM | POA: Diagnosis not present

## 2020-08-14 DIAGNOSIS — N390 Urinary tract infection, site not specified: Secondary | ICD-10-CM | POA: Diagnosis not present

## 2020-08-14 DIAGNOSIS — Z8673 Personal history of transient ischemic attack (TIA), and cerebral infarction without residual deficits: Secondary | ICD-10-CM | POA: Diagnosis not present

## 2020-08-14 DIAGNOSIS — I10 Essential (primary) hypertension: Secondary | ICD-10-CM | POA: Diagnosis not present

## 2020-08-14 DIAGNOSIS — F419 Anxiety disorder, unspecified: Secondary | ICD-10-CM | POA: Diagnosis not present

## 2020-08-14 DIAGNOSIS — G930 Cerebral cysts: Secondary | ICD-10-CM | POA: Diagnosis not present

## 2020-08-14 DIAGNOSIS — Z48811 Encounter for surgical aftercare following surgery on the nervous system: Secondary | ICD-10-CM | POA: Diagnosis not present

## 2020-08-15 DIAGNOSIS — A499 Bacterial infection, unspecified: Secondary | ICD-10-CM | POA: Diagnosis not present

## 2020-08-15 DIAGNOSIS — Z6827 Body mass index (BMI) 27.0-27.9, adult: Secondary | ICD-10-CM | POA: Diagnosis not present

## 2020-08-15 DIAGNOSIS — Z1635 Resistance to multiple antimicrobial drugs: Secondary | ICD-10-CM | POA: Diagnosis not present

## 2020-08-15 DIAGNOSIS — N390 Urinary tract infection, site not specified: Secondary | ICD-10-CM | POA: Diagnosis not present

## 2020-08-20 DIAGNOSIS — N390 Urinary tract infection, site not specified: Secondary | ICD-10-CM | POA: Diagnosis not present

## 2020-08-20 DIAGNOSIS — Z48811 Encounter for surgical aftercare following surgery on the nervous system: Secondary | ICD-10-CM | POA: Diagnosis not present

## 2020-08-20 DIAGNOSIS — G930 Cerebral cysts: Secondary | ICD-10-CM | POA: Diagnosis not present

## 2020-08-20 DIAGNOSIS — I10 Essential (primary) hypertension: Secondary | ICD-10-CM | POA: Diagnosis not present

## 2020-08-20 DIAGNOSIS — F419 Anxiety disorder, unspecified: Secondary | ICD-10-CM | POA: Diagnosis not present

## 2020-08-20 DIAGNOSIS — Z8673 Personal history of transient ischemic attack (TIA), and cerebral infarction without residual deficits: Secondary | ICD-10-CM | POA: Diagnosis not present

## 2020-08-21 DIAGNOSIS — Z20822 Contact with and (suspected) exposure to covid-19: Secondary | ICD-10-CM | POA: Diagnosis not present

## 2020-08-21 DIAGNOSIS — C719 Malignant neoplasm of brain, unspecified: Secondary | ICD-10-CM | POA: Diagnosis not present

## 2020-08-21 DIAGNOSIS — R262 Difficulty in walking, not elsewhere classified: Secondary | ICD-10-CM | POA: Diagnosis not present

## 2020-08-21 DIAGNOSIS — G911 Obstructive hydrocephalus: Secondary | ICD-10-CM | POA: Diagnosis not present

## 2020-08-21 DIAGNOSIS — G96198 Other disorders of meninges, not elsewhere classified: Secondary | ICD-10-CM | POA: Diagnosis not present

## 2020-08-21 DIAGNOSIS — I10 Essential (primary) hypertension: Secondary | ICD-10-CM | POA: Diagnosis not present

## 2020-08-21 DIAGNOSIS — Z8673 Personal history of transient ischemic attack (TIA), and cerebral infarction without residual deficits: Secondary | ICD-10-CM | POA: Diagnosis not present

## 2020-08-21 DIAGNOSIS — Z86018 Personal history of other benign neoplasm: Secondary | ICD-10-CM | POA: Diagnosis not present

## 2020-08-23 ENCOUNTER — Inpatient Hospital Stay (HOSPITAL_COMMUNITY): Payer: Medicare Other

## 2020-08-23 ENCOUNTER — Other Ambulatory Visit: Payer: Self-pay

## 2020-08-23 ENCOUNTER — Encounter (HOSPITAL_COMMUNITY): Payer: Self-pay | Admitting: Neurosurgery

## 2020-08-23 ENCOUNTER — Inpatient Hospital Stay (HOSPITAL_COMMUNITY)
Admission: EM | Admit: 2020-08-23 | Discharge: 2020-09-06 | DRG: 033 | Disposition: A | Discharge status: Skilled Nursing Facility | Payer: Medicare Other | Source: Other Acute Inpatient Hospital | Attending: Neurosurgery | Admitting: Neurosurgery

## 2020-08-23 DIAGNOSIS — Z79899 Other long term (current) drug therapy: Secondary | ICD-10-CM | POA: Diagnosis not present

## 2020-08-23 DIAGNOSIS — G930 Cerebral cysts: Secondary | ICD-10-CM | POA: Diagnosis present

## 2020-08-23 DIAGNOSIS — E785 Hyperlipidemia, unspecified: Secondary | ICD-10-CM | POA: Diagnosis present

## 2020-08-23 DIAGNOSIS — R1313 Dysphagia, pharyngeal phase: Secondary | ICD-10-CM | POA: Diagnosis not present

## 2020-08-23 DIAGNOSIS — G919 Hydrocephalus, unspecified: Secondary | ICD-10-CM

## 2020-08-23 DIAGNOSIS — G910 Communicating hydrocephalus: Principal | ICD-10-CM | POA: Diagnosis present

## 2020-08-23 DIAGNOSIS — K219 Gastro-esophageal reflux disease without esophagitis: Secondary | ICD-10-CM | POA: Diagnosis present

## 2020-08-23 DIAGNOSIS — R2681 Unsteadiness on feet: Secondary | ICD-10-CM | POA: Diagnosis not present

## 2020-08-23 DIAGNOSIS — G911 Obstructive hydrocephalus: Secondary | ICD-10-CM | POA: Diagnosis not present

## 2020-08-23 DIAGNOSIS — Z20822 Contact with and (suspected) exposure to covid-19: Secondary | ICD-10-CM | POA: Diagnosis present

## 2020-08-23 DIAGNOSIS — L8990 Pressure ulcer of unspecified site, unspecified stage: Secondary | ICD-10-CM | POA: Insufficient documentation

## 2020-08-23 DIAGNOSIS — R079 Chest pain, unspecified: Secondary | ICD-10-CM | POA: Diagnosis not present

## 2020-08-23 DIAGNOSIS — Z8673 Personal history of transient ischemic attack (TIA), and cerebral infarction without residual deficits: Secondary | ICD-10-CM

## 2020-08-23 DIAGNOSIS — D431 Neoplasm of uncertain behavior of brain, infratentorial: Secondary | ICD-10-CM | POA: Diagnosis not present

## 2020-08-23 DIAGNOSIS — I10 Essential (primary) hypertension: Secondary | ICD-10-CM | POA: Diagnosis present

## 2020-08-23 DIAGNOSIS — I69891 Dysphagia following other cerebrovascular disease: Secondary | ICD-10-CM | POA: Diagnosis not present

## 2020-08-23 DIAGNOSIS — M6281 Muscle weakness (generalized): Secondary | ICD-10-CM | POA: Diagnosis not present

## 2020-08-23 DIAGNOSIS — G96198 Other disorders of meninges, not elsewhere classified: Secondary | ICD-10-CM | POA: Diagnosis not present

## 2020-08-23 DIAGNOSIS — R41841 Cognitive communication deficit: Secondary | ICD-10-CM | POA: Diagnosis not present

## 2020-08-23 DIAGNOSIS — I69828 Other speech and language deficits following other cerebrovascular disease: Secondary | ICD-10-CM | POA: Diagnosis not present

## 2020-08-23 DIAGNOSIS — R112 Nausea with vomiting, unspecified: Secondary | ICD-10-CM | POA: Diagnosis not present

## 2020-08-23 LAB — CBC WITH DIFFERENTIAL/PLATELET
Abs Immature Granulocytes: 0.03 10*3/uL (ref 0.00–0.07)
Basophils Absolute: 0.1 10*3/uL (ref 0.0–0.1)
Basophils Relative: 0 %
Eosinophils Absolute: 0.1 10*3/uL (ref 0.0–0.5)
Eosinophils Relative: 1 %
HCT: 41.9 % (ref 39.0–52.0)
Hemoglobin: 15 g/dL (ref 13.0–17.0)
Immature Granulocytes: 0 %
Lymphocytes Relative: 19 %
Lymphs Abs: 2.3 10*3/uL (ref 0.7–4.0)
MCH: 31.9 pg (ref 26.0–34.0)
MCHC: 35.8 g/dL (ref 30.0–36.0)
MCV: 89.1 fL (ref 80.0–100.0)
Monocytes Absolute: 0.8 10*3/uL (ref 0.1–1.0)
Monocytes Relative: 7 %
Neutro Abs: 8.6 10*3/uL — ABNORMAL HIGH (ref 1.7–7.7)
Neutrophils Relative %: 73 %
Platelets: 313 10*3/uL (ref 150–400)
RBC: 4.7 MIL/uL (ref 4.22–5.81)
RDW: 13.1 % (ref 11.5–15.5)
WBC: 11.8 10*3/uL — ABNORMAL HIGH (ref 4.0–10.5)
nRBC: 0 % (ref 0.0–0.2)

## 2020-08-23 LAB — MRSA PCR SCREENING: MRSA by PCR: NEGATIVE

## 2020-08-23 IMAGING — DX DG CHEST 1V PORT
1 series · 1 of 1 positions shown · non-contrast
Comparison: None.

CLINICAL DATA: Chest pain of uncertain etiology.

EXAM:
PORTABLE CHEST 1 VIEW

[chest]
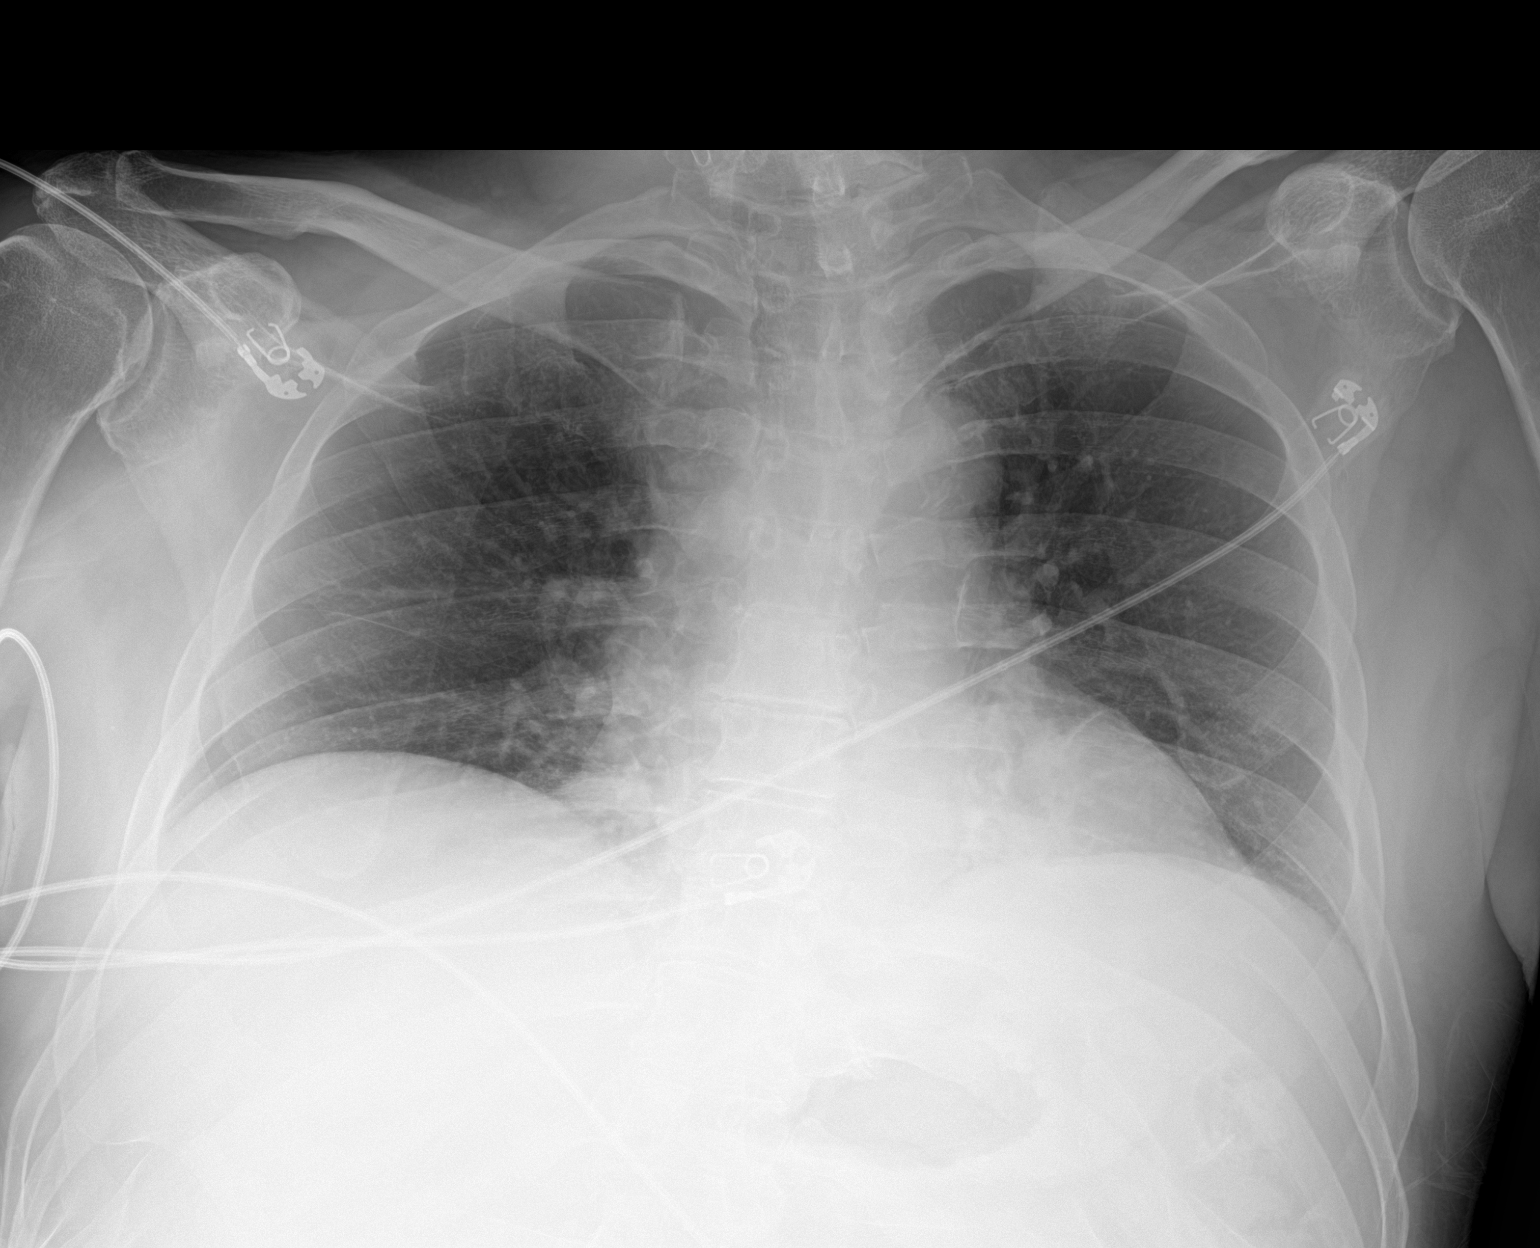

[1 of 1 positions shown; findings below may reference images not displayed]

FINDINGS: The heart size and mediastinal contours are within normal limits.
Both lungs are clear. The visualized skeletal structures are
unremarkable.
IMPRESSION: No active disease.

## 2020-08-23 MED ORDER — ONDANSETRON HCL 4 MG/2ML IJ SOLN
INTRAMUSCULAR | Status: AC
  Filled 2020-08-23: qty 2

## 2020-08-23 MED ORDER — ONDANSETRON HCL 4 MG PO TABS
4.0000 mg | ORAL_TABLET | Freq: Four times a day (QID) | ORAL | Status: DC | PRN
  Administered 2020-08-27: 4 mg via ORAL
  Filled 2020-08-23: qty 1

## 2020-08-23 MED ORDER — ADULT MULTIVITAMIN W/MINERALS CH
1.0000 | ORAL_TABLET | Freq: Every day | ORAL | Status: DC
  Administered 2020-08-24 – 2020-09-06 (×13): 1 via ORAL
  Filled 2020-08-23 (×13): qty 1

## 2020-08-23 MED ORDER — SODIUM CHLORIDE 0.9 % IV SOLN: INTRAVENOUS | Status: DC

## 2020-08-23 MED ORDER — SENNA 8.6 MG PO TABS
1.0000 | ORAL_TABLET | Freq: Two times a day (BID) | ORAL | Status: DC
  Administered 2020-08-24 – 2020-08-28 (×10): 8.6 mg via ORAL
  Filled 2020-08-23 (×11): qty 1

## 2020-08-23 MED ORDER — ONDANSETRON HCL 4 MG/2ML IJ SOLN
4.0000 mg | Freq: Four times a day (QID) | INTRAMUSCULAR | Status: DC | PRN
  Administered 2020-08-23 – 2020-08-28 (×7): 4 mg via INTRAVENOUS
  Filled 2020-08-23 (×7): qty 2

## 2020-08-23 MED ORDER — METOPROLOL TARTRATE 50 MG PO TABS
50.0000 mg | ORAL_TABLET | Freq: Two times a day (BID) | ORAL | Status: DC
  Administered 2020-08-23 – 2020-09-06 (×29): 50 mg via ORAL
  Filled 2020-08-23 (×29): qty 1

## 2020-08-23 MED ORDER — MECLIZINE HCL 25 MG PO TABS
25.0000 mg | ORAL_TABLET | Freq: Two times a day (BID) | ORAL | Status: DC | PRN
  Administered 2020-08-23 – 2020-08-28 (×4): 25 mg via ORAL
  Filled 2020-08-23 (×5): qty 1

## 2020-08-23 MED ORDER — HYDROCODONE-ACETAMINOPHEN 5-325 MG PO TABS
1.0000 | ORAL_TABLET | ORAL | Status: DC | PRN
  Administered 2020-08-29: 2 via ORAL
  Filled 2020-08-23: qty 2

## 2020-08-23 MED ORDER — HEPARIN SODIUM (PORCINE) 5000 UNIT/ML IJ SOLN
5000.0000 [IU] | Freq: Three times a day (TID) | INTRAMUSCULAR | Status: DC
  Administered 2020-08-23 – 2020-08-28 (×15): 5000 [IU] via SUBCUTANEOUS
  Filled 2020-08-23 (×17): qty 1

## 2020-08-23 MED ORDER — TRAZODONE HCL 150 MG PO TABS
150.0000 mg | ORAL_TABLET | Freq: Every day | ORAL | Status: DC
  Administered 2020-08-23 – 2020-09-06 (×15): 150 mg via ORAL
  Filled 2020-08-23 (×2): qty 3
  Filled 2020-08-23: qty 1
  Filled 2020-08-23 (×2): qty 3
  Filled 2020-08-23 (×5): qty 1
  Filled 2020-08-23: qty 3
  Filled 2020-08-23: qty 1
  Filled 2020-08-23: qty 3
  Filled 2020-08-23: qty 1
  Filled 2020-08-23: qty 3

## 2020-08-23 MED ORDER — ACETAMINOPHEN 650 MG RE SUPP: 650.0000 mg | Freq: Four times a day (QID) | RECTAL | Status: DC | PRN

## 2020-08-23 MED ORDER — ACETAMINOPHEN 325 MG PO TABS: 650.0000 mg | ORAL_TABLET | Freq: Four times a day (QID) | ORAL | Status: DC | PRN

## 2020-08-23 MED ORDER — SIMVASTATIN 20 MG PO TABS
40.0000 mg | ORAL_TABLET | Freq: Every day | ORAL | Status: DC
  Administered 2020-08-23 – 2020-09-06 (×15): 40 mg via ORAL
  Filled 2020-08-23 (×15): qty 2

## 2020-08-23 MED ORDER — NITROFURANTOIN MONOHYD MACRO 100 MG PO CAPS
100.0000 mg | ORAL_CAPSULE | Freq: Two times a day (BID) | ORAL | Status: DC
  Administered 2020-08-23 – 2020-08-24 (×2): 100 mg via ORAL
  Filled 2020-08-23 (×3): qty 1

## 2020-08-23 NOTE — H&P (Signed)
Jose Ramirez is an 70 y.o. male.   Chief Complaint: nausea, vomiting HPI: Jose Ramirez is a 70 y.o. male Whom underwent a suboccipital craniectomy in October. After a stay in rehabilitation in November he notes nausea and fairly consistent emesis.He presented to [facility] hospital on 08/21/2020 but we were unable to accept patient as a transfer at that time due to hospital capacity. Transferred today in no distress.   Past Medical History:  Diagnosis Date  . GERD (gastroesophageal reflux disease)   . Hypertension     Past Surgical History:  Procedure Laterality Date  . CRANIOTOMY N/A 06/07/2020   Procedure: SUBOCCIPITAL CRANIECTOMY FOR RESECTION OF CEREBELLAR TUMOR;  Surgeon: Consuella Lose, MD;  Location: [place];  Service: Neurosurgery;  Laterality: N/A;  SUBOCCIPITAL CRANIECTOMY FOR RESECTION OF CEREBELLAR TUMOR  . NO PAST SURGERIES      No family history on file. Social History:  reports that he has never smoked. He has never used smokeless tobacco. He reports previous alcohol use. He reports previous drug use.  Allergies: No Known Allergies  Medications Prior to Admission  Medication Sig Dispense Refill  . meclizine (ANTIVERT) 25 MG tablet Take 25 mg by mouth 2 (two) times daily as needed for dizziness or nausea.    . metoprolol tartrate (LOPRESSOR) 50 MG tablet Take 50 mg by mouth 2 (two) times daily.     . Multiple Vitamin (MULTIVITAMIN WITH MINERALS) TABS tablet Take 1 tablet by mouth daily.    . nitrofurantoin, macrocrystal-monohydrate, (MACROBID) 100 MG capsule Take 100 mg by mouth 2 (two) times daily.    . Omega-3 Fatty Acids (FISH OIL) 1000 MG CAPS Take 2,000 mg by mouth in the morning and at bedtime.    . simvastatin (ZOCOR) 40 MG tablet Take 40 mg by mouth at bedtime.    . traZODone (DESYREL) 150 MG tablet Take 150-300 mg by mouth at bedtime.    Marland Kitchen oxyCODONE-acetaminophen (PERCOCET) 7.5-325 MG tablet Take 1 tablet by mouth every 4 (four) hours as needed for severe pain.  (Patient not taking: No sig reported) 30 tablet 0    No results found for this or any previous visit (from the past 48 hour(s)). No results found.  Review of Systems  Constitutional: Positive for activity change and appetite change.  HENT: Negative.   Eyes: Negative.   Respiratory: Negative.   Cardiovascular: Negative.   Gastrointestinal: Positive for nausea and vomiting.  Endocrine: Negative.   Genitourinary: Negative.   Musculoskeletal:       Chest pain on palpation  Hematological: Negative.   Psychiatric/Behavioral: Negative.     Blood pressure (!) 144/87, pulse 78, temperature 98 F (36.7 C), temperature source Oral, resp. rate 16, SpO2 98 %. Physical Exam Constitutional:      General: He is not in acute distress.    Appearance: Normal appearance. He is not ill-appearing.  HENT:     Head:     Comments: Fluid collection sub occipital region    Mouth/Throat:     Mouth: Mucous membranes are moist.     Pharynx: Oropharynx is clear.  Eyes:     Extraocular Movements: Extraocular movements intact.     Conjunctiva/sclera: Conjunctivae normal.  Cardiovascular:     Rate and Rhythm: Normal rate and regular rhythm.  Pulmonary:     Effort: Pulmonary effort is normal.     Breath sounds: Normal breath sounds.  Abdominal:     General: Abdomen is flat.     Palpations: Abdomen is soft.  Musculoskeletal:  General: Normal range of motion.     Cervical back: Normal range of motion.  Skin:    General: Skin is warm and dry.  Neurological:     General: No focal deficit present.     Mental Status: He is alert and oriented to person, place, and time. Mental status is at baseline.     Cranial Nerves: Cranial nerves are intact.     Sensory: Sensation is intact.     Motor: Motor function is intact.     Coordination: Coordination is intact.     Deep Tendon Reflexes: Babinski sign absent on the right side. Babinski sign absent on the left side.     Comments: No drift Perrl, full  eom No diplopia   Psychiatric:        Mood and Affect: Mood normal.        Behavior: Behavior normal.        Thought Content: Thought content normal.        Judgment: Judgment normal.      Assessment/Plan Jose Ramirez is a 70 y.o. 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 Will observe in the unit at this time, may have to place a ventricular catheter. Not sure which fluid cavities communicate with each other, ventricular catheter may allow for characterization of the csf pathways. 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   Ashok Pall, MD 08/23/2020, 6:10 PM

## 2020-08-23 NOTE — Progress Notes (Addendum)
Pt belongings upon admission to 4NICU as follows:  1 black leather bag with several articles of clothing including shirts, pants, socks and underwear 3 toothbrushes 1 pair [place] tennis shoes Development worker, community with several cards and $175 in cash, specifically: 1 100 dollar bill 3 20 dollar bills 1 ten dollar bill 5 one dollar bills  And Rollator walker   The patient requested to keep all of these belongings at the beside despite encouragement from this RN to take the money to be locked in the security office.

## 2020-08-24 ENCOUNTER — Other Ambulatory Visit: Payer: Medicare Other

## 2020-08-24 LAB — COMPREHENSIVE METABOLIC PANEL
ALT: 50 U/L — ABNORMAL HIGH (ref 0–44)
AST: 34 U/L (ref 15–41)
Albumin: 3 g/dL — ABNORMAL LOW (ref 3.5–5.0)
Alkaline Phosphatase: 107 U/L (ref 38–126)
Anion gap: 10 (ref 5–15)
BUN: 6 mg/dL — ABNORMAL LOW (ref 8–23)
CO2: 29 mmol/L (ref 22–32)
Calcium: 8.9 mg/dL (ref 8.9–10.3)
Chloride: 96 mmol/L — ABNORMAL LOW (ref 98–111)
Creatinine, Ser: 0.59 mg/dL — ABNORMAL LOW (ref 0.61–1.24)
GFR, Estimated: >60 mL/min (ref >60)
Glucose, Bld: 169 mg/dL — ABNORMAL HIGH (ref 70–99)
Potassium: 3.9 mmol/L (ref 3.5–5.1)
Sodium: 135 mmol/L (ref 135–145)
Total Bilirubin: 0.7 mg/dL (ref 0.3–1.2)
Total Protein: 6.2 g/dL — ABNORMAL LOW (ref 6.5–8.1)

## 2020-08-24 LAB — HIV ANTIBODY (ROUTINE TESTING W REFLEX): HIV Screen 4th Generation wRfx: NONREACTIVE

## 2020-08-24 MED ORDER — CHLORHEXIDINE GLUCONATE CLOTH 2 % EX PADS
6.0000 | MEDICATED_PAD | Freq: Every day | CUTANEOUS | Status: DC
  Administered 2020-08-25 – 2020-09-06 (×7): 6 via TOPICAL

## 2020-08-24 MED ORDER — ENSURE ENLIVE PO LIQD
237.0000 mL | Freq: Two times a day (BID) | ORAL | Status: DC
  Administered 2020-08-24 – 2020-09-06 (×17): 237 mL via ORAL
  Filled 2020-08-24 (×2): qty 237

## 2020-08-24 NOTE — Progress Notes (Signed)
Initial Nutrition Assessment  INTERVENTION:   -Ensure Enlive po BID, each supplement provides 350 kcal and 20 grams of protein  NUTRITION DIAGNOSIS:   Inadequate oral intake related to nausea,vomiting as evidenced by per patient/family report.  GOAL:   Patient will meet greater than or equal to 90% of their needs  MONITOR:   PO intake,Supplement acceptance,Labs,Weight trends,I & O's  REASON FOR ASSESSMENT:   Malnutrition Screening Tool    ASSESSMENT:   70 y.o. 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  Unable to speak with pt at this time. Will need to gather diet history at follow-up.  Per chart review, pt reported to staff that he developed nausea and vomiting upon completion of rehab in November 2021 after having craniotomy in October 2021.  Per documentation, pt had more episodes of N/V last night, received Zofran.  Will order Ensure supplements given recent weight loss and N/V.  Per weight records, pt has lost 31 lbs since 10/22 (15% wt loss x 2.5 months, significant for time frame). Suspect some degree of malnutrition, unable to diagnose at this time.  Labs reviewed. Medications: senokot  NUTRITION - FOCUSED PHYSICAL EXAM:  Unable to complete, will attempt at follow-up  Diet Order:   Diet Order            Diet regular Room service appropriate? Yes with Assist; Fluid consistency: Thin  Diet effective now                 EDUCATION NEEDS:   No education needs have been identified at this time  Skin:  Skin Assessment: Reviewed RN Assessment  Last BM:  PTA  Height:   Ht Readings from Last 1 Encounters:  08/23/20 5\' 8"  (1.727 m)    Weight:   Wt Readings from Last 1 Encounters:  08/23/20 74.1 kg   BMI:  Body mass index is 24.84 kg/m.  Estimated Nutritional Needs:   Kcal:  1850-2050  Protein:  85-100g  Fluid:  2L/day  Clayton Bibles, MS, RD, LDN Inpatient Clinical Dietitian Contact  information available via Amion

## 2020-08-24 NOTE — Progress Notes (Signed)
Subjective: The patient is alert and pleasant. He denies nausea and vomiting. His headache is "the same".  Objective: Vital signs in last 24 hours: Temp:  [97.6 F (36.4 C)-99.2 F (37.3 C)] 98 F (36.7 C) (01/08 0800) Pulse Rate:  [64-104] 72 (01/08 0800) Resp:  [12-21] 13 (01/08 0800) BP: (103-165)/(72-111) 108/85 (01/08 0800) SpO2:  [90 %-98 %] 94 % (01/08 0800) Weight:  [74.1 kg] 74.1 kg (01/07 1829) Estimated body mass index is 24.84 kg/m as calculated from the following:   Height as of this encounter: 5\' 8"  (1.727 m).   Weight as of this encounter: 74.1 kg.   Intake/Output from previous day: 01/07 0701 - 01/08 0700 In: 480.9 [I.V.:480.9] Out: 200 [Urine:200] Intake/Output this shift: Total I/O In: 450 [I.V.:450] Out: -   Physical exam the patient is alert and oriented x3. He is moving all 4 extremities well.  Lab Results: Recent Labs    08/23/20 2250  WBC 11.8*  HGB 15.0  HCT 41.9  PLT 313   BMET Recent Labs    08/23/20 2250  NA 135  K 3.9  CL 96*  CO2 29  GLUCOSE 169*  BUN 6*  CREATININE 0.59*  CALCIUM 8.9    Studies/Results: DG Chest Port 1 View  Result Date: 08/23/2020 CLINICAL DATA:  Chest pain of uncertain etiology. EXAM: PORTABLE CHEST 1 VIEW COMPARISON:  None. FINDINGS: The heart size and mediastinal contours are within normal limits. Both lungs are clear. The visualized skeletal structures are unremarkable. IMPRESSION: No active disease. 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 Will likely need further evaluation with a brain MRI.  LOS: 1 day     Ophelia Charter 08/24/2020, 8:45 AM

## 2020-08-24 NOTE — Plan of Care (Signed)
  Problem: Nutrition: Goal: Adequate nutrition will be maintained Outcome: Progressing   

## 2020-08-25 ENCOUNTER — Inpatient Hospital Stay (HOSPITAL_COMMUNITY): Payer: Medicare Other

## 2020-08-25 IMAGING — MR MR HEAD WO/W CM
13 of 16 series · 33 of 48 positions shown · IV contrast (gadavist)
Comparison: [DATE]

CLINICAL DATA: Cerebellar hemangioblastoma post resection
[DATE], nausea and vomiting

EXAM:
MRI HEAD WITHOUT AND WITH CONTRAST
TECHNIQUE: Multiplanar, multiecho pulse sequences of the brain and surrounding
structures were obtained without and with intravenous contrast.
CONTRAST:  7.5mL GADAVIST GADOBUTROL 1 MMOL/ML IV SOLN

[Series 2: DWI · axial · 3.0mm · 0.94mm/px · z∈[-81,+82]mm · 5 of 112 slices shown (1 of 4)]
[im 1/112]
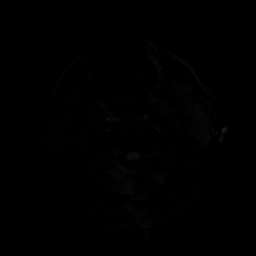
[im 28/112]
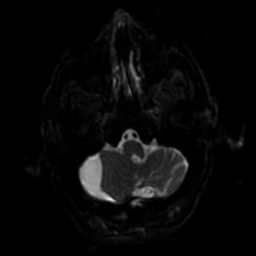
[im 56/112]
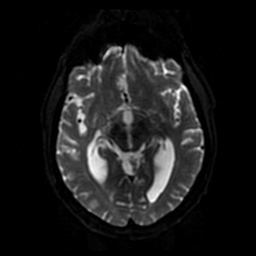
[im 84/112]
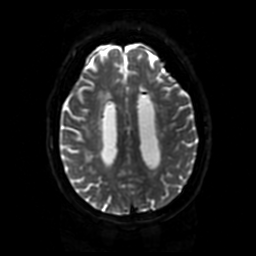
[im 112/112]
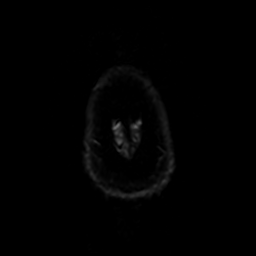

[Series 3: DWI · coronal · 4.0mm · 0.94mm/px · 3 of 74 slices shown (2 of 4)]
[im 1/74]
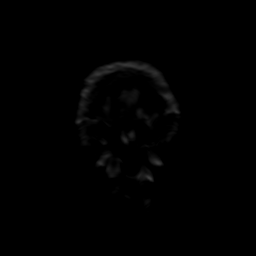
[im 37/74]
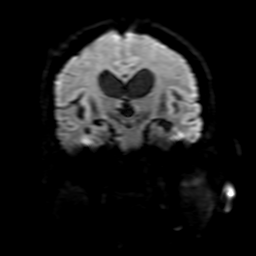
[im 74/74]
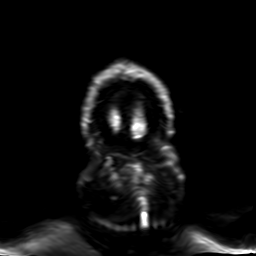

[Series 4: FLAIR · sagittal · 5.0mm · 0.47mm/px · 1 of 26 slices shown (1 of 2)]
[im 1/26]
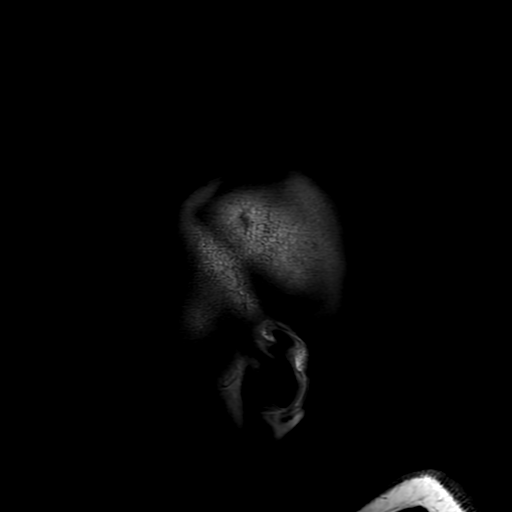

[Series 5: T2 · axial · 5.0mm · 0.23mm/px · 1 of 28 slices shown]
[im 1/28]
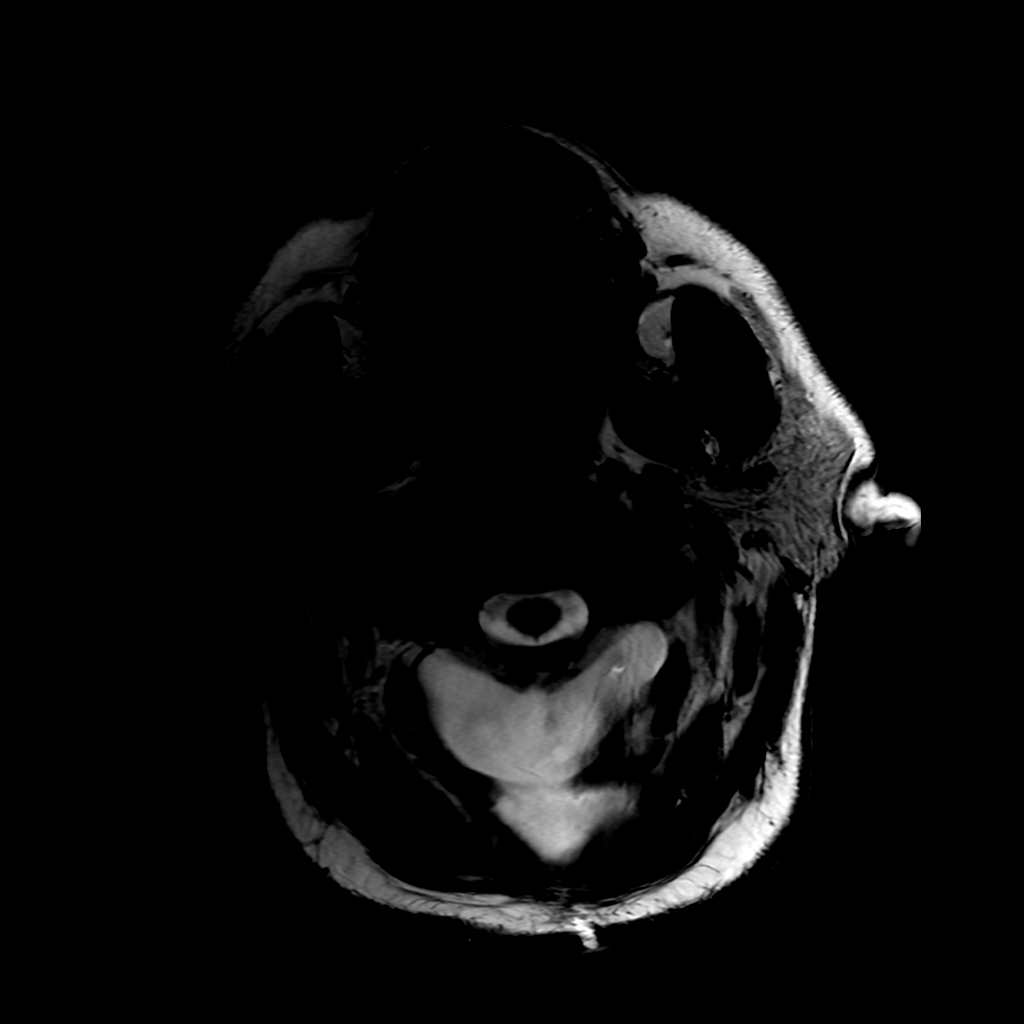

[Series 6: FLAIR · axial · 3.0mm · 0.45mm/px · 1 of 28 slices shown (2 of 2)]
[im 1/28]
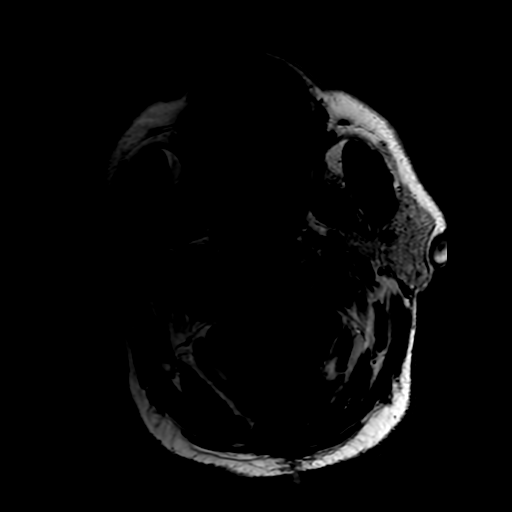

[Series 9: T2 post-contrast · coronal · 5.0mm · 0.39mm/px · 2 of 31 slices shown]
[im 1/31]
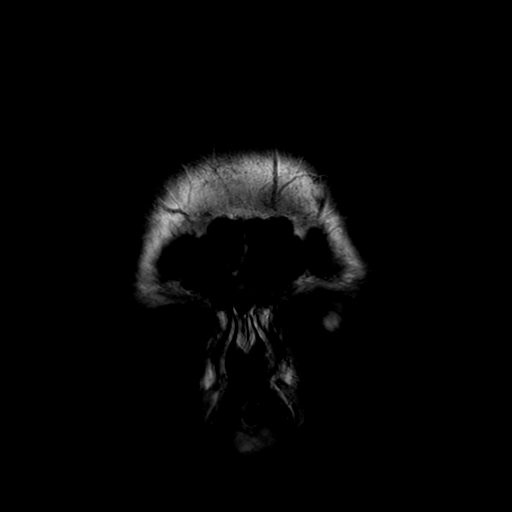
[im 31/31]
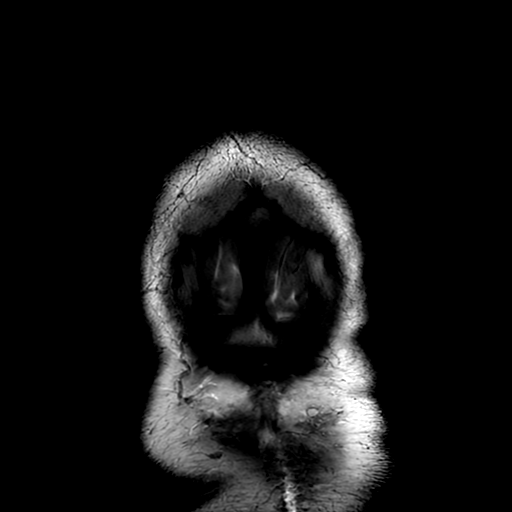

[Series 10: T1 · axial · 3.0mm · 0.94mm/px · z∈[-81,+82]mm · 3 of 56 slices shown (1 of 2)]
[im 1/56]
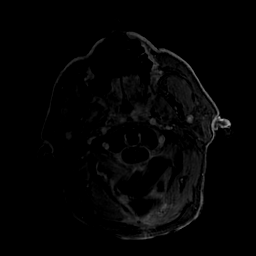
[im 28/56]
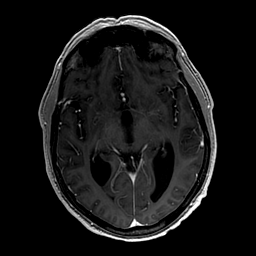
[im 56/56]
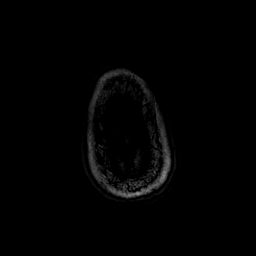

[Series 11: T1 · coronal · 5.0mm · 0.43mm/px · 1 of 31 slices shown (2 of 2)]
[im 1/31]
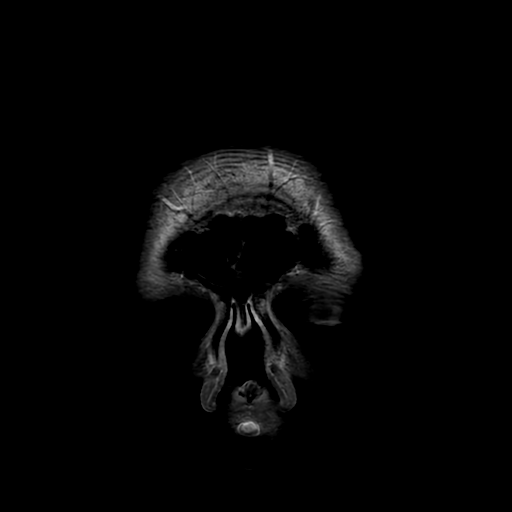

[Series 12: FLAIR post-contrast · sagittal · 5.0mm · 0.47mm/px · 1 of 26 slices shown]
[im 1/26]
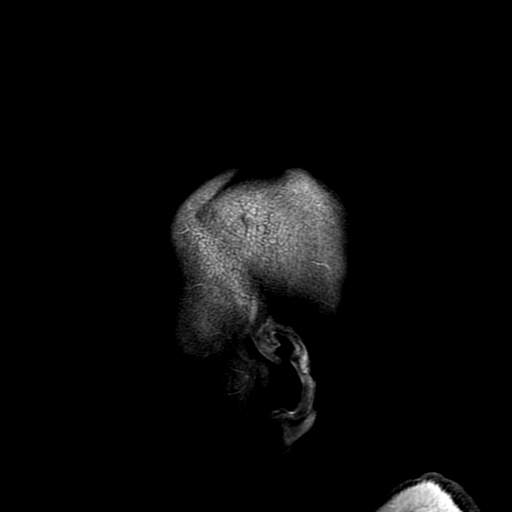

[Series 210: DWI · axial · 3.0mm · 0.94mm/px · z∈[-81,+82]mm · 6 of 112 slices shown (3 of 4)]
[im 1/112]
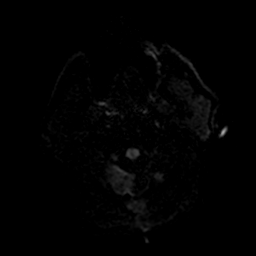
[im 23/112]
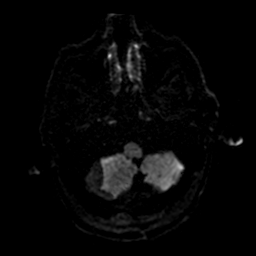
[im 45/112]
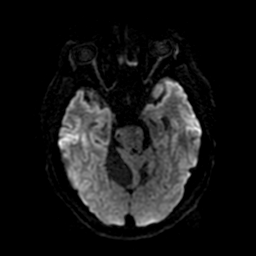
[im 67/112]
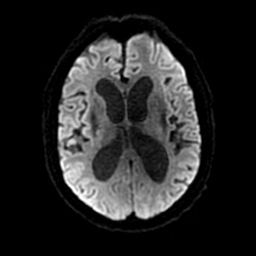
[im 89/112]
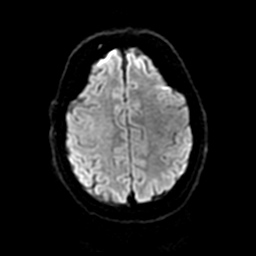
[im 112/112]
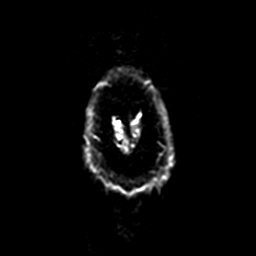

[Series 250: ADC · axial · 3.0mm · 0.94mm/px · z∈[-81,+82]mm · 3 of 56 slices shown (1 of 2)]
[im 1/56]
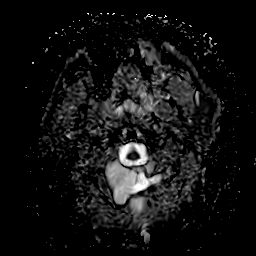
[im 28/56]
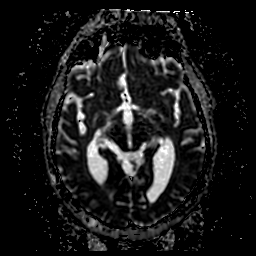
[im 56/56]
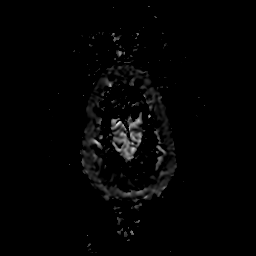

[Series 310: DWI · coronal · 4.0mm · 0.94mm/px · 4 of 74 slices shown (4 of 4)]
[im 1/74]
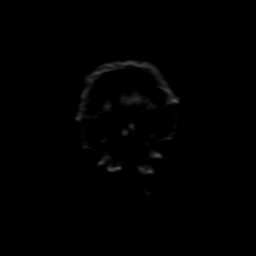
[im 25/74]
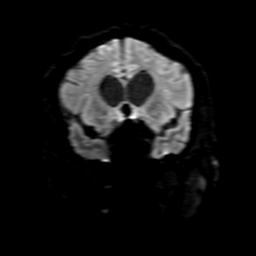
[im 49/74]
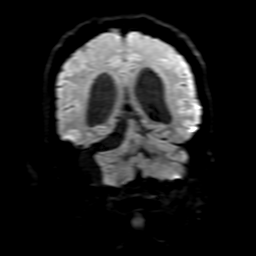
[im 74/74]
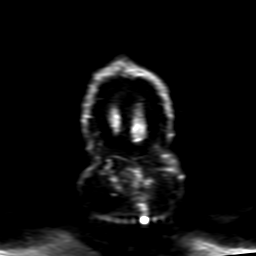

[Series 350: ADC · coronal · 4.0mm · 0.94mm/px · 2 of 37 slices shown (2 of 2)]
[im 1/37]
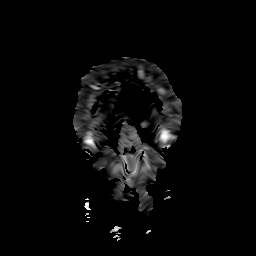
[im 37/37]
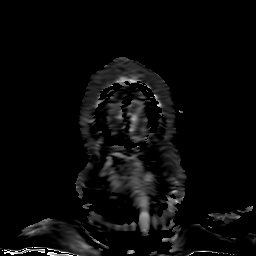

[33 of 48 positions shown; findings below may reference images not displayed]

FINDINGS: Brain: Postoperative changes are identified with contraction of
hemosiderin lined left cerebellar resection cavity extending
superiorly into the vermis where there is a nodular focus of
enhancement now measuring 6 x 5 mm (previously 8 x 7 mm). Increased
thin enhancement along the resection cavity margins. Adjacent T2
FLAIR hyperintensity likely reflects gliosis.

There is a CSF intensity extra-axial collection along the right
lateral cerebellar convexity extending superiorly to the
undersurface of the tentorium with mass effect on the adjacent
cerebellum including slight ascending transtentorial herniation.
There is also partial effacement of the fourth ventricle. Third and
lateral ventricle caliber has increased suggesting a degree of
hydrocephalus.

No acute infarction. Persistent small foci of fat within the frontal
horns. Probable residual chronic blood products in the occipital
horns. Foci of susceptibility along the medial right thalamus,
lateral right thalamus, right putamen and right subinsular white
matter likely reflect foci of chronic hemorrhage. There is likely
scattered sulcal susceptibility reflecting superficial siderosis.

Vascular: Major vessel flow voids at the skull base are preserved.

Skull and upper cervical spine: Suboccipital craniectomy. Normal
marrow signal is preserved.

Sinuses/Orbits: Paranasal sinus mucosal thickening. Orbits are
unremarkable.

Other: There is a fluid collection in the suboccipital scalp
measuring approximately 5.3 x 6.2 x 4.1 cm extending dorsal to the
posterior arch of C1. Likely reflects a pseudomeningocele. Sella is
unremarkable. Mastoid air cells are clear.
IMPRESSION: Contraction of left cerebellar resection cavity with increased thin
enhancement along the margins. Persistent but decreased size of
nodular enhancement at the deep margin. Continued attention on
follow-up.

New CSF intensity extra-axial collection along the right lateral
cerebellar convexity extending to the undersurface of the tentorium.
Resulting mass effect including partial effacement the fourth
ventricle.

Increased size of third and lateral ventricles suggesting
hydrocephalus.

Suboccipital scalp fluid collection extending dorsal to posterior
arch of C1 likely reflecting a pseudomeningocele. May communicate
with above extra-axial collection.

Persistent small foci of fat within the frontal horns, presumably
postoperative.

## 2020-08-25 MED ORDER — GADOBUTROL 1 MMOL/ML IV SOLN
7.5000 mL | Freq: Once | INTRAVENOUS | Status: AC | PRN
  Administered 2020-08-25: 7.5 mL via INTRAVENOUS

## 2020-08-25 NOTE — Progress Notes (Signed)
Subjective: The patient is alert and pleasant.  He has no complaints.  He is eating breakfast.  Objective: Vital signs in last 24 hours: Temp:  [97.4 F (36.3 C)-99.6 F (37.6 C)] 97.4 F (36.3 C) (01/09 0800) Pulse Rate:  [61-84] 65 (01/09 0800) Resp:  [12-19] 17 (01/09 0800) BP: (118-149)/(67-92) 132/86 (01/09 0800) SpO2:  [87 %-95 %] 93 % (01/09 0800) Estimated body mass index is 24.84 kg/m as calculated from the following:   Height as of this encounter: 5\' 8"  (1.727 m).   Weight as of this encounter: 74.1 kg.   Intake/Output from previous day: 01/08 0701 - 01/09 0700 In: 1274.9 [I.V.:1274.9] Out: 3800 [Urine:3800] Intake/Output this shift: Total I/O In: 1172.7 [P.O.:200; I.V.:972.7] Out: -   Physical exam the patient is alert and oriented.  He is moving all 4 extremities well.  Lab Results: Recent Labs    08/23/20 2250  WBC 11.8*  HGB 15.0  HCT 41.9  PLT 313   BMET Recent Labs    08/23/20 2250  NA 135  K 3.9  CL 96*  CO2 29  GLUCOSE 169*  BUN 6*  CREATININE 0.59*  CALCIUM 8.9    Studies/Results: DG Chest Port 1 View  Result Date: 08/23/2020 CLINICAL DATA:  Chest pain of uncertain etiology. EXAM: PORTABLE CHEST 1 VIEW COMPARISON:  None. FINDINGS: The heart size and mediastinal contours are within normal limits. Both lungs are clear. The visualized skeletal structures are unremarkable. IMPRESSION: No active disease. Electronically Signed   By: Dorise Bullion III M.D   On: 08/23/2020 19:14    Assessment/Plan: Posterior fossa cyst: I will get a brain MRI to better characterize the cyst and whether or not it needs further treatment.  LOS: 2 days     Ophelia Charter 08/25/2020, 9:04 AM

## 2020-08-26 DIAGNOSIS — L8990 Pressure ulcer of unspecified site, unspecified stage: Secondary | ICD-10-CM | POA: Insufficient documentation

## 2020-08-26 NOTE — Evaluation (Addendum)
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 Plan for VP shunt placement. PMH includes: HTN and GERD.  Clinical Impression  Pt in bed upon arrival of PT, agreeable to evaluation at this time. Prior to admission the pt was living alone at home and reports independence with rollator for mobility and ADLs. However, the pt also reports multiple falls at home with limited ability to call for help when he does fall. The pt now presents with limitations in functional mobility, coordination, motor planning, strength, power, and stability due to above dx, and will continue to benefit from skilled PT to address these deficits. The pt was able to complete sit-stand transfers with modA, but requires significant assist to steady, manage rollator, and cue for all steps/sequencing to manage short steps from recliner to bed. The pt will continue to benefit from skilled PT acutely, but will also need continued 24/7 supervision and rehab to improve safety with mobility following d/c.      Follow Up Recommendations SNF    Equipment Recommendations   (defer to post acute)    Recommendations for Other Services       Precautions / Restrictions Precautions Precautions: Fall Precaution Comments: pt reports falling 1x/month at home Restrictions Weight Bearing Restrictions: No      Mobility  Bed Mobility Overal bed mobility: Needs Assistance Bed Mobility: Sit to Supine       Sit to supine: Min assist   General bed mobility comments: significant verbal cues for sequencing, also minA to BLE and to adjust in bed    Transfers Overall transfer level: Needs assistance Equipment used: 4-wheeled walker Transfers: Sit to/from [facility] Sit to Stand: Mod  assist Stand pivot transfers: Mod assist       General transfer comment: modA to power up with significant verbal cues for hand placement and sequencing, then modA to steady and cue for each step while also assisting with rollator to step from recliner to bed  Ambulation/Gait Ambulation/Gait assistance: Mod assist Gait Distance (Feet): 5 Feet Assistive device: 4-wheeled walker Gait Pattern/deviations: Step-through pattern;Decreased stride length;Ataxic;Shuffle;Narrow base of support Gait velocity: decreased   General Gait Details: small steps with minimal clearance and poor motor control of placement bilaterally. benefits from cueing for each step, with modA to steady and manage rollator movement     Balance Overall balance assessment: Needs assistance Sitting-balance support: Bilateral upper extremity supported Sitting balance-Leahy Scale: Poor Sitting balance - Comments: reliant on BUE and modA at times to maintain upright Postural control: Posterior lean Standing balance support: Bilateral upper extremity supported Standing balance-Leahy Scale: Poor Standing balance comment: reliant on BUE support                             Pertinent Vitals/Pain Pain Assessment: No/denies pain    Home Living Family/patient expects to be discharged to:: Skilled nursing facility Living Arrangements: Alone               Additional Comments: pt from home alone where his older brothers check on him "sometimes" and provide all groceries and errands. the pt states they check on him, but "not enough" and he has been "lucky" a few times that they happened to come by when they did as he  had fallen with no way of contacting for help and he could have been down for a while if it wasnt for his brothers stopping by.    Prior Function Level of Independence: Independent with assistive device(s)         Comments: pt reports independence with mobility and ADLs, relies on his brothers  for errands and food, but reports they dont come often. pt also reports multiple falls at home, and that he uses rollator for mobility in the home, but did not know how to use device in session.     Hand Dominance   Dominant Hand: Right    Extremity/Trunk Assessment   Upper Extremity Assessment Upper Extremity Assessment: Defer to OT evaluation (generally poor coordination, L worse than R, but able to use functionally and reach with BUE)    Lower Extremity Assessment Lower Extremity Assessment: Generalized weakness;RLE deficits/detail;LLE deficits/detail RLE Sensation: WNL RLE Coordination: decreased fine motor;decreased gross motor LLE Sensation: WNL LLE Coordination: decreased fine motor;decreased gross motor    Cervical / Trunk Assessment Cervical / Trunk Assessment: Normal  Communication   Communication: No difficulties  Cognition Arousal/Alertness: Awake/alert Behavior During Therapy: WFL for tasks assessed/performed Overall Cognitive Status: No family/caregiver present to determine baseline cognitive functioning Area of Impairment: Memory;Safety/judgement;Awareness;Problem solving                     Memory: Decreased short-term memory;Decreased recall of precautions   Safety/Judgement: Decreased awareness of safety;Decreased awareness of deficits Awareness: Intellectual Problem Solving: Slow processing;Decreased initiation;Requires verbal cues General Comments: pt with inconsistent reports in regards to mobility at home, demos poor safety understanding and insight to ask for assistance      General Comments General comments (skin integrity, edema, etc.): VSS on RA    Exercises     Assessment/Plan    PT Assessment Patient needs continued PT services  PT Problem List Decreased strength;Decreased range of motion;Decreased activity tolerance;Decreased balance;Decreased mobility;Decreased coordination;Decreased cognition;Decreased knowledge of use of  DME;Decreased safety awareness       PT Treatment Interventions DME instruction;Gait training;Stair training;Functional mobility training;Therapeutic activities;Therapeutic exercise;Balance training;Neuromuscular re-education;Patient/family education;Cognitive remediation    PT Goals (Current goals can be found in the Care Plan section)  Acute Rehab PT Goals Patient Stated Goal: get back to bed PT Goal Formulation: With patient Time For Goal Achievement: 09/09/20 Potential to Achieve Goals: Good    Frequency Min 3X/week   Barriers to discharge Decreased caregiver support         AM-PAC PT "6 Clicks" Mobility  Outcome Measure Help needed turning from your back to your side while in a flat bed without using bedrails?: A Little Help needed moving from lying on your back to sitting on the side of a flat bed without using bedrails?: A Little Help needed moving to and from a bed to a chair (including a wheelchair)?: A Lot Help needed standing up from a chair using your arms (e.g., wheelchair or bedside chair)?: A Lot Help needed to walk in hospital room?: A Lot Help needed climbing 3-5 steps with a railing? : Total 6 Click Score: 13    End of Session Equipment Utilized During Treatment: Gait belt Activity Tolerance: Patient tolerated treatment well Patient left: in bed;with call bell/phone within reach;with bed alarm set Nurse Communication: Mobility status PT Visit Diagnosis: Unsteadiness on feet (R26.81);Other abnormalities of gait and mobility (R26.89);Muscle weakness (generalized) (M62.81)    Time: [id number] PT Time Calculation (min) (ACUTE ONLY): 20 min   Charges:  PT Evaluation $PT Eval Moderate Complexity: 1 Mod          Karma Ganja, PT, DPT   Acute Rehabilitation Department Pager #: [phone]  Otho Bellows 08/26/2020, 6:13 PM

## 2020-08-26 NOTE — Progress Notes (Signed)
  NEUROSURGERY PROGRESS NOTE   No issues overnight.  Eating breakfast. No concerns.  EXAM:  BP 110/83   Pulse 76   Temp 97.9 F (36.6 C) (Oral)   Resp 11   Ht 5\' 8"  (1.727 m)   Wt 74.1 kg   SpO2 95%   BMI 24.84 kg/m   Awake, alert Speech fluent CN grossly intact  5/5 BUE/BLE   IMPRESSION/PLAN 70 y.o. 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 Will need VPS placement. Will discuss with general surgery regarding timing.

## 2020-08-27 NOTE — TOC Initial Note (Signed)
Transition of Care [facility]) - Initial/Assessment Note    Patient Details  Name: Jose Ramirez MRN: [id number] Date of Birth: [birth year]  Transition of Care [facility]) CM/SW Contact:    Joanne Chars, LCSW Phone Number: 08/27/2020, 4:14 PM  Clinical Narrative:   CSW met with pt regarding discharge plan.  Pt agreeable to SNF recommendation but reports he was just at [facility] around Thanksgiving for multiple weeks.  Pt does have questions about insurance coverage.  Choice document given. Permission given to speak with pt brothers and to send out referral for SNF.  Pt is vaccinated for covid but has not had booster.                  Expected Discharge Plan: Skilled Nursing Facility Barriers to Discharge: Continued Medical Work up,SNF Pending bed offer   Patient Goals and CMS Choice Patient states their goals for this hospitalization and ongoing recovery are:: "get back to normal life" CMS Medicare.gov Compare Post Acute Care list provided to:: Patient Choice offered to / list presented to : Patient  Expected Discharge Plan and Services Expected Discharge Plan: [place] Choice: [place] arrangements for the past 2 months: Single Family Home                                      Prior Living Arrangements/Services Living arrangements for the past 2 months: Single Family Home Lives with:: Self Patient language and need for interpreter reviewed:: Yes Do you feel safe going back to the place where you live?: Yes      Need for Family Participation in Patient Care: No (Comment) Care giver support system in place?: Yes (comment) Current home services: Other (comment) (none) Criminal Activity/Legal Involvement Pertinent to Current Situation/Hospitalization: No - Comment as needed  Activities of Daily Living Home Assistive Devices/Equipment: Walker (specify type) (rollator) ADL Screening (condition at time of  admission) Patient's cognitive ability adequate to safely complete daily activities?: Yes Is the patient deaf or have difficulty hearing?: No Does the patient have difficulty seeing, even when wearing glasses/contacts?: Yes (pt reports slight blurry vision) Does the patient have difficulty concentrating, remembering, or making decisions?: No Patient able to express need for assistance with ADLs?: Yes Does the patient have difficulty dressing or bathing?: No Independently performs ADLs?: Yes (appropriate for developmental age) Does the patient have difficulty walking or climbing stairs?: Yes Weakness of Legs: Both Weakness of Arms/Hands: None  Permission Sought/Granted Permission sought to share information with : Family Chief Financial Officer Permission granted to share information with : Yes, Verbal Permission Granted  Share Information with NAME: brother tommy, roger  Permission granted to share info w AGENCY: snf        Emotional Assessment Appearance:: Appears stated age Attitude/Demeanor/Rapport: Engaged Affect (typically observed): Appropriate,Pleasant Orientation: : Oriented to Self,Oriented to Place,Oriented to  Time,Oriented to Situation Alcohol / Substance Use: Not Applicable Psych Involvement: No (comment)  Admission diagnosis:  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 Pressure injury of skin 08/26/2020  . 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 Cerebellar tumor ([place]) 06/07/2020   PCP:  Cyndi Bender, PA-C Pharmacy:   [id number] [place], [place] [address]  [place] [address] Phone: [phone] Fax: [phone]     Social Determinants of Health (SDOH) Interventions    Readmission Risk Interventions No flowsheet data found.

## 2020-08-27 NOTE — Progress Notes (Signed)
Nutrition Follow-up  INTERVENTION:   -Ensure Enlive po BID, each supplement provides 350 kcal and 20 grams of protein  NUTRITION DIAGNOSIS:   Inadequate oral intake related to nausea,vomiting as evidenced by per patient/family report.  GOAL:   Patient will meet greater than or equal to 90% of their needs  MONITOR:   PO intake,Supplement acceptance,Labs,Weight trends,I & O's  REASON FOR ASSESSMENT:   Malnutrition Screening Tool    ASSESSMENT:   70 y.o. 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  Pt discussed during ICU rounds and with RN.  Plan for VP shunt Thursday 1/13   Meal Completion: 35-100%   Per chart review, pt reported to staff that he developed nausea and vomiting upon completion of rehab in November 2021 after having craniotomy in October 2021.   Labs reviewed. Medications: senokot  Diet Order:   Diet Order            Diet regular Room service appropriate? Yes with Assist; Fluid consistency: Thin  Diet effective now                 EDUCATION NEEDS:   No education needs have been identified at this time  Skin:  Skin Assessment: Skin Integrity Issues: Skin Integrity Issues:: Stage I Stage I: coccyx  Last BM:  PTA  Height:   Ht Readings from Last 1 Encounters:  08/23/20 5\' 8"  (1.727 m)    Weight:   Wt Readings from Last 1 Encounters:  08/23/20 74.1 kg   BMI:  Body mass index is 24.84 kg/m.  Estimated Nutritional Needs:   Kcal:  1850-2050  Protein:  85-100g  Fluid:  2L/day  Lockie Pares., RD, LDN, CNSC See AMiON for contact information

## 2020-08-27 NOTE — NC FL2 (Signed)
[place] LEVEL OF CARE SCREENING TOOL     IDENTIFICATION  Patient Name: Jose Ramirez Birthdate: 1951-04-19 Sex: male Admission Date (Current Location): 08/23/2020  [place] and [place] Number:  Kathleen Argue [id number] [place] and Address:  [facility] [place], [place] [address], [place], [place] [address]      Provider Number: [id number]  Attending Physician Name and Address:  Consuella Lose, MD  Relative Name and Phone Number:  DECKLAN, MAU [phone]    Current Level of Care: Hospital Recommended Level of Care: [place] Prior Approval Number:    Date Approved/Denied:   PASRR Number: [id number]  Discharge Plan: SNF    Current Diagnoses: Patient Active Problem List   Diagnosis Date Noted  . Pressure injury of skin 08/26/2020  . 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  See discharge summary)  AMBULATORY STATUS COMMUNICATION OF NEEDS Skin   Limited Assist Verbally Other (Comment) (pressure injury)                       Personal Care Assistance Level of Assistance  Bathing,Feeding,Dressing Bathing Assistance: Limited assistance Feeding assistance: Independent Dressing Assistance: Limited assistance     Functional Limitations Info  Sight,Hearing,Speech Sight Info: Adequate Hearing Info: Adequate Speech Info: Adequate    SPECIAL CARE FACTORS FREQUENCY  PT (By licensed PT),OT (By licensed OT)     PT Frequency: 5x week OT Frequency: 5x week            Contractures Contractures Info: Not present    Additional Factors Info  Code Status,Allergies Code Status Info:  full Allergies Info: nka           Current Medications (08/27/2020):  This is the current hospital active medication list Current Facility-Administered Medications  Medication Dose Route Frequency Provider Last Rate Last Admin  . 0.9 %  sodium chloride infusion   Intravenous Continuous Ashok Pall, MD   Stopped at 08/25/20 1055  . acetaminophen (TYLENOL) tablet 650 mg  650 mg Oral Q6H PRN Ashok Pall, MD       Or  . acetaminophen (TYLENOL) suppository 650 mg  650 mg Rectal Q6H PRN Ashok Pall, MD      . Chlorhexidine Gluconate Cloth 2 % PADS 6 each  6 each Topical Daily Ashok Pall, MD   6 each at 08/27/20 1210  . feeding supplement (ENSURE ENLIVE / ENSURE PLUS) liquid 237 mL  237 mL Oral BID BM Consuella Lose, MD   237 mL at 08/27/20 1455  . heparin injection 5,000 Units  5,000 Units Subcutaneous Q8H Ashok Pall, MD   5,000 Units at 08/27/20 1455  . HYDROcodone-acetaminophen (NORCO/VICODIN) 5-325 MG per tablet 1-2 tablet  1-2 tablet Oral Q4H PRN Ashok Pall, MD      . meclizine (ANTIVERT) tablet 25 mg  25 mg Oral BID PRN Ashok Pall, MD   25 mg at 08/26/20 0819  . metoprolol tartrate (LOPRESSOR) tablet 50 mg  50 mg Oral BID Ashok Pall, MD   50 mg at 08/27/20 0927  . multivitamin with minerals tablet 1 tablet  1 tablet Oral Daily Ashok Pall, MD   1 tablet at  08/27/20 0927  . ondansetron (ZOFRAN) tablet 4 mg  4 mg Oral Q6H PRN Ashok Pall, MD       Or  . ondansetron (ZOFRAN) injection 4 mg  4 mg Intravenous Q6H PRN Ashok Pall, MD   4 mg at 08/26/20 0819  . senna (SENOKOT) tablet 8.6 mg  1 tablet Oral BID Ashok Pall, MD   8.6 mg at 08/27/20 0927  . simvastatin (ZOCOR) tablet 40 mg  40 mg Oral QHS Ashok Pall, MD   40 mg at 08/26/20 2137  . traZODone (DESYREL) tablet 150 mg  150 mg Oral QHS Ashok Pall, MD   150 mg at 08/26/20 2137     Discharge Medications: Please see discharge summary for a list of discharge medications.  Relevant Imaging  Results:  Relevant Lab Results:   Additional Information [id number]  Joanne Chars, LCSW

## 2020-08-27 NOTE — Progress Notes (Signed)
Patient was asleep and awaken from a nightmare then proceeded to try to get out of bed. Patient was unaware of where he was and had an vomiting episode. Patient given Zofran and doctor notified.   Hart [place], Therapist, sports.

## 2020-08-27 NOTE — Progress Notes (Signed)
  NEUROSURGERY PROGRESS NOTE   No issues overnight.  Anxious for surgery  EXAM:  BP 110/75   Pulse 72   Temp 98.6 F (37 C) (Oral)   Resp 16   Ht 5\' 8"  (1.727 m)   Wt 74.1 kg   SpO2 96%   BMI 24.84 kg/m   Awake, alert, oriented  Speech fluent, appropriate  CN grossly intact  5/5 BUE/BLE   IMPRESSION/PLAN 70 y.o. 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 Plan VPS Thursday.

## 2020-08-27 NOTE — Progress Notes (Signed)
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 Plan for VP shunt placement. PMH includes: HTN and GERD.   Clinical Impression   Prior to surgery in October pt was independent, living alone and enjoyed riding motorcycles. Pt having hiccups throughout session. Pt currently requires mod A with mobility and ADL tasks due to generalized weakness, apparent ataxia, complaints of dizziness/central vertigo and deficits listed below. Began education regarding habituation exercises (gaze stabilization) for central vertigo and education regarding compensatory techniques (maintaining focal point) to reduce complaints of dizziness/nausea during mobility.Plan is for VPS this Thursday. Unsure of social support system, however feel pt is an excellent candidate for rehab at [facility]. Will follow acutely to maximize funcitonal level of independence and facilitate DC to next level of care.     Follow Up Recommendations  CIR;Supervision/Assistance - 24 hour    Equipment Recommendations  3 in 1 bedside commode;Other (comment) (will further assess)    Recommendations for Other Services Rehab consult     Precautions / Restrictions Precautions Precautions: Fall Precaution Comments: pt reports falling 1x/month at home      Mobility Bed Mobility Overal bed mobility: Needs Assistance Bed Mobility: Supine to Sit     Supine to sit: Supervision          Transfers   Equipment used: 1 person hand held assist Transfers: Squat Pivot Transfers     Squat pivot transfers: Mod assist          Balance     Sitting balance-Leahy Scale: Poor       Standing balance-Leahy Scale: Poor                             ADL either performed or assessed with clinical  judgement   ADL Overall ADL's : Needs assistance/impaired Eating/Feeding: Set up;Sitting   Grooming: Set up;Supervision/safety;Sitting   Upper Body Bathing: Set up;Supervision/ safety;Sitting   Lower Body Bathing: Moderate assistance;Sit to/from stand   Upper Body Dressing : Minimal assistance;Sitting   Lower Body Dressing: Moderate assistance;Sit to/from stand   Toilet Transfer: Moderate assistance Toilet Transfer Details (indicate cue type and reason): simulated         Functional mobility during ADLs: Moderate assistance       Vision Baseline Vision/History: Wears glasses Wears Glasses: Reading only Patient Visual Report: Blurring of vision Vision Assessment?: Vision impaired- to be further tested in functional context;Yes Eye Alignment: Within Functional Limits Alignment/Gaze Preference: Within Defined Limits Tracking/Visual Pursuits: Decreased smoothness of vertical tracking;Decreased smoothness of horizontal tracking Saccades: Additional eye shifts occurred during testing Visual Fields: No apparent deficits Additional Comments: blurring of vision; ? nystagmus in R gaze     Perception Perception Comments: [facility]   Praxis Praxis Praxis-Other Comments:  (sensory motor deficits noted LUE)    Pertinent Vitals/Pain Pain Assessment: Faces Faces Pain Scale: Hurts a little bit Pain Location: nauseated Pain Descriptors / Indicators: Discomfort Pain Intervention(s): Limited activity within patient's tolerance     Hand Dominance Right   Extremity/Trunk Assessment Upper Extremity Assessment Upper Extremity Assessment: LUE deficits/detail LUE Deficits / Details: ROM WFL; ataxic movements but attempts to use functionally; "clumsy hand" LUE Coordination: decreased fine motor;decreased gross motor   Lower Extremity Assessment Lower Extremity Assessment: Defer to PT evaluation  Cervical / Trunk Assessment Cervical / Trunk Assessment: Other exceptions (trunkal ataxia  noted)   Communication Communication Communication: No difficulties   Cognition Arousal/Alertness: Awake/alert Behavior During Therapy: WFL for tasks assessed/performed Overall Cognitive Status: No family/caregiver present to determine baseline cognitive functioning Area of Impairment: Attention;Memory;Safety/judgement;Awareness;Problem solving                   Current Attention Level: Selective Memory: Decreased short-term memory   Safety/Judgement: Decreased awareness of safety;Decreased awareness of deficits Awareness: Emergent Problem Solving: Slow processing  Will further assess Good insight into needing increased assistance with mobility and knowing that he has "gone downhill" since his original surgery. Able to report that he is having a shunt placed for fluid in his brain     General Comments  VSS on RA    Exercises Exercises: Other exercises Other Exercises Other Exercises: began gaze stabilization exercises for central vertigo Other Exercises: Began education regarding using focal point during mobility to reduce complaints of dizziness/nausea Other Exercises: encouraged general bed level exericses   Shoulder Instructions      Home Living Family/patient expects to be discharged to:: Skilled nursing facility                                        Prior Functioning/Environment Level of Independence: Independent with assistive device(s)        Comments: pt reports independence with mobility and ADLs, relies on his brothers for errands and food, but reports they dont come often. pt also reports multiple falls at home, and that he uses rollator for mobility in the home        OT Problem List: Decreased strength;Decreased activity tolerance;Impaired balance (sitting and/or standing);Impaired vision/perception;Decreased coordination;Decreased cognition;Decreased safety awareness;Decreased knowledge of use of DME or AE;Impaired  sensation;Impaired UE functional use;Pain      OT Treatment/Interventions: Self-care/ADL training;Therapeutic exercise;Neuromuscular education;Energy conservation;DME and/or AE instruction;Therapeutic activities;Cognitive remediation/compensation;Visual/perceptual remediation/compensation;Patient/family education;Balance training    OT Goals(Current goals can be found in the care plan section) Acute Rehab OT Goals Patient Stated Goal: to get back to living life OT Goal Formulation: With patient Time For Goal Achievement: 09/10/20 Potential to Achieve Goals: Good  OT Frequency: Min 2X/week   Barriers to D/C:            Co-evaluation              AM-PAC OT "6 Clicks" Daily Activity     Outcome Measure Help from another person eating meals?: A Little Help from another person taking care of personal grooming?: A Little Help from another person toileting, which includes using toliet, bedpan, or urinal?: A Lot Help from another person bathing (including washing, rinsing, drying)?: A Lot Help from another person to put on and taking off regular upper body clothing?: A Little Help from another person to put on and taking off regular lower body clothing?: A Lot 6 Click Score: 15   End of Session Equipment Utilized During Treatment: Gait belt Nurse Communication: Mobility status;Other (comment) (encourage habituation exercises; rec incentive spirometer)  Activity Tolerance: Patient tolerated treatment well Patient left: in chair;with call bell/phone within reach;with chair alarm set  OT Visit Diagnosis: Unsteadiness on feet (R26.81);Other abnormalities of gait and mobility (R26.89);Muscle weakness (generalized) (M62.81);History of falling (Z91.81);Ataxia, unspecified (R27.0);Other symptoms and signs involving cognitive function;Pain;Dizziness and giddiness (R42) Pain - part of body:  (nausea)  Time: 2585-2778 OT Time Calculation (min): 25 min Charges:  OT General  Charges $OT Visit: 1 Visit OT Evaluation $OT Eval Moderate Complexity: 1 Mod OT Treatments $Self Care/Home Management : 8-22 mins  Maurie Boettcher, OT/L   Acute OT Clinical Specialist Acute Rehabilitation Services Pager [phone] Office [phone]   [facility] 08/27/2020, 3:01 PM

## 2020-08-28 ENCOUNTER — Inpatient Hospital Stay: Admission: RE | Admit: 2020-08-28 | Payer: Medicare Other | Source: Ambulatory Visit

## 2020-08-28 LAB — CBC
HCT: 44.3 % (ref 39.0–52.0)
Hemoglobin: 15.4 g/dL (ref 13.0–17.0)
MCH: 31.6 pg (ref 26.0–34.0)
MCHC: 34.8 g/dL (ref 30.0–36.0)
MCV: 91 fL (ref 80.0–100.0)
Platelets: 342 10*3/uL (ref 150–400)
RBC: 4.87 MIL/uL (ref 4.22–5.81)
RDW: 13.3 % (ref 11.5–15.5)
WBC: 11.3 10*3/uL — ABNORMAL HIGH (ref 4.0–10.5)
nRBC: 0 % (ref 0.0–0.2)

## 2020-08-28 LAB — BASIC METABOLIC PANEL
Anion gap: 12 (ref 5–15)
BUN: 9 mg/dL (ref 8–23)
CO2: 27 mmol/L (ref 22–32)
Calcium: 9.1 mg/dL (ref 8.9–10.3)
Chloride: 95 mmol/L — ABNORMAL LOW (ref 98–111)
Creatinine, Ser: 0.62 mg/dL (ref 0.61–1.24)
GFR, Estimated: >60 mL/min (ref >60)
Glucose, Bld: 152 mg/dL — ABNORMAL HIGH (ref 70–99)
Potassium: 4.2 mmol/L (ref 3.5–5.1)
Sodium: 134 mmol/L — ABNORMAL LOW (ref 135–145)

## 2020-08-28 MED ORDER — CHLORPROMAZINE HCL 25 MG PO TABS
50.0000 mg | ORAL_TABLET | Freq: Three times a day (TID) | ORAL | Status: DC | PRN
  Administered 2020-08-28: 50 mg via ORAL
  Filled 2020-08-28 (×2): qty 2

## 2020-08-28 MED ORDER — CEFAZOLIN SODIUM-DEXTROSE 2-4 GM/100ML-% IV SOLN: 2.0000 g | INTRAVENOUS | Status: DC

## 2020-08-28 NOTE — Consult Note (Signed)
[facility] Surgery Consult Note  Jose Ramirez 02/16/1951  [id number].    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 States that he was discharged to rehab after surgery last October. Since that time he has had persistent nausea. States that he had one bout of large volume emesis yesterday. He felt a little nauseated this morning but tolerated breakfast without any emesis. Zofran helps. Denies abdominal pain.  MRI brain 08/25/20 shows increased size of third and lateral ventricles suggesting hydrocephalus; new CSF intensity extra-axial collection along the right lateral cerebellar convexity extending to the undersurface of the tentorium resulting mass effect including partial effacement the fourth ventricle. He is scheduled for VP shunt placement tomorrow with Dr. Kathyrn Sheriff. We are asked to assist.   Abdominal surgical history: none Anticoagulants: none Nonsmoker Lives at home independently, plans to d/c to SNF after this hospitalization Ambulates with rollator  Review of Systems  Constitutional: Positive for weight loss. Negative for fever.  Respiratory: Negative.   Cardiovascular: Negative.   Gastrointestinal: Positive for nausea and vomiting. Negative for abdominal pain, constipation and diarrhea.  Genitourinary: Negative.   Musculoskeletal: Negative.    All systems reviewed and otherwise negative except for as above  History reviewed. No pertinent family history.  Past Medical History:  Diagnosis Date  . GERD (gastroesophageal reflux disease)   . Hypertension     Past Surgical History:  Procedure Laterality Date  . CRANIOTOMY N/A 06/07/2020   Procedure: SUBOCCIPITAL CRANIECTOMY  FOR RESECTION OF CEREBELLAR TUMOR;  Surgeon: Consuella Lose, MD;  Location: [place];  Service: Neurosurgery;  Laterality: N/A;  SUBOCCIPITAL CRANIECTOMY FOR RESECTION OF CEREBELLAR TUMOR  . NO PAST SURGERIES      Social History:  reports that he has never smoked. He has never used smokeless tobacco. He reports previous alcohol use. He reports previous drug use.  Allergies: No Known Allergies  Medications Prior to Admission  Medication Sig Dispense Refill  . meclizine (ANTIVERT) 25 MG tablet Take 25 mg by mouth 2 (two) times daily as needed for dizziness or nausea.    . metoprolol tartrate (LOPRESSOR) 50 MG tablet Take 50 mg by mouth 2 (two) times daily.     . Multiple Vitamin (MULTIVITAMIN WITH MINERALS) TABS tablet Take 1 tablet by mouth daily.    . nitrofurantoin, macrocrystal-monohydrate, (MACROBID) 100 MG capsule Take 100 mg by mouth 2 (two) times daily.    . Omega-3 Fatty Acids (FISH OIL) 1000 MG CAPS Take 2,000 mg by mouth in the morning and at bedtime.    . simvastatin (ZOCOR) 40 MG tablet Take 40 mg by mouth at bedtime.    . traZODone (DESYREL) 150 MG tablet Take 150-300 mg by mouth at bedtime.    Marland Kitchen oxyCODONE-acetaminophen (PERCOCET) 7.5-325 MG tablet Take 1 tablet by mouth every 4 (four) hours as needed for severe pain. (Patient not taking: No sig reported) 30 tablet 0    Prior to Admission medications   Medication Sig Start Date End Date Taking? Authorizing Provider  meclizine (ANTIVERT) 25 MG tablet Take 25 mg by mouth 2 (two) times daily as needed for dizziness or nausea. 07/17/20  Yes [provider]  metoprolol tartrate (LOPRESSOR) 50 MG tablet Take 50 mg by mouth 2 (two) times daily.  03/26/20  Yes [provider]  Multiple Vitamin (MULTIVITAMIN WITH MINERALS) TABS tablet Take 1 tablet by mouth daily.   Yes [provider]  nitrofurantoin, macrocrystal-monohydrate, (MACROBID) 100 MG capsule Take 100 mg by mouth 2 (two) times daily. 08/19/20  Yes  [provider]  Omega-3 Fatty Acids (FISH OIL) 1000 MG CAPS Take 2,000 mg by mouth in the morning and at bedtime.   Yes [provider]  simvastatin (ZOCOR) 40 MG tablet Take 40 mg by mouth at bedtime. 02/13/20  Yes [provider]  traZODone (DESYREL) 150 MG tablet Take 150-300 mg by mouth at bedtime. 03/26/20  Yes [provider]  oxyCODONE-acetaminophen (PERCOCET) 7.5-325 MG tablet Take 1 tablet by mouth every 4 (four) hours as needed for severe pain. Patient not taking: No sig reported 06/12/20   Costella, Vista Mink, PA-C    Blood pressure 113/67, pulse 73, temperature 97.8 F (36.6 C), temperature source Oral, resp. rate 12, height 5\' 8"  (1.727 m), weight 74.1 kg, SpO2 91 %. Physical Exam: General: pleasant, WD/WN male who is laying in bed in NAD HEENT: head is normocephalic, atraumatic.  Sclera are noninjected.  PERRL.  Ears and nose without any masses or lesions.  Mouth is pink and moist. Dentition fair Heart: regular, rate, and rhythm.  Normal s1,s2. No obvious murmurs, gallops, or rubs noted.  Palpable pedal pulses bilaterally  Lungs: CTAB, no wheezes, rhonchi, or rales noted.  Respiratory effort nonlabored Abd: no incisions noted, soft, NT/ND, +BS, no masses, hernias, or organomegaly MS: no BUE/BLE edema, calves soft and nontender Skin: warm and dry with no masses, lesions, or rashes Psych: A&Ox4 with an appropriate affect Neuro: cranial nerves grossly intact, equal strength in BUE/BLE bilaterally, normal speech, thought process intact  No results found for this or any previous visit (from the past 48 hour(s)). No results found.  Anti-infectives (From admission, onward)   Start     Dose/Rate Route Frequency Ordered Stop   08/23/20 2200  nitrofurantoin (macrocrystal-monohydrate) (MACROBID) capsule 100 mg  Status:  Discontinued        100 mg Oral 2 times daily 08/23/20 1800 08/24/20 1404       Assessment/Plan HTN HLD GERD Remote CVA 20  years ago  H/o suboccipital craniectomy for resection of cerebellar tumor10/22/2021  Delayed hydrocephalus - Labs pending. OR tomorrow with Dr. Kathyrn Sheriff for VP shunt, Dr. Zenia Resides to assist for abdominal portion. NPO after midnight.  ID - none VTE - SCDs, sq heparin FEN - reg diet, NPO after MN Foley - none Follow up - TBD  Wellington Hampshire, Ku [facility] Surgery 08/28/2020, 8:56 AM Please see Amion for pager number during day hours 7:00am-4:30pm

## 2020-08-28 NOTE — Progress Notes (Addendum)
  NEUROSURGERY PROGRESS NOTE   Nausea with one bout of emesis overnight. NAE otherwise Patent attorney for surgery tomorrow  EXAM:  BP 113/67   Pulse 73   Temp 98.2 F (36.8 C) (Oral)   Resp 12   Ht 5\' 8"  (1.727 m)   Wt 74.1 kg   SpO2 91%   BMI 24.84 kg/m   Awake, alert, confused at times Speech fluent, appropriate  CN grossly intact  5/5 BUE/BLE   IMPRESSION/PLAN 70 y.o. male 2.5 months s/p suboccipital craniectomy for resection of cerebellar tumornowwith delayed hydrocephalus. Plan VPS tomorrow. - NPO at midnight    I have seen and examined Jose Ramirez and agree with the exam, impression, and plan as documented in the note above by Ferne Reus, PA-C.  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 Will plan on lap-assisted VPS tomorrow. I have reviewed the indications for surgery, associated risks including infection, bleeding, shunt malfunction, and need for additional surgeries with the patient. All his questions were answered. He provided verbal consent to proceed.  Consuella Lose, MD [facility]

## 2020-08-28 NOTE — Progress Notes (Incomplete)
Noticed in pt assessment o

## 2020-08-28 NOTE — Progress Notes (Signed)
PT Cancellation Note  Patient Details Name: Jose Ramirez MRN: [id number] DOB: 04-12-1951   Cancelled Treatment:    Reason Eval/Treat Not Completed: Patient declined, no reason specified.  I just don't feel well today, I'd rather not. 08/28/2020  [name], PT Acute Rehabilitation Services [phone]  (pager) [phone]  (office)   [name] 08/28/2020, 3:49 PM

## 2020-08-29 ENCOUNTER — Encounter (HOSPITAL_COMMUNITY)
Admission: EM | Disposition: A | Discharge status: Skilled Nursing Facility | Payer: Self-pay | Source: Other Acute Inpatient Hospital | Attending: Neurosurgery

## 2020-08-29 ENCOUNTER — Inpatient Hospital Stay (HOSPITAL_COMMUNITY): Payer: Medicare Other | Admitting: Certified Registered"

## 2020-08-29 ENCOUNTER — Encounter (HOSPITAL_COMMUNITY): Payer: Self-pay | Admitting: Neurosurgery

## 2020-08-29 HISTORY — PX: LAPAROSCOPIC REVISION VENTRICULAR-PERITONEAL (V-P) SHUNT: SHX5924

## 2020-08-29 HISTORY — PX: VENTRICULOPERITONEAL SHUNT: SHX204

## 2020-08-29 LAB — SARS CORONAVIRUS 2 BY RT PCR (HOSPITAL ORDER, PERFORMED IN ~~LOC~~ HOSPITAL LAB): SARS Coronavirus 2: NEGATIVE

## 2020-08-29 SURGERY — SHUNT INSERTION VENTRICULAR-PERITONEAL
Anesthesia: General

## 2020-08-29 MED ORDER — ONDANSETRON HCL 4 MG PO TABS
4.0000 mg | ORAL_TABLET | ORAL | Status: DC | PRN
  Administered 2020-09-03 – 2020-09-05 (×4): 4 mg via ORAL
  Filled 2020-08-29 (×5): qty 1

## 2020-08-29 MED ORDER — FENTANYL CITRATE (PF) 250 MCG/5ML IJ SOLN
INTRAMUSCULAR | Status: AC
  Filled 2020-08-29: qty 5

## 2020-08-29 MED ORDER — SUGAMMADEX SODIUM 200 MG/2ML IV SOLN
INTRAVENOUS | Status: DC | PRN
  Administered 2020-08-29: 200 mg via INTRAVENOUS

## 2020-08-29 MED ORDER — ACETAMINOPHEN 500 MG PO TABS: 1000.0000 mg | ORAL_TABLET | Freq: Once | ORAL | Status: AC

## 2020-08-29 MED ORDER — CHLORHEXIDINE GLUCONATE 0.12 % MT SOLN: 15.0000 mL | Freq: Once | OROMUCOSAL | Status: AC

## 2020-08-29 MED ORDER — FENTANYL CITRATE (PF) 100 MCG/2ML IJ SOLN
INTRAMUSCULAR | Status: DC | PRN
  Administered 2020-08-29 (×3): 50 ug via INTRAVENOUS

## 2020-08-29 MED ORDER — PANTOPRAZOLE SODIUM 40 MG IV SOLR
40.0000 mg | Freq: Every day | INTRAVENOUS | Status: DC
  Administered 2020-08-29 – 2020-08-30 (×2): 40 mg via INTRAVENOUS
  Filled 2020-08-29 (×2): qty 40

## 2020-08-29 MED ORDER — HYDROCODONE-ACETAMINOPHEN 5-325 MG PO TABS
1.0000 | ORAL_TABLET | ORAL | Status: DC | PRN
  Administered 2020-08-30 – 2020-09-02 (×7): 1 via ORAL
  Filled 2020-08-29 (×7): qty 1

## 2020-08-29 MED ORDER — PROMETHAZINE HCL 25 MG PO TABS
12.5000 mg | ORAL_TABLET | ORAL | Status: DC | PRN
  Administered 2020-09-03: 12.5 mg via ORAL
  Filled 2020-08-29: qty 1

## 2020-08-29 MED ORDER — BISACODYL 5 MG PO TBEC
5.0000 mg | DELAYED_RELEASE_TABLET | Freq: Every day | ORAL | Status: DC | PRN
  Administered 2020-09-01 – 2020-09-05 (×2): 5 mg via ORAL
  Filled 2020-08-29 (×3): qty 1

## 2020-08-29 MED ORDER — HYDROMORPHONE HCL 1 MG/ML IJ SOLN
0.2500 mg | INTRAMUSCULAR | Status: DC | PRN
  Administered 2020-08-29: 0.5 mg via INTRAVENOUS
  Administered 2020-08-29 (×2): 0.25 mg via INTRAVENOUS

## 2020-08-29 MED ORDER — POLYETHYLENE GLYCOL 3350 17 G PO PACK
17.0000 g | PACK | Freq: Every day | ORAL | Status: DC | PRN
  Administered 2020-09-01: 17 g via ORAL
  Filled 2020-08-29: qty 1

## 2020-08-29 MED ORDER — BUPIVACAINE-EPINEPHRINE (PF) 0.25% -1:200000 IJ SOLN
INTRAMUSCULAR | Status: AC
  Filled 2020-08-29: qty 20

## 2020-08-29 MED ORDER — PROPOFOL 10 MG/ML IV BOLUS
INTRAVENOUS | Status: AC
  Filled 2020-08-29: qty 20

## 2020-08-29 MED ORDER — ROCURONIUM BROMIDE 100 MG/10ML IV SOLN
INTRAVENOUS | Status: DC | PRN
  Administered 2020-08-29: 50 mg via INTRAVENOUS

## 2020-08-29 MED ORDER — BUPIVACAINE HCL 0.25 % IJ SOLN
INTRAMUSCULAR | Status: DC | PRN
  Administered 2020-08-29: 10 mL

## 2020-08-29 MED ORDER — ACETAMINOPHEN 500 MG PO TABS
ORAL_TABLET | ORAL | Status: AC
  Administered 2020-08-29: 1000 mg via ORAL
  Filled 2020-08-29: qty 2

## 2020-08-29 MED ORDER — LIDOCAINE-EPINEPHRINE 1 %-1:100000 IJ SOLN
INTRAMUSCULAR | Status: AC
  Filled 2020-08-29: qty 1

## 2020-08-29 MED ORDER — SENNA 8.6 MG PO TABS
1.0000 | ORAL_TABLET | Freq: Two times a day (BID) | ORAL | Status: DC
  Administered 2020-08-29 – 2020-09-06 (×17): 8.6 mg via ORAL
  Filled 2020-08-29 (×17): qty 1

## 2020-08-29 MED ORDER — 0.9 % SODIUM CHLORIDE (POUR BTL) OPTIME
TOPICAL | Status: DC | PRN
  Administered 2020-08-29: 1000 mL

## 2020-08-29 MED ORDER — BACITRACIN ZINC 500 UNIT/GM EX OINT
TOPICAL_OINTMENT | CUTANEOUS | Status: DC | PRN
  Administered 2020-08-29: 1 via TOPICAL

## 2020-08-29 MED ORDER — NALOXONE HCL 0.4 MG/ML IJ SOLN: 0.0800 mg | INTRAMUSCULAR | Status: DC | PRN

## 2020-08-29 MED ORDER — ORAL CARE MOUTH RINSE: 15.0000 mL | Freq: Once | OROMUCOSAL | Status: AC

## 2020-08-29 MED ORDER — FLEET ENEMA 7-19 GM/118ML RE ENEM: 1.0000 | ENEMA | Freq: Once | RECTAL | Status: DC | PRN

## 2020-08-29 MED ORDER — LIDOCAINE HCL (CARDIAC) PF 100 MG/5ML IV SOSY
PREFILLED_SYRINGE | INTRAVENOUS | Status: DC | PRN
  Administered 2020-08-29: 60 mg via INTRAVENOUS

## 2020-08-29 MED ORDER — LIDOCAINE-EPINEPHRINE 1 %-1:100000 IJ SOLN
INTRAMUSCULAR | Status: DC | PRN
  Administered 2020-08-29: 1.5 mL

## 2020-08-29 MED ORDER — HYDROMORPHONE HCL 1 MG/ML IJ SOLN: 0.5000 mg | INTRAMUSCULAR | Status: DC | PRN

## 2020-08-29 MED ORDER — DEXAMETHASONE SODIUM PHOSPHATE 4 MG/ML IJ SOLN
INTRAMUSCULAR | Status: DC | PRN
  Administered 2020-08-29: 10 mg via INTRAVENOUS

## 2020-08-29 MED ORDER — CHLORHEXIDINE GLUCONATE 0.12 % MT SOLN
OROMUCOSAL | Status: AC
  Administered 2020-08-29: 15 mL via OROMUCOSAL
  Filled 2020-08-29: qty 15

## 2020-08-29 MED ORDER — ACETAMINOPHEN 650 MG RE SUPP: 650.0000 mg | RECTAL | Status: DC | PRN

## 2020-08-29 MED ORDER — HYDROMORPHONE HCL 1 MG/ML IJ SOLN
INTRAMUSCULAR | Status: AC
  Filled 2020-08-29: qty 1

## 2020-08-29 MED ORDER — PROPOFOL 10 MG/ML IV BOLUS
INTRAVENOUS | Status: DC | PRN
  Administered 2020-08-29: 100 mg via INTRAVENOUS

## 2020-08-29 MED ORDER — THROMBIN 5000 UNITS EX SOLR: OROMUCOSAL | Status: DC | PRN

## 2020-08-29 MED ORDER — ONDANSETRON HCL 4 MG/2ML IJ SOLN
4.0000 mg | INTRAMUSCULAR | Status: DC | PRN
  Administered 2020-08-30 – 2020-09-05 (×6): 4 mg via INTRAVENOUS
  Filled 2020-08-29 (×6): qty 2

## 2020-08-29 MED ORDER — BUPIVACAINE HCL (PF) 0.5 % IJ SOLN
INTRAMUSCULAR | Status: DC | PRN
  Administered 2020-08-29: 1.5 mL

## 2020-08-29 MED ORDER — LABETALOL HCL 5 MG/ML IV SOLN: 10.0000 mg | INTRAVENOUS | Status: DC | PRN

## 2020-08-29 MED ORDER — CEFAZOLIN SODIUM-DEXTROSE 2-3 GM-%(50ML) IV SOLR
INTRAVENOUS | Status: DC | PRN
  Administered 2020-08-29: 2 g via INTRAVENOUS

## 2020-08-29 MED ORDER — CEFAZOLIN SODIUM-DEXTROSE 2-4 GM/100ML-% IV SOLN
2.0000 g | Freq: Three times a day (TID) | INTRAVENOUS | Status: AC
  Administered 2020-08-29 – 2020-08-30 (×2): 2 g via INTRAVENOUS
  Filled 2020-08-29 (×2): qty 100

## 2020-08-29 MED ORDER — THROMBIN 5000 UNITS EX SOLR
CUTANEOUS | Status: AC
  Filled 2020-08-29: qty 5000

## 2020-08-29 MED ORDER — ACETAMINOPHEN 325 MG PO TABS
650.0000 mg | ORAL_TABLET | ORAL | Status: DC | PRN
  Filled 2020-08-29: qty 2

## 2020-08-29 MED ORDER — SUCCINYLCHOLINE CHLORIDE 20 MG/ML IJ SOLN
INTRAMUSCULAR | Status: DC | PRN
  Administered 2020-08-29: 100 mg via INTRAVENOUS

## 2020-08-29 MED ORDER — LACTATED RINGERS IV SOLN: INTRAVENOUS | Status: DC

## 2020-08-29 MED ORDER — BUPIVACAINE HCL (PF) 0.5 % IJ SOLN
INTRAMUSCULAR | Status: AC
  Filled 2020-08-29: qty 30

## 2020-08-29 MED ORDER — SODIUM CHLORIDE 0.9 % IV SOLN: INTRAVENOUS | Status: DC

## 2020-08-29 MED ORDER — ONDANSETRON HCL 4 MG/2ML IJ SOLN
INTRAMUSCULAR | Status: DC | PRN
  Administered 2020-08-29: 4 mg via INTRAVENOUS

## 2020-08-29 SURGICAL SUPPLY — 77 items
ADH SKN CLS APL DERMABOND .7 (GAUZE/BANDAGES/DRESSINGS) ×1
APL PRP STRL LF DISP 70% ISPRP (MISCELLANEOUS) ×1
BLADE CLIPPER SURG (BLADE) ×4 IMPLANT
BLADE SURG 11 STRL SS (BLADE) ×4 IMPLANT
BOOT SUTURE AID YELLOW STND (SUTURE) IMPLANT
BUR PRECISION FLUTE 5.0 (BURR) ×2 IMPLANT
CANISTER SUCT 3000ML PPV (MISCELLANEOUS) ×2 IMPLANT
CARTRIDGE OIL MAESTRO DRILL (MISCELLANEOUS) ×1 IMPLANT
CATH STRATA NSC SNAP SHUNT (Shunt) ×1 IMPLANT
CATH VENTRICULAR 7CM (Shunt) ×1 IMPLANT
CHLORAPREP W/TINT 26 (MISCELLANEOUS) ×2 IMPLANT
CLIP RANEY DISP (INSTRUMENTS) IMPLANT
COVER SURGICAL LIGHT HANDLE (MISCELLANEOUS) ×2 IMPLANT
COVER WAND RF STERILE (DRAPES) ×4 IMPLANT
DECANTER SPIKE VIAL GLASS SM (MISCELLANEOUS) ×2 IMPLANT
DERMABOND ADVANCED (GAUZE/BANDAGES/DRESSINGS) ×1
DERMABOND ADVANCED .7 DNX12 (GAUZE/BANDAGES/DRESSINGS) IMPLANT
DIFFUSER DRILL AIR PNEUMATIC (MISCELLANEOUS) ×2 IMPLANT
DRAPE HALF SHEET 40X57 (DRAPES) ×2 IMPLANT
DRAPE INCISE IOBAN 66X45 STRL (DRAPES) ×2 IMPLANT
DRAPE ORTHO SPLIT 77X108 STRL (DRAPES) ×4
DRAPE SURG ORHT 6 SPLT 77X108 (DRAPES) ×2 IMPLANT
DRSG OPSITE POSTOP 3X4 (GAUZE/BANDAGES/DRESSINGS) ×2 IMPLANT
DURAPREP 26ML APPLICATOR (WOUND CARE) ×4 IMPLANT
ELECT REM PT RETURN 9FT ADLT (ELECTROSURGICAL) ×4
ELECTRODE REM PT RTRN 9FT ADLT (ELECTROSURGICAL) ×2 IMPLANT
GAUZE 4X4 16PLY RFD (DISPOSABLE) IMPLANT
GLOVE BIO SURGEON STRL SZ7.5 (GLOVE) IMPLANT
GLOVE BIOGEL PI IND STRL 6 (GLOVE) ×1 IMPLANT
GLOVE BIOGEL PI IND STRL 7.5 (GLOVE) ×2 IMPLANT
GLOVE BIOGEL PI INDICATOR 6 (GLOVE) ×1
GLOVE BIOGEL PI INDICATOR 7.5 (GLOVE) ×2
GLOVE BIOGEL PI MICRO 5.5 (GLOVE) ×1
GLOVE BIOGEL PI MICRO STRL 5.5 (GLOVE) ×1 IMPLANT
GLOVE ECLIPSE 7.0 STRL STRAW (GLOVE) ×4 IMPLANT
GLOVE EXAM NITRILE XL STR (GLOVE) IMPLANT
GOWN STRL REUS W/ TWL LRG LVL3 (GOWN DISPOSABLE) ×4 IMPLANT
GOWN STRL REUS W/ TWL XL LVL3 (GOWN DISPOSABLE) IMPLANT
GOWN STRL REUS W/TWL 2XL LVL3 (GOWN DISPOSABLE) IMPLANT
GOWN STRL REUS W/TWL LRG LVL3 (GOWN DISPOSABLE) ×8
GOWN STRL REUS W/TWL XL LVL3 (GOWN DISPOSABLE)
HEMOSTAT SURGICEL 2X14 (HEMOSTASIS) IMPLANT
KIT BASIN OR (CUSTOM PROCEDURE TRAY) ×4 IMPLANT
KIT TURNOVER KIT B (KITS) ×4 IMPLANT
MARKER SKIN DUAL TIP RULER LAB (MISCELLANEOUS) ×4 IMPLANT
NDL HYPO 25X1 1.5 SAFETY (NEEDLE) ×1 IMPLANT
NEEDLE HYPO 25X1 1.5 SAFETY (NEEDLE) ×2 IMPLANT
NS IRRIG 1000ML POUR BTL (IV SOLUTION) ×4 IMPLANT
OIL CARTRIDGE MAESTRO DRILL (MISCELLANEOUS) ×2
PACK LAMINECTOMY NEURO (CUSTOM PROCEDURE TRAY) ×2 IMPLANT
PAD ARMBOARD 7.5X6 YLW CONV (MISCELLANEOUS) ×10 IMPLANT
PASSER CATH 65CM DISP (NEUROSURGERY SUPPLIES) ×2 IMPLANT
SCISSORS LAP 5X35 DISP (ENDOMECHANICALS) IMPLANT
SET IRRIG TUBING LAPAROSCOPIC (IRRIGATION / IRRIGATOR) IMPLANT
SET TUBE SMOKE EVAC HIGH FLOW (TUBING) ×2 IMPLANT
SHEATH PERITONEAL INTRO 61 (SHEATH) ×2 IMPLANT
SLEEVE ENDOPATH XCEL 5M (ENDOMECHANICALS) ×2 IMPLANT
SPONGE LAP 4X18 RFD (DISPOSABLE) IMPLANT
SPONGE SURGIFOAM ABS GEL SZ50 (HEMOSTASIS) ×2 IMPLANT
STAPLER SKIN PROX WIDE 3.9 (STAPLE) ×2 IMPLANT
STRIP CLOSURE SKIN 1/2X4 (GAUZE/BANDAGES/DRESSINGS) IMPLANT
SUT ETHILON 3 0 PS 1 (SUTURE) IMPLANT
SUT MNCRL AB 4-0 PS2 18 (SUTURE) ×2 IMPLANT
SUT NURALON 4 0 TR CR/8 (SUTURE) IMPLANT
SUT SILK 0 TIES 10X30 (SUTURE) ×2 IMPLANT
SUT SILK 3 0 SH 30 (SUTURE) IMPLANT
SUT VIC AB 3-0 SH 27 (SUTURE) ×2
SUT VIC AB 3-0 SH 27X BRD (SUTURE) ×1 IMPLANT
SUT VIC AB 3-0 SH 8-18 (SUTURE) ×5 IMPLANT
TOWEL GREEN STERILE (TOWEL DISPOSABLE) ×6 IMPLANT
TOWEL GREEN STERILE FF (TOWEL DISPOSABLE) ×4 IMPLANT
TRAY LAPAROSCOPIC MC (CUSTOM PROCEDURE TRAY) ×2 IMPLANT
TROCAR XCEL BLUNT TIP 100MML (ENDOMECHANICALS) IMPLANT
TROCAR XCEL NON-BLD 5MMX100MML (ENDOMECHANICALS) ×2 IMPLANT
TUBE CONNECTING 12X1/4 (SUCTIONS) ×2 IMPLANT
UNDERPAD 30X36 HEAVY ABSORB (UNDERPADS AND DIAPERS) ×2 IMPLANT
WATER STERILE IRR 1000ML POUR (IV SOLUTION) ×2 IMPLANT

## 2020-08-29 NOTE — Anesthesia Postprocedure Evaluation (Signed)
Anesthesia Post Note  Patient: Jose Ramirez  Procedure(s) Performed: SHUNT INSERTION VENTRICULAR-PERITONEAL (N/A ) LAPAROSCOPIC REVISION VENTRICULAR-PERITONEAL (V-P) SHUNT (N/A )     Patient location during evaluation: PACU Anesthesia Type: General Level of consciousness: awake and alert Pain management: pain level controlled Vital Signs Assessment: post-procedure vital signs reviewed and stable Respiratory status: spontaneous breathing, nonlabored ventilation, respiratory function stable and patient connected to nasal cannula oxygen Cardiovascular status: blood pressure returned to baseline and stable Postop Assessment: no apparent nausea or vomiting Anesthetic complications: no   No complications documented.  Last Vitals:  Vitals:   08/29/20 1315 08/29/20 1330  BP: 136/74 132/77  Pulse: 73 77  Resp: 10 13  Temp:    SpO2: 97% 93%    Last Pain:  Vitals:   08/29/20 1315  TempSrc:   PainSc: 8                  Taia Bramlett,W. EDMOND

## 2020-08-29 NOTE — Progress Notes (Signed)
Pt has been CHG'd twice last night. 0600 heparin held. Has been npo since MN x sips with metoprolol. Consent in chart.

## 2020-08-29 NOTE — Op Note (Signed)
  NEUROSURGERY OPERATIVE NOTE   PREOP DIAGNOSIS: Communicating hydrocephalus  POSTOP DIAGNOSIS: Same  PROCEDURE: 1. Laparoscopic Assisted right ventriculoperitoneal shunt placement  SURGEON: Dr. Consuella Lose, MD  CO-SURGEON: Dr. Michaelle Birks  ASSISTANT: Ferne Reus, PA-C  ANESTHESIA: General Endotracheal  EBL: 50cc  SPECIMENS: None  DRAINS: None  COMPLICATIONS: None immediate  CONDITION: Hemodynamically stable to PACU  SHUNT PLACED: Medtronic Strata II, set to 1.5  HISTORY: Jose Ramirez is a 70 y.o. 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 He was doing reasonably well postoperatively but began to notice progressively worsening headache, nausea, vomiting, and gait instability. 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 There was also enlargement of the supratentorial ventricular system indicating communicating hydrocephalus. Placement of a ventriculoperitoneal shunt was therefore indicated. The risks, benefits, and expected postoperative course were all reviewed in detail with the patient. After all his questions were answered informed consent was obtained.  PROCEDURE IN DETAIL: The patient was brought to the operating room and transferred to the operative table. After induction of general anesthesia, the patient was positioned on the operative table in the supine position with all pressure points meticulously padded. The skin of the scalp,  neck, chest, and abdomen were prepped in the usual sterile fashion.  After timeout was conducted, semilunar skin incision over Kocher's point on the right was infiltrated with local anesthetic with epinephrine. Incision was then made sharply and carried down through the galea. Small skin flap was  elevated. A tunnel was then created using a hemostat subcutaneously to the retroauricular region.  Counter incision was made behind the ear, and the distal shunt apparatus was then passed and the valve was seated subcutaneously.  The shunt passer was then used to tunnel from the retroauricular incision into the right upper quadrant.   At this point, a bur hole was created over Kocher's point on the right. The dura was coagulated. Using standard anatomic landmarks a 7 cm ventricular catheter was then placed.  Good clear CSF flow was obtained. The catheter was then connected to the valve apparatus. While we placed the proximal catheter, the distal portion of the catheter was placed into the peritoneal cavity within the pelvis by Dr. Zenia Resides. Details of this are dictated separately. The valve was then pumped, and good distal flow was observed with the catheter within the pelvis.   At this point the scalp wounds were irrigated with copious irrigation, and closed using 3-0 Vicryl stitches.  The skin was then closed using standard surgical skin staples. Abdominal incisions well subsequently just, details again dictated separately. Sterile dressings were then applied.  At the end of the case all sponge needle and instrument counts were correct.  The patient tolerated the procedure well and was extubated in the room and taken to the postanesthesia care unit in stable condition.   Consuella Lose, MD [facility]

## 2020-08-29 NOTE — Progress Notes (Signed)
  NEUROSURGERY PROGRESS NOTE   No issues overnight. Pt without complaints this am.  EXAM:  BP 99/74   Pulse 77   Temp 97.9 F (36.6 C) (Oral)   Resp (!) 8   Ht 5\' 8"  (1.727 m)   Wt 74.1 kg   SpO2 91%   BMI 24.84 kg/m   Awake, alert, oriented  Speech fluent, appropriate  CN grossly intact  5/5 BUE/BLE   IMPRESSION:  70 y.o. male s/p resection cerebellar hemangioblastoma with delayed onset HCP.  PLAN: - Will proceed with lap-assisted VP shunt today  I have reviewed the indication for surgery, associated risks, and expected postoperative course and recovery with the patient in detail. All his questions today were answered and he provided consent to proceed.   Consuella Lose, MD [facility]

## 2020-08-29 NOTE — Transfer of Care (Signed)
Immediate Anesthesia Transfer of Care Note  Patient: Jose Ramirez  Procedure(s) Performed: SHUNT INSERTION VENTRICULAR-PERITONEAL (N/A ) LAPAROSCOPIC REVISION VENTRICULAR-PERITONEAL (V-P) SHUNT (N/A )  Patient Location: PACU  Anesthesia Type:General  Level of Consciousness: awake, alert  and patient cooperative  Airway & Oxygen Therapy: Patient Spontanous Breathing  Post-op Assessment: Report given to RN and Post -op Vital signs reviewed and stable  Post vital signs: Reviewed and stable  Last Vitals:  Vitals Value Taken Time  BP    Temp    Pulse 77 08/29/20 1246  Resp 19 08/29/20 1246  SpO2 91 % 08/29/20 1246  Vitals shown include unvalidated device data.  Last Pain:  Vitals:   08/29/20 1059  TempSrc:   PainSc: 0-No pain         Complications: No complications documented.

## 2020-08-29 NOTE — Anesthesia Preprocedure Evaluation (Addendum)
Anesthesia Evaluation  Patient identified by MRN, date of birth, ID band Patient awake    Reviewed: Allergy & Precautions, H&P , NPO status , Patient's Chart, lab work & pertinent test results  Airway Mallampati: II  TM Distance: >3 FB Neck ROM: Full    Dental no notable dental hx.    Pulmonary neg pulmonary ROS,    Pulmonary exam normal        Cardiovascular hypertension, Pt. on medications and Pt. on home beta blockers Normal cardiovascular exam     Neuro/Psych negative neurological ROS  negative psych ROS   GI/Hepatic Neg liver ROS, GERD  Medicated,  Endo/Other  negative endocrine ROS  Renal/GU negative Renal ROS  negative genitourinary   Musculoskeletal   Abdominal   Peds  Hematology negative hematology ROS (+)   Anesthesia Other Findings   Reproductive/Obstetrics negative OB ROS                            Anesthesia Physical Anesthesia Plan  ASA: III  Anesthesia Plan: General   Post-op Pain Management:    Induction: Intravenous  PONV Risk Score and Plan: 3 and Ondansetron, Dexamethasone and Midazolam  Airway Management Planned: Oral ETT  Additional Equipment:   Intra-op Plan:   Post-operative Plan: Extubation in OR  Informed Consent: I have reviewed the patients History and Physical, chart, labs and discussed the procedure including the risks, benefits and alternatives for the proposed anesthesia with the patient or authorized representative who has indicated his/her understanding and acceptance.     Dental advisory given  Plan Discussed with: CRNA  Anesthesia Plan Comments:         Anesthesia Quick Evaluation

## 2020-08-29 NOTE — Op Note (Signed)
Date: 08/29/20  Patient: Jose Ramirez MRN: [id number]  Preoperative Diagnosis: Hydrocephalus Postoperative Diagnosis: Same  Procedure: Laparoscopic-assisted ventriculoperitoneal shunt placement  Surgeon: Michaelle Birks, MD Co-Surgeon: Consuella Lose, MD  EBL: Minimal  Anesthesia: General  Specimens: None  Indications: [name] is a 70 yo male with hydrocephalus following resection of a cerebellar hemangioblastoma. General surgery was consulted for laparoscopic assistance with VP shunt placement.  Findings: VP shunt catheter tip placed in the pelvis, flow of CSF through the tip was confirmed laparoscopically.  Procedure details: Informed consent was obtained in the preoperative area prior to the procedure. The patient was brought to the operating room and placed on the table in the supine position. General anesthesia was induced and appropriate lines and drains were placed for intraoperative monitoring. Perioperative antibiotics were administered per SCIP guidelines. The patient was prepped and draped in the usual sterile fashion. A pre-procedure timeout was taken verifying patient identity, surgical site and procedure to be performed.   A small infraumbilical skin incision was made and the umbilical stalk was grasped and elevated. A veress needle was inserted through the fascia and intraperitoneal placement was confirmed with the saline drop test. The abdomen was insufflated and a 28mm port was placed. The peritoneal cavity was inspected with no evidence of visceral or vascular injury. An additional 25mm port was placed in the RLQ under direct visualization. The shunt catheter was tunneled down the right chest wall to the RUQ abdominal wall by Dr. Kathyrn Sheriff. The catheter tip was then inserted into the peritoneum under direct visualization via a small RUQ incision. The catheter tip was then placed into the pelvis, and CSF was visualized flowing freely from the catheter. The ports were removed  and the abdomen was desufflated. The skin at all port sites was closed with 4-0 monocryl subcuticular suture. Dermabond was applied.   The patient tolerated the procedure well with no apparent complications. All counts were correct x2 at the end of the procedure. The patient was extubated and taken to PACU in stable condition.  Michaelle Birks, MD 08/29/20 12:37 PM

## 2020-08-29 NOTE — Anesthesia Procedure Notes (Signed)
Procedure Name: Intubation Date/Time: 08/29/2020 11:33 AM Performed by: Babs Bertin, CRNA Pre-anesthesia Checklist: Patient identified, Emergency Drugs available, Suction available and Patient being monitored Patient Re-evaluated:Patient Re-evaluated prior to induction Oxygen Delivery Method: Circle System Utilized Preoxygenation: Pre-oxygenation with 100% oxygen Induction Type: IV induction Ventilation: Two handed mask ventilation required, Oral airway inserted - appropriate to patient size and Mask ventilation with difficulty Laryngoscope Size: Mac and 3 Grade View: Grade I Tube type: Oral Tube size: 7.5 mm Number of attempts: 1 Airway Equipment and Method: Stylet and Oral airway Placement Confirmation: ETT inserted through vocal cords under direct vision,  positive ETCO2 and breath sounds checked- equal and bilateral Secured at: 22 cm Tube secured with: Tape Dental Injury: Teeth and Oropharynx as per pre-operative assessment

## 2020-08-29 NOTE — Progress Notes (Signed)
PT Cancellation Note  Patient Details Name: Jose Ramirez MRN: [id number] DOB: 08/17/1951   Cancelled Treatment:    Reason Eval/Treat Not Completed: Patient at procedure or test/unavailable.  Pt is surgery for ?PEG and VPS.  Will check back this pm as able to see if pt appropriate to be seen.  08/29/2020  [name], PT Acute Rehabilitation Services [phone]  (pager) [phone]  (office)  [name] [name] 08/29/2020, 11:27 AM

## 2020-08-30 ENCOUNTER — Encounter (HOSPITAL_COMMUNITY): Payer: Self-pay | Admitting: Neurosurgery

## 2020-08-30 LAB — URINALYSIS, ROUTINE W REFLEX MICROSCOPIC
Bilirubin Urine: NEGATIVE
Glucose, UA: NEGATIVE mg/dL
Hgb urine dipstick: NEGATIVE
Ketones, ur: NEGATIVE mg/dL
Leukocytes,Ua: NEGATIVE
Nitrite: NEGATIVE
Protein, ur: NEGATIVE mg/dL
Specific Gravity, Urine: 1.008 (ref 1.005–1.030)
pH: 5 (ref 5.0–8.0)

## 2020-08-30 LAB — CBC
HCT: 44 % (ref 39.0–52.0)
Hemoglobin: 15.3 g/dL (ref 13.0–17.0)
MCH: 31.6 pg (ref 26.0–34.0)
MCHC: 34.8 g/dL (ref 30.0–36.0)
MCV: 90.9 fL (ref 80.0–100.0)
Platelets: 395 10*3/uL (ref 150–400)
RBC: 4.84 MIL/uL (ref 4.22–5.81)
RDW: 13.2 % (ref 11.5–15.5)
WBC: 15.5 10*3/uL — ABNORMAL HIGH (ref 4.0–10.5)
nRBC: 0 % (ref 0.0–0.2)

## 2020-08-30 LAB — BASIC METABOLIC PANEL
Anion gap: 12 (ref 5–15)
BUN: 15 mg/dL (ref 8–23)
CO2: 28 mmol/L (ref 22–32)
Calcium: 9.3 mg/dL (ref 8.9–10.3)
Chloride: 98 mmol/L (ref 98–111)
Creatinine, Ser: 0.76 mg/dL (ref 0.61–1.24)
GFR, Estimated: >60 mL/min (ref >60)
Glucose, Bld: 179 mg/dL — ABNORMAL HIGH (ref 70–99)
Potassium: 4 mmol/L (ref 3.5–5.1)
Sodium: 138 mmol/L (ref 135–145)

## 2020-08-30 MED ORDER — NITROFURANTOIN MONOHYD MACRO 100 MG PO CAPS
100.0000 mg | ORAL_CAPSULE | Freq: Two times a day (BID) | ORAL | Status: AC
  Administered 2020-08-30 – 2020-09-03 (×10): 100 mg via ORAL
  Filled 2020-08-30 (×11): qty 1

## 2020-08-30 NOTE — Progress Notes (Signed)
Pt transferred from 4N ICU to 4NP3. VS stable. Pt resting comfortably. Bed in lowest position and call bell in reach.   Pt belongings at the bedside include:  Rollator walker  converse tennis shoes Black leather bag with shirts, pants, socks and underwear Toothbrushes  Black leather wallet containing $175, 1 $100, 3 $20, 1 $10, AND 5 $1, sent to security to be locked up per Pt and Pt's visitor request.

## 2020-08-30 NOTE — Progress Notes (Deleted)
Physical Therapy Treatment Patient Details Name: Jose Ramirez MRN: [id number] DOB: 23-May-1951 Today's Date: 08/30/2020    History of Present Illness The pt is a 70 yo male presenting 2.5 months s/p suboccipital craniectomy for resection of cerebellar tumor with delayed hydrocephalus. 11/13  S/P VP shunt.  PMH includes: HTN and GERD.    PT Comments    Pt showing progress toward goals post VPS.  Worked on visual compensation/stabilization during transitions and gait.  Pt forgot to follow the compensation techniques at times and by end of session was nauseous with vomiting.    Follow Up Recommendations  SNF     Equipment Recommendations  Other (comment) (TBD next venue)    Recommendations for Other Services       Precautions / Restrictions Precautions Precautions: Fall Precaution Comments: pt reports falling 1x/month at home    Mobility  Bed Mobility Overal bed mobility: Needs Assistance Bed Mobility: Supine to Sit     Supine to sit: Supervision     General bed mobility comments: cues for visual compensatory strategy  Transfers Overall transfer level: Needs assistance   Transfers: Sit to/from Stand Sit to Stand: Min guard         General transfer comment: cuing for safety, compensations and guard assist  Ambulation/Gait Ambulation/Gait assistance: Min guard Gait Distance (Feet): 50 Feet (x2) Assistive device: 4-wheeled walker Gait Pattern/deviations: Step-through pattern;Decreased step length - right;Decreased step length - left (mild ataxia R>L) Gait velocity: decreased Gait velocity interpretation: <1.8 ft/sec, indicate of risk for recurrent falls General Gait Details: tentative steps, mild "waggle" of rollator.  Cues for compensation, improved use of the rollator, safety issues with the brakes.   Stairs             Wheelchair Mobility    Modified Rankin (Stroke Patients Only)       Balance Overall balance assessment: Needs  assistance Sitting-balance support: No upper extremity supported Sitting balance-Leahy Scale: Fair Sitting balance - Comments: no   Standing balance support: Single extremity supported;Bilateral upper extremity supported Standing balance-Leahy Scale: Poor Standing balance comment: reliant on BUE support                            Cognition Arousal/Alertness: Awake/alert Behavior During Therapy: WFL for tasks assessed/performed Overall Cognitive Status: Within Functional Limits for tasks assessed (NT formally)                                        Exercises      General Comments General comments (skin integrity, edema, etc.): vss on RA      Pertinent Vitals/Pain Pain Assessment: No/denies pain Pain Intervention(s): Monitored during session;Other (comment) (nauseated)    Home Living Family/patient expects to be discharged to:: Skilled nursing facility Living Arrangements: Alone             Additional Comments: pt from home alone where his older brothers check on him "sometimes" and provide all groceries and errands. the pt states they check on him, but "not enough" and he has been "lucky" a few times that they happened to come by when they did as he had fallen with no way of contacting for help and he could have been down for a while if it wasnt for his brothers stopping by.    Prior Function Level of Independence: Independent with assistive device(s)  Comments: pt reports independence with mobility and ADLs, relies on his brothers for errands and food, but reports they dont come often. pt also reports multiple falls at home, and that he uses rollator for mobility in the home   PT Goals (current goals can now be found in the care plan section) Acute Rehab PT Goals Patient Stated Goal: to get back to living life PT Goal Formulation: With patient Time For Goal Achievement: 09/09/20 Potential to Achieve Goals: Good    Frequency    Min  3X/week      PT Plan      Co-evaluation              AM-PAC PT "6 Clicks" Mobility   Outcome Measure  Help needed turning from your back to your side while in a flat bed without using bedrails?: A Little Help needed moving from lying on your back to sitting on the side of a flat bed without using bedrails?: A Little Help needed moving to and from a bed to a chair (including a wheelchair)?: A Little Help needed standing up from a chair using your arms (e.g., wheelchair or bedside chair)?: A Little Help needed to walk in hospital room?: A Little Help needed climbing 3-5 steps with a railing? : A Lot 6 Click Score: 17    End of Session   Activity Tolerance: Patient tolerated treatment well Patient left: in chair;with call bell/phone within reach Nurse Communication: Mobility status PT Visit Diagnosis: Unsteadiness on feet (R26.81);Other abnormalities of gait and mobility (R26.89);Muscle weakness (generalized) (M62.81)     Time: 7591-6384 PT Time Calculation (min) (ACUTE ONLY): 30 min  Charges:  $Gait Training: 8-22 mins                     08/30/2020  [name], PT Acute Rehabilitation Services [phone]  (pager) [phone]  (office)   [name] Maizie Garno 08/30/2020, 1:14 PM

## 2020-08-30 NOTE — Progress Notes (Addendum)
[facility] Progress Note  1 Day Post-Op  Subjective: CC-  Feeling ok this morning. Head and abdomen sore but denies significant pain. He did vomit twice last night. Tolerating liquids but has not had solid food since surgery. Passing flatus, no BM.  Objective: Vital signs in last 24 hours: Temp:  [97.1 F (36.2 C)-98.1 F (36.7 C)] 97.9 F (36.6 C) (01/14 0400) Pulse Rate:  [70-97] 87 (01/14 0600) Resp:  [5-18] 14 (01/14 0600) BP: (99-150)/(72-92) 109/79 (01/14 0300) SpO2:  [91 %-97 %] 93 % (01/14 0600) Weight:  [79.4 kg] 79.4 kg (01/13 1059) Last BM Date: 08/27/20  Intake/Output from previous day: 01/13 0701 - 01/14 0700 In: 2400.4 [P.O.:360; I.V.:1990.4; IV Piggyback:50] Out: 1670 [Urine:1650; Blood:20] Intake/Output this shift: No intake/output data recorded.  PE: Gen:  Alert, NAD, pleasant HEENT: EOM's intact, pupils equal and round, scalp incision with honeycomb dressing in place Pulm:  rate and effort normal Abd: Soft, ND, appropriate mild tenderness over incision, +BS, lap incisions C/D/I without erythema or drainage  Lab Results:  Recent Labs    08/28/20 1659 08/30/20 0340  WBC 11.3* 15.5*  HGB 15.4 15.3  HCT 44.3 44.0  PLT 342 395   BMET Recent Labs    08/28/20 1659 08/30/20 0340  NA 134* 138  K 4.2 4.0  CL 95* 98  CO2 27 28  GLUCOSE 152* 179*  BUN 9 15  CREATININE 0.62 0.76  CALCIUM 9.1 9.3   PT/INR No results for input(s): LABPROT, INR in the last 72 hours. CMP     Component Value Date/Time   NA 138 08/30/2020 0340   K 4.0 08/30/2020 0340   CL 98 08/30/2020 0340   CO2 28 08/30/2020 0340   GLUCOSE 179 (H) 08/30/2020 0340   BUN 15 08/30/2020 0340   CREATININE 0.76 08/30/2020 0340   CALCIUM 9.3 08/30/2020 0340   PROT 6.2 (L) 08/23/2020 2250   ALBUMIN 3.0 (L) 08/23/2020 2250   AST 34 08/23/2020 2250   ALT 50 (H) 08/23/2020 2250   ALKPHOS 107 08/23/2020 2250   BILITOT 0.7 08/23/2020 2250   GFRNONAA >60 08/30/2020 0340    Lipase  No results found for: LIPASE     Studies/Results: No results found.  Anti-infectives: Anti-infectives (From admission, onward)   Start     Dose/Rate Route Frequency Ordered Stop   08/30/20 1000  nitrofurantoin (macrocrystal-monohydrate) (MACROBID) capsule 100 mg        100 mg Oral Every 12 hours 08/30/20 0722 09/04/20 0959   08/29/20 1930  ceFAZolin (ANCEF) IVPB 2g/100 mL premix        2 g 200 mL/hr over 30 Minutes Intravenous Every 8 hours 08/29/20 1404 08/30/20 0451   08/29/20 1000  ceFAZolin (ANCEF) IVPB 2g/100 mL premix  Status:  Discontinued        2 g 200 mL/hr over 30 Minutes Intravenous 30 min pre-op 08/28/20 1616 08/29/20 1351   08/23/20 2200  nitrofurantoin (macrocrystal-monohydrate) (MACROBID) capsule 100 mg  Status:  Discontinued        100 mg Oral 2 times daily 08/23/20 1800 08/24/20 1404       Assessment/Plan HTN HLD GERD 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 Kathyrn Sheriff S/p Laparoscopic-assisted ventriculoperitoneal shunt placement 1/13 Dr. Zenia Resides - POD#1 - Incisions cdi, do not need any wound care. Ok to shower. PT/OT. Diet as tolerated. We will sign off, please  call with concerns.   ID - ancef periop, macrobid 1/14>> VTE - SCDs, ok for chemical DVT prophylaxis from gen surg perspective FEN - reg diet, Ensure Foley - none Follow up - NSGY   LOS: 7 days    Wellington Hampshire, [facility] 08/30/2020, 8:19 AM Please see Amion for pager number during day hours 7:00am-4:30pm

## 2020-08-30 NOTE — Progress Notes (Signed)
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 11/13  S/P VP shunt.  PMH includes: HTN and GERD.   OT comments  Pt s/p VP shunt 08/29/20 with balance deficits affecting all adls. Pt continues to progress toward goals but requires mod cues throughout session for gaze stabilization. Recommendation for CIR.    Follow Up Recommendations  CIR;Supervision/Assistance - 24 hour    Equipment Recommendations  3 in 1 bedside commode;Other (comment) (RW- has rollator and its too fast moving for patient at this time)    Recommendations for Other Services Rehab consult    Precautions / Restrictions Precautions Precautions: Fall Precaution Comments: pt reports falling 1x/month at home       Mobility Bed Mobility Overal bed mobility: Needs Assistance Bed Mobility: Supine to Sit     Supine to sit: Supervision     General bed mobility comments: cues for visual compensatory strategy  Transfers Overall transfer level: Needs assistance   Transfers: Sit to/from Stand Sit to Stand: Min guard         General transfer comment: cuing for safety, compensations and guard assist    Balance Overall balance assessment: Needs assistance Sitting-balance support: No upper extremity supported Sitting balance-Leahy Scale: Fair Sitting balance - Comments: no   Standing balance support: Single extremity supported;Bilateral upper extremity supported Standing balance-Leahy Scale: Poor Standing balance comment: reliant on BUE support                           ADL either performed or assessed with clinical judgement   ADL Overall ADL's : Needs assistance/impaired Eating/Feeding: Set up;Sitting   Grooming: Minimal assistance;Sitting Grooming  Details (indicate cue type and reason): tremor to BIL UE with wiping face             Lower Body Dressing: Minimal assistance;Bed level Lower Body Dressing Details (indicate cue type and reason): able to don socks. pt did not like socks so given bigger pair and don Toilet Transfer: RW;Min Psychiatric nurse Details (indicate cue type and reason): mod cues for gaze stabilization         Functional mobility during ADLs: Min guard;Rolling walker General ADL Comments: pt needs mod cues throughout session to correctly gaze stabilization     Vision   Additional Comments: educated on gaze stabilization   Perception     Praxis      Cognition Arousal/Alertness: Awake/alert Behavior During Therapy: WFL for tasks assessed/performed Overall Cognitive Status: Impaired/Different from baseline (NT formally)                                 General Comments: poor recall of information regarding gaze stabilization. pt needs cues throughout session to avoid. pt states "oh i didnt know all this was out here, oh i aint suppose to be doing this"        Exercises Other Exercises Other Exercises: gaze stabilization   Shoulder Instructions       General Comments VSS, vomiting at end of session after looking downward    Pertinent Vitals/ Pain       Pain Assessment: No/denies pain Pain Intervention(s): Monitored during session (started to vomit)  Home Living Family/patient expects to  be discharged to:: Skilled nursing facility Living Arrangements: Alone                               Additional Comments: pt from home alone where his older brothers check on him "sometimes" and provide all groceries and errands. the pt states they check on him, but "not enough" and he has been "lucky" a few times that they happened to come by when they did as he had fallen with no way of contacting for help and he could have been down for a while if it wasnt for his brothers  stopping by.      Prior Functioning/Environment Level of Independence: Independent with assistive device(s)        Comments: pt reports independence with mobility and ADLs, relies on his brothers for errands and food, but reports they dont come often. pt also reports multiple falls at home, and that he uses rollator for mobility in the home   Frequency  Min 2X/week        Progress Toward Goals  OT Goals(current goals can now be found in the care plan section)  Progress towards OT goals: Progressing toward goals  Acute Rehab OT Goals Patient Stated Goal: to get back to living life OT Goal Formulation: With patient Time For Goal Achievement: 09/10/20 Potential to Achieve Goals: Good ADL Goals Pt Will Perform Grooming: with modified independence;sitting Pt Will Perform Upper Body Bathing: with modified independence;sitting Pt Will Perform Lower Body Bathing: with min guard assist;sit to/from stand;sitting/lateral leans Pt Will Perform Upper Body Dressing: with set-up;with supervision;sitting Pt Will Perform Lower Body Dressing: with min guard assist;sit to/from stand;sitting/lateral leans Pt Will Transfer to Toilet: ambulating;with min guard assist;bedside commode Pt/caregiver will Perform Home Exercise Program: With written HEP provided;Both right and left upper extremity;With theraband;Increased strength;With Supervision Additional ADL Goal #1: Pt will complete habituation exercises for central vertigo with min VC  Plan Discharge plan remains appropriate    Co-evaluation    PT/OT/SLP Co-Evaluation/Treatment: Yes Reason for Co-Treatment: For patient/therapist safety   OT goals addressed during session: ADL's and self-care      AM-PAC OT "6 Clicks" Daily Activity     Outcome Measure   Help from another person eating meals?: A Little Help from another person taking care of personal grooming?: A Little Help from another person toileting, which includes using toliet,  bedpan, or urinal?: A Little Help from another person bathing (including washing, rinsing, drying)?: A Little Help from another person to put on and taking off regular upper body clothing?: A Little Help from another person to put on and taking off regular lower body clothing?: A Little 6 Click Score: 18    End of Session Equipment Utilized During Treatment: Rolling walker  OT Visit Diagnosis: Unsteadiness on feet (R26.81);Other abnormalities of gait and mobility (R26.89);Muscle weakness (generalized) (M62.81);History of falling (Z91.81);Ataxia, unspecified (R27.0);Other symptoms and signs involving cognitive function;Pain;Dizziness and giddiness (R42)   Activity Tolerance Patient tolerated treatment well   Patient Left in chair;with call bell/phone within reach;with chair alarm set   Nurse Communication Mobility status;Other (comment)        Time: 7616-0737 OT Time Calculation (min): 30 min  Charges: OT General Charges $OT Visit: 1 Visit OT Treatments $Self Care/Home Management : 8-22 mins   Brynn, OTR/L  Acute Rehabilitation Services Pager: [phone] Office: [phone] .    Jeri Modena 08/30/2020, 1:38 PM

## 2020-08-30 NOTE — Progress Notes (Signed)
Inpatient Rehab Admissions Coordinator Note:   Per OT recommendations, pt was screened for CIR candidacy by Shann Medal, PT, DPT.  At this time note pt mobilizing at min guard or better, up to 50'.  Min assist or better for ADLs.  Pt mobilizing too well for CIR at this time.  No consult recommended, and f/u in lower level of care.  Please contact me with questions.   Shann Medal, PT, DPT [phone] 08/30/20 2:28 PM

## 2020-08-30 NOTE — Progress Notes (Signed)
  NEUROSURGERY PROGRESS NOTE   No issues overnight.  Complains of headache but no N/T/W  EXAM:  BP 109/79   Pulse 87   Temp 97.9 F (36.6 C) (Oral)   Resp 14   Ht 5\' 8"  (1.727 m)   Wt 79.4 kg   SpO2 93%   BMI 26.61 kg/m   Awake, alert, oriented  Speech fluent, appropriate  CN grossly intact  5/5 BUE/BLE  Incisions: c/d/i  IMPRESSION/PLAN 70 y.o. male POD1 lap assisted VP shunt. Neurologically stable. - PT/OT today - transfer to step down

## 2020-08-30 NOTE — Evaluation (Signed)
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 11/13  S/P VP shunt.  PMH includes: HTN and GERD.  Clinical Impression  Pt showing progress toward goals post VPS.  Worked on visual compensation/stabilization during transitions and gait.  Pt forgot to follow the compensation techniques at times and by end of session was nauseous with vomiting.    Follow Up Recommendations SNF    Equipment Recommendations  Other (comment) (TBD next venue)    Recommendations for Other Services       Precautions / Restrictions Precautions Precautions: Fall Precaution Comments: pt reports falling 1x/month at home      Mobility  Bed Mobility Overal bed mobility: Needs Assistance Bed Mobility: Supine to Sit     Supine to sit: Supervision     General bed mobility comments: cues for visual compensatory strategy    Transfers Overall transfer level: Needs assistance   Transfers: Sit to/from Stand Sit to Stand: Min guard         General transfer comment: cuing for safety, compensations and guard assist  Ambulation/Gait Ambulation/Gait assistance: Min guard Gait Distance (Feet): 50 Feet (x2) Assistive device: 4-wheeled walker Gait Pattern/deviations: Step-through pattern;Decreased step length - right;Decreased step length - left (mild ataxia R>L) Gait velocity: decreased Gait velocity interpretation: <1.8 ft/sec, indicate of risk for recurrent falls General Gait Details: tentative steps, mild "waggle" of rollator.  Cues for compensation, improved use of the rollator, safety issues with the brakes.  Stairs            Wheelchair Mobility    Modified Rankin (Stroke Patients Only)       Balance Overall balance assessment: Needs  assistance Sitting-balance support: No upper extremity supported Sitting balance-Leahy Scale: Fair Sitting balance - Comments: no   Standing balance support: Single extremity supported;Bilateral upper extremity supported Standing balance-Leahy Scale: Poor Standing balance comment: reliant on BUE support                             Pertinent Vitals/Pain Pain Assessment: No/denies pain Pain Intervention(s): Monitored during session;Other (comment) (nauseated)    Home Living Family/patient expects to be discharged to:: Skilled nursing facility Living Arrangements: Alone               Additional Comments: pt from home alone where his older brothers check on him "sometimes" and provide all groceries and errands. the pt states they check on him, but "not enough" and he has been "lucky" a few times that they happened to come by when they did as he had fallen with no way of contacting for help and he could have been down for a while if it wasnt for his brothers stopping by.    Prior Function Level of Independence: Independent with assistive device(s)         Comments: pt reports independence with mobility and ADLs, relies on his brothers for errands and food, but reports they dont come often. pt also reports multiple falls at home, and that he uses rollator for mobility in the home     Hand Dominance   Dominant Hand: Right    Extremity/Trunk Assessment                Communication   Communication: No  difficulties  Cognition Arousal/Alertness: Awake/alert Behavior During Therapy: WFL for tasks assessed/performed Overall Cognitive Status: Within Functional Limits for tasks assessed (NT formally)                                        General Comments General comments (skin integrity, edema, etc.): vss on RA    Exercises     Assessment/Plan    PT Assessment Patient needs continued PT services  PT Problem List Decreased  strength;Decreased activity tolerance;Decreased balance;Decreased mobility;Decreased coordination;Decreased cognition;Decreased knowledge of use of DME;Decreased safety awareness       PT Treatment Interventions DME instruction;Gait training;Functional mobility training;Therapeutic activities;Balance training;Neuromuscular re-education;Patient/family education    PT Goals (Current goals can be found in the Care Plan section)  Acute Rehab PT Goals Patient Stated Goal: to get back to living life PT Goal Formulation: With patient Time For Goal Achievement: 09/09/20 Potential to Achieve Goals: Good    Frequency Min 3X/week   Barriers to discharge Decreased caregiver support      Co-evaluation               AM-PAC PT "6 Clicks" Mobility  Outcome Measure Help needed turning from your back to your side while in a flat bed without using bedrails?: A Little Help needed moving from lying on your back to sitting on the side of a flat bed without using bedrails?: A Little Help needed moving to and from a bed to a chair (including a wheelchair)?: A Little Help needed standing up from a chair using your arms (e.g., wheelchair or bedside chair)?: A Little Help needed to walk in hospital room?: A Little Help needed climbing 3-5 steps with a railing? : A Lot 6 Click Score: 17    End of Session   Activity Tolerance: Patient tolerated treatment well Patient left: in chair;with call bell/phone within reach Nurse Communication: Mobility status PT Visit Diagnosis: Unsteadiness on feet (R26.81);Other abnormalities of gait and mobility (R26.89);Muscle weakness (generalized) (M62.81)    Time: 7026-3785 PT Time Calculation (min) (ACUTE ONLY): 30 min   Charges:   PT Evaluation $PT Re-evaluation: 1 Re-eval          08/30/2020  [name], PT Acute Rehabilitation Services [phone]  (pager) [phone]  (office)  [name] Danessa Mensch 08/30/2020, 1:17 PM

## 2020-08-31 MED ORDER — PANTOPRAZOLE SODIUM 40 MG PO TBEC
40.0000 mg | DELAYED_RELEASE_TABLET | Freq: Every day | ORAL | Status: DC
  Administered 2020-08-31 – 2020-09-06 (×7): 40 mg via ORAL
  Filled 2020-08-31 (×7): qty 1

## 2020-08-31 NOTE — Progress Notes (Signed)
Patient ID: Jose Ramirez, male   DOB: 03-Jun-1951, 70 y.o.   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

## 2020-08-31 NOTE — TOC Progression Note (Signed)
Transition of Care (TOC) - Progression Note    Patient Details  Name: Deontra Wilmarth MRN: [id number] Date of Birth: 12/12/1950  Transition of Care (TOC) CM/SW Contact   R , LCSW Phone Number: 08/31/2020, 10:26 AM  Clinical Narrative: CSW met with patient and informed patient of current bed offers for skilled nursing facilities. CSW noted patient wanted a facility in [place] as that is where he lives. CSW reviewed offers and noted patient accepted [facility] as he knows where it is at in [place] and was the highest rated offer in that area. CSW provided patient with a list of current offers. CSW contacted their admissions liaison and noted due to a COVID outbreak they have suspended admissions likely for a week or longer. CSW notes patient is indecisive regarding a back-up option at this time.        Expected Discharge Plan: Skilled Nursing Facility Barriers to Discharge: Continued Medical Work up,SNF Pending bed offer  Expected Discharge Plan and Services Expected Discharge Plan: Skilled Nursing Facility     Post Acute Care Choice: Skilled Nursing Facility Living arrangements for the past 2 months: Single Family Home                                       Social Determinants of Health (SDOH) Interventions    Readmission Risk Interventions No flowsheet data found.  

## 2020-09-01 LAB — URINE CULTURE: Culture: >=100000 — AB

## 2020-09-01 NOTE — Progress Notes (Signed)
Subjective: Patient reports headache improving  Objective: Vital signs in last 24 hours: Temp:  [97.8 F (36.6 C)-98.3 F (36.8 C)] 97.8 F (36.6 C) (01/16 0727) Pulse Rate:  [62-78] 70 (01/16 0727) Resp:  [13-16] 15 (01/16 0727) BP: (117-132)/(69-76) 132/76 (01/16 0727) SpO2:  [92 %-96 %] 96 % (01/16 0727)  Intake/Output from previous day: 01/15 0701 - 01/16 0700 In: 360 [P.O.:360] Out: 1725 [Urine:1725] Intake/Output this shift: No intake/output data recorded.  Appears deconditioned FC x 4 Posterior neck flat to slightly sunken Dressings intact Abd soft  Lab Results: Recent Labs    08/30/20 0340  WBC 15.5*  HGB 15.3  HCT 44.0  PLT 395   BMET Recent Labs    08/30/20 0340  NA 138  K 4.0  CL 98  CO2 28  GLUCOSE 179*  BUN 15  CREATININE 0.76  CALCIUM 9.3    Studies/Results: No results found.  Assessment/Plan: S/p VPS - likely to SNF in [place] 09/01/2020, 11:08 AM

## 2020-09-02 MED ORDER — PROSOURCE PLUS PO LIQD
30.0000 mL | Freq: Two times a day (BID) | ORAL | Status: DC
  Administered 2020-09-03 – 2020-09-06 (×5): 30 mL via ORAL
  Filled 2020-09-02 (×6): qty 30

## 2020-09-02 NOTE — Progress Notes (Signed)
  NEUROSURGERY PROGRESS NOTE   No issues overnight.  Improvement in pre op gait, nausea, HA Had BM this am  EXAM:  BP 125/82 (BP Location: Right Arm)   Pulse 80   Temp 97.9 F (36.6 C) (Oral)   Resp 18   Ht 5\' 8"  (1.727 m)   Wt 79.4 kg   SpO2 96%   BMI 26.61 kg/m   Awake, alert, oriented  Speech fluent, appropriate  CN grossly intact  MAEW Incision: c/d/  IMPRESSION/PLAN 70 y.o. male POD4 lap assisted VP shunt. Neurologically stable. - SNF pending

## 2020-09-02 NOTE — Progress Notes (Signed)
Nutrition Follow-up  DOCUMENTATION CODES:   Not applicable  INTERVENTION:  Continue Ensure Enlive po BID, each supplement provides 350 kcal and 20 grams of protein  Provide 30 ml Prosource Plus po BID, each supplement provides 100 kcal and 15 grams of protein.   Encourage adequate PO intake.   NUTRITION DIAGNOSIS:   Inadequate oral intake related to nausea,vomiting as evidenced by per patient/family report; ongoing  GOAL:   Patient will meet greater than or equal to 90% of their needs; progressing  MONITOR:   PO intake,Supplement acceptance,Labs,Weight trends,I & O's  REASON FOR ASSESSMENT:   Malnutrition Screening Tool    ASSESSMENT:   70 y.o. 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   1/13 - s/p Laparoscopic Assisted right ventriculoperitoneal shunt placement  Meal completion has been varied from 25-90%. Pt currently has Ensure ordered and has been consuming most of them. Nausea improving. RD to continue with current Ensure orders as well as additionally ordering Prosource plus to aid in caloric and protein needs. Labs and medications reviewed.   Diet Order:   Diet Order            Diet regular Room service appropriate? Yes with Assist; Fluid consistency: Thin  Diet effective now                 EDUCATION NEEDS:   No education needs have been identified at this time  Skin:  Skin Assessment: Reviewed RN Assessment Skin Integrity Issues:: Stage I Stage I: coccyx  Last BM:  1/17  Height:   Ht Readings from Last 1 Encounters:  08/29/20 5\' 8"  (1.727 m)    Weight:   Wt Readings from Last 1 Encounters:  08/29/20 79.4 kg   BMI:  Body mass index is 26.61 kg/m.  Estimated Nutritional Needs:   Kcal:  1850-2050  Protein:  85-100g  Fluid:  2L/day  Corrin Parker, MS, RD, LDN RD pager number/after hours weekend pager number on Amion.

## 2020-09-02 NOTE — Progress Notes (Signed)
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 11/13  S/P VP shunt.  PMH includes: HTN and GERD.    PT Comments    Progressing well toward goals.  Improved stability overall, good use of the visual/stabilization/compensation during transfers and gait.  Pt did not get nauseated during the entire session.   Follow Up Recommendations  SNF     Equipment Recommendations  Other (comment) (TBA)    Recommendations for Other Services       Precautions / Restrictions Precautions Precautions: Fall    Mobility  Bed Mobility Overal bed mobility: Needs Assistance Bed Mobility: Supine to Sit     Supine to sit: Supervision        Transfers Overall transfer level: Needs assistance Equipment used: 4-wheeled walker Transfers: Sit to/from Stand Sit to Stand: Min guard         General transfer comment: cues for safety  Ambulation/Gait Ambulation/Gait assistance: Min guard;Min assist Gait Distance (Feet): 300 Feet Assistive device: 4-wheeled walker Gait Pattern/deviations: Step-through pattern;Decreased step length - right;Decreased step length - left;Decreased stride length Gait velocity: decreased Gait velocity interpretation: 1.31 - 2.62 ft/sec, indicative of limited community ambulator General Gait Details: mildly unsteady at times, good use to the Rollator, less "waggle",  reinforced compensation/visual stabilization and pt used the technique well and was successful in not getting nauseous   Stairs             Wheelchair Mobility    Modified Rankin (Stroke Patients Only)       Balance     Sitting balance-Leahy Scale: Fair     Standing balance support: Single extremity supported;Bilateral upper extremity supported Standing  balance-Leahy Scale: Poor Standing balance comment: reliant on BUE support                            Cognition Arousal/Alertness: Awake/alert Behavior During Therapy: WFL for tasks assessed/performed Overall Cognitive Status: Within Functional Limits for tasks assessed (NT formally)                                        Exercises      General Comments        Pertinent Vitals/Pain Faces Pain Scale: No hurt Pain Intervention(s): Monitored during session    Home Living                      Prior Function            PT Goals (current goals can now be found in the care plan section) Acute Rehab PT Goals Patient Stated Goal: to get back to living life PT Goal Formulation: With patient Time For Goal Achievement: 09/09/20 Potential to Achieve Goals: Good Progress towards PT goals: Progressing toward goals    Frequency    Min 3X/week      PT Plan Current plan remains appropriate    Co-evaluation              AM-PAC PT "6 Clicks" Mobility   Outcome Measure  Help needed turning from your back to your side while in a flat bed without using bedrails?: A Little Help needed  moving from lying on your back to sitting on the side of a flat bed without using bedrails?: A Little Help needed moving to and from a bed to a chair (including a wheelchair)?: A Little Help needed standing up from a chair using your arms (e.g., wheelchair or bedside chair)?: A Little Help needed to walk in hospital room?: A Little Help needed climbing 3-5 steps with a railing? : A Lot 6 Click Score: 17    End of Session   Activity Tolerance: Patient tolerated treatment well Patient left: Other (comment);with call bell/phone within reach (left on the toilet) Nurse Communication: Mobility status;Other (comment) (nurse Karle Starch made aware pt on the toitet.) PT Visit Diagnosis: Unsteadiness on feet (R26.81);Other abnormalities of gait and mobility  (R26.89);Muscle weakness (generalized) (M62.81)     Time: 1740-1801 PT Time Calculation (min) (ACUTE ONLY): 21 min  Charges:  $Gait Training: 8-22 mins                     09/02/2020  Jose Ramirez., PT Acute Rehabilitation Services [phone]  (pager) [phone]  (office)   Jose Ramirez 09/02/2020, 6:22 PM

## 2020-09-03 NOTE — Progress Notes (Signed)
  NEUROSURGERY PROGRESS NOTE   No issues overnight.  Episode of emesis this am which he attributes to "hot water". Able to eat breakfast without difficulties. No concerns this am  EXAM:  BP 129/86 (BP Location: Right Arm)   Pulse 80   Temp 97.6 F (36.4 C) (Oral)   Resp 20   Ht 5\' 8"  (1.727 m)   Wt 79.4 kg   SpO2 99%   BMI 26.61 kg/m   Awake, alert, oriented  Speech fluent, appropriate  CN grossly intact  MAEW Incision: c/d/i  IMPRESSION/PLAN 70 y.o. male POD5 VP shunt. Neurologically stable. - SNF pending

## 2020-09-03 NOTE — TOC Progression Note (Signed)
Transition of Care [facility]) - Progression Note    Patient Details  Name: Jose Ramirez MRN: [id number] Date of Birth: August 09, 1951  Transition of Care [facility]) CM/SW Contact  Sharlet Salina Mila Homer, LCSW Phone Number: 09/03/2020, 6:04 PM  Clinical Narrative:  CSW talked with the patient at bedside regarding another facility and he selected  [facility]. CSW will f/u with this facility on 1/19 regarding bed availability.    Expected Discharge Plan: [place] Barriers to Discharge: Continued Medical Work up,SNF Pending bed offer  Expected Discharge Plan and Services Expected Discharge Plan: [employer] Choice: [place] arrangements for the past 2 months: Single Family Home                                     Social Determinants of Health (SDOH) Interventions  No SDOH interventions requested or needed at this time   Readmission Risk Interventions No flowsheet data found.

## 2020-09-04 NOTE — TOC Progression Note (Signed)
Transition of Care [facility]) - Progression Note    Patient Details  Name: Jose Ramirez MRN: [id number] Date of Birth: March 01, 1951  Transition of Care [facility]) CM/SW Contact  Sharlet Salina Mila Homer, LCSW Phone Number: 09/04/2020, 11:33 AM  Clinical Narrative:  Call made to Maudie Mercury, admissions liaison with [facility] facilities and was advised that [facility] is closed due to a COVID outbreak.     Expected Discharge Plan: [place] Barriers to Discharge: Continued Medical Work up,SNF Pending bed offer  Expected Discharge Plan and Services Expected Discharge Plan: [place] Choice: [place] arrangements for the past 2 months: Single Family Home                                     Social Determinants of Health (SDOH) Interventions  No SDOH interventions requested or needed at this time  Readmission Risk Interventions No flowsheet data found.

## 2020-09-04 NOTE — Progress Notes (Signed)
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 11/13  S/P VP shunt.  PMH includes: HTN and GERD.    PT Comments    Pt making significant improvement toward mobility goals.  Pt reports has nausea before mobility but with visual stabilization techniques, patient able to ambulate in the halls with scanning only when necessary and completes without significant increases in nausea.  Supervision overall with notable improvements in coordination with mobility.   Follow Up Recommendations  SNF     Equipment Recommendations  Other (comment) (TBA)    Recommendations for Other Services       Precautions / Restrictions Precautions Precautions: Fall Restrictions Weight Bearing Restrictions: No    Mobility  Bed Mobility Overal bed mobility: Needs Assistance Bed Mobility: Supine to Sit     Supine to sit: Supervision Sit to supine: Supervision   General bed mobility comments: cues for visual compensatory strategy  Transfers Overall transfer level: Needs assistance Equipment used: 4-wheeled walker Transfers: Sit to/from Stand Sit to Stand: Min guard         General transfer comment: cues for safety  Ambulation/Gait Ambulation/Gait assistance: Min guard   Assistive device: 4-wheeled walker Gait Pattern/deviations: Step-through pattern;Decreased step length - right;Decreased step length - left;Decreased stride length Gait velocity: decreased   General Gait Details: mildly unsteady at times, good use to the Rollator, less "waggle",  reinforced compensation/visual stabilization and pt used the technique well and was successful in not getting nauseous   Stairs             Wheelchair Mobility    Modified Rankin (Stroke Patients Only)        Balance Overall balance assessment: Needs assistance   Sitting balance-Leahy Scale: Fair     Standing balance support: Single extremity supported;Bilateral upper extremity supported Standing balance-Leahy Scale: Poor Standing balance comment: reliant on BUE support                            Cognition Arousal/Alertness: Awake/alert Behavior During Therapy: WFL for tasks assessed/performed Overall Cognitive Status: Within Functional Limits for tasks assessed (NT formally) Area of Impairment: Attention;Memory;Problem solving                   Current Attention Level: Selective Memory: Decreased short-term memory   Safety/Judgement: Decreased awareness of safety;Decreased awareness of deficits Awareness: Emergent Problem Solving: Slow processing;Requires verbal cues General Comments: cues for increased carryover of visual stabilization compensations.,      Exercises General Exercises - Upper Extremity Shoulder Flexion: AROM;Both;Theraband Theraband Level (Shoulder Flexion): Level 1 (Yellow) Shoulder Horizontal ABduction: AROM;Both;10 reps;Seated;Theraband Theraband Level (Shoulder Horizontal Abduction): Level 1 (Yellow) Shoulder Horizontal ADduction: AROM;Both;10 reps;Seated;Theraband Theraband Level (Shoulder Horizontal Adduction): Level 1 (Yellow) Elbow Flexion: AROM;Both;10 reps;Seated;Theraband Theraband Level (Elbow Flexion): Level 1 (Yellow) Elbow Extension: AROM;Both;10 reps;Seated;Theraband Theraband Level (Elbow Extension): Level 1 (Yellow)    General Comments        Pertinent Vitals/Pain Pain Assessment: Faces Faces Pain Scale: No hurt Pain Intervention(s): Monitored during session    Home Living                      Prior Function            PT Goals (current  goals can now be found in the care plan section) Acute Rehab PT Goals Patient Stated Goal: to get back to living life PT Goal Formulation: With patient Time  For Goal Achievement: 09/09/20 Potential to Achieve Goals: Good    Frequency    Min 3X/week      PT Plan      Co-evaluation              AM-PAC PT "6 Clicks" Mobility   Outcome Measure  Help needed turning from your back to your side while in a flat bed without using bedrails?: A Little Help needed moving from lying on your back to sitting on the side of a flat bed without using bedrails?: A Little Help needed moving to and from a bed to a chair (including a wheelchair)?: A Little Help needed standing up from a chair using your arms (e.g., wheelchair or bedside chair)?: A Little Help needed to walk in hospital room?: A Little Help needed climbing 3-5 steps with a railing? : A Lot 6 Click Score: 17    End of Session   Activity Tolerance: Patient tolerated treatment well Patient left: Other (comment);with call bell/phone within reach (left on the toilet) Nurse Communication: Mobility status;Other (comment) (nurse Karle Starch made aware pt on the toitet.) PT Visit Diagnosis: Unsteadiness on feet (R26.81);Other abnormalities of gait and mobility (R26.89);Muscle weakness (generalized) (M62.81)     Time: 2025-4270 PT Time Calculation (min) (ACUTE ONLY): 21 min  Charges:  $Gait Training: 8-22 mins                     09/04/2020  [name], PT Acute Rehabilitation Services [phone]  (pager) [phone]  (office)   [name] Hershell Brandl 09/04/2020, 3:35 PM

## 2020-09-04 NOTE — Progress Notes (Addendum)
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 11/13  S/P VP shunt.  PMH includes: HTN and GERD.   OT comments  Pt with progress towards OT goals. Pt performing room level mobility using rollator at minguard assist level and completing seated ADL at sink with supervision throughout. Pt does require frequent cues for carryover of gaze stabilization/habituation exercises during ADL tasks and with episode of nausea/dry heaving during oral care. Issued level 1 theraband and educated in [place] for continued strengthening/endurance with pt carrying over given min cues while seated EOB. D/c recommendations updated as noted CIR signed off at this time. Recommend follow up therapy services in SNF setting. Will continue to follow while pt remains acutely admitted.    Follow Up Recommendations  SNF;Supervision/Assistance - 24 hour    Equipment Recommendations  3 in 1 bedside commode;Other (comment) (will further assess)          Precautions / Restrictions Precautions Precautions: Fall Restrictions Weight Bearing Restrictions: No       Mobility Bed Mobility Overal bed mobility: Needs Assistance Bed Mobility: Supine to Sit;Sit to Supine     Supine to sit: Supervision Sit to supine: Supervision   General bed mobility comments: cues for visual compensatory strategy  Transfers Overall transfer level: Needs assistance Equipment used: 4-wheeled walker Transfers: Sit to/from Stand Sit to Stand: Min guard         General transfer comment: cues for safety, stood from EOB and seat of rollator    Balance Overall balance assessment: Needs assistance   Sitting balance-Leahy Scale: Fair     Standing balance support: Single extremity  supported;Bilateral upper extremity supported Standing balance-Leahy Scale: Poor Standing balance comment: reliant on BUE support                           ADL either performed or assessed with clinical judgement   ADL Overall ADL's : Needs assistance/impaired     Grooming: Supervision/safety;Sitting;Oral care;Wash/dry face Grooming Details (indicate cue type and reason): sitting on rollator at sink in bathroom             Lower Body Dressing: Moderate assistance;Sit to/from stand Lower Body Dressing Details (indicate cue type and reason): assist to adjust socks today             Functional mobility during ADLs: Min guard (rollator) General ADL Comments: pt requiring cues for carryover/use of gaze stabilization techniques during ADL/mobility tasks today                       Cognition Arousal/Alertness: Awake/alert Behavior During Therapy: WFL for tasks assessed/performed Overall Cognitive Status: Impaired/Different from baseline (NT formally) Area of Impairment: Attention;Memory;Problem solving                   Current Attention Level: Selective Memory: Decreased short-term memory       Problem Solving: Slow processing;Requires verbal cues General Comments: pt requiring increased cues for carryover of habituation exercises during ADL/functional tasks today        Exercises Exercises: General Upper Extremity General Exercises - Upper Extremity Shoulder Flexion: AROM;Both;Theraband Theraband Level (Shoulder Flexion): Level 1 (Yellow) Shoulder Horizontal ABduction: AROM;Both;10 reps;Seated;Theraband Theraband Level (Shoulder Horizontal Abduction): Level 1 (Yellow) Shoulder  Horizontal ADduction: AROM;Both;10 reps;Seated;Theraband Theraband Level (Shoulder Horizontal Adduction): Level 1 (Yellow) Elbow Flexion: AROM;Both;10 reps;Seated;Theraband Theraband Level (Elbow Flexion): Level 1 (Yellow) Elbow Extension: AROM;Both;10  reps;Seated;Theraband Theraband Level (Elbow Extension): Level 1 (Yellow)   Shoulder Instructions       General Comments      Pertinent Vitals/ Pain       Pain Assessment: Faces Faces Pain Scale: No hurt Pain Intervention(s): Monitored during session  Home Living                                          Prior Functioning/Environment              Frequency  Min 2X/week        Progress Toward Goals  OT Goals(current goals can now be found in the care plan section)  Progress towards OT goals: Progressing toward goals  Acute Rehab OT Goals Patient Stated Goal: to get back to living life OT Goal Formulation: With patient Time For Goal Achievement: 09/10/20 Potential to Achieve Goals: Good ADL Goals Pt Will Perform Grooming: with modified independence;sitting Pt Will Perform Upper Body Bathing: with modified independence;sitting Pt Will Perform Lower Body Bathing: with min guard assist;sit to/from stand;sitting/lateral leans Pt Will Perform Upper Body Dressing: with set-up;with supervision;sitting Pt Will Perform Lower Body Dressing: with min guard assist;sit to/from stand;sitting/lateral leans Pt Will Transfer to Toilet: ambulating;with min guard assist;bedside commode Pt/caregiver will Perform Home Exercise Program: With written HEP provided;Both right and left upper extremity;With theraband;Increased strength;With Supervision Additional ADL Goal #1: Pt will complete habituation exercises for central vertigo with min VC  Plan Discharge plan needs to be updated    Co-evaluation                 AM-PAC OT "6 Clicks" Daily Activity     Outcome Measure   Help from another person eating meals?: A Little Help from another person taking care of personal grooming?: A Little Help from another person toileting, which includes using toliet, bedpan, or urinal?: A Little Help from another person bathing (including washing, rinsing, drying)?: A  Little Help from another person to put on and taking off regular upper body clothing?: A Little Help from another person to put on and taking off regular lower body clothing?: A Little 6 Click Score: 18    End of Session Equipment Utilized During Treatment: Other (comment) (rollator)  OT Visit Diagnosis: Unsteadiness on feet (R26.81);Other abnormalities of gait and mobility (R26.89);Muscle weakness (generalized) (M62.81);History of falling (Z91.81);Ataxia, unspecified (R27.0);Other symptoms and signs involving cognitive function;Pain;Dizziness and giddiness (R42) Pain - part of body:  (nausea)   Activity Tolerance Patient tolerated treatment well   Patient Left in bed;with call bell/phone within reach;with bed alarm set   Nurse Communication Mobility status        Time: 6734-1937 OT Time Calculation (min): 27 min  Charges: OT General Charges $OT Visit: 1 Visit OT Treatments $Self Care/Home Management : 8-22 mins $Therapeutic Activity: 8-22 mins  Lou Cal, OT Acute Rehabilitation Services Pager [phone] Office [phone]    Raymondo Band 09/04/2020, 2:43 PM

## 2020-09-04 NOTE — Progress Notes (Addendum)
Infectious disease called and let this nurse know pt needed to be on contact precautions for ESBL in the Urine. This nurse placed contact isolation caddy on pt door. Katherina Right RN

## 2020-09-04 NOTE — Progress Notes (Signed)
  NEUROSURGERY PROGRESS NOTE   Nauseated overnight and this am without vomiting. Has not been asking for anti-emetic. Still tolerating po No other concerns this am  EXAM:  BP 129/76 (BP Location: Right Arm)   Pulse 72   Temp 97.7 F (36.5 C) (Oral)   Resp 20   Ht 5\' 8"  (1.727 m)   Wt 79.4 kg   SpO2 94%   BMI 26.61 kg/m   Awake, alert, oriented  Speech fluent, appropriate  CN grossly intact MAEW Incision: c/d/i  IMPRESSION/PLAN 70 y.o. male POD6 VP shunt. Neurologically stable. - nausea: zofran prn. Will monitor po intake -SNF pending

## 2020-09-05 LAB — SARS CORONAVIRUS 2 (TAT 6-24 HRS): SARS Coronavirus 2: NEGATIVE

## 2020-09-05 NOTE — Progress Notes (Signed)
IV access loss due to accidentally being removed by patient. Pt refused new IV at this time. No iV meds to give and patient plan to d/c tomorrow.

## 2020-09-05 NOTE — Progress Notes (Signed)
Spoke with infectious disease nurse today and they stated that the orders for contact isolation had not been placed on the chart. This nurse inquired about where the infection was and she indicated it was in the urine not the blood. Will reach out to neurosurgery to clarify. Contact isolation orders placed on chart.  Katherina Right RN

## 2020-09-05 NOTE — Progress Notes (Signed)
  NEUROSURGERY PROGRESS NOTE   No issues overnight.  Patient marked as bacteremic in error.  EXAM:  BP 107/71 (BP Location: Right Arm)   Pulse 80   Temp 97.8 F (36.6 C)   Resp 18   Ht 5\' 8"  (1.727 m)   Wt 79.4 kg   SpO2 95%   BMI 26.61 kg/m   Awake, alert, oriented  Speech fluent, appropriate  CN grossly intact MAEW Incision: c/d/i  IMPRESSION/PLAN 70 y.o. male POD7VP shunt. Neurologically stable. - nausea: much improved.  -SNF pending

## 2020-09-05 NOTE — Care Management Important Message (Signed)
Important Message  Patient Details  Name: Jose Ramirez MRN: [id number] Date of Birth: 05-Mar-1951   Medicare Important Message Given:  Yes     Morene Cecilio Montine Circle 09/05/2020, 8:28 AM

## 2020-09-05 NOTE — Progress Notes (Signed)
Pt still has not had bm since Sunday. Gave him some warm prune juice. Ambulated pt in hall. Pt tolerated well. He denied having any dizziness or weakness while walking. When we got back to the room pt sat on seat of rollater while this nurse made up the bed. When pt stood up to get in the bed he reported feeling dizzy then nauseated and then he vomited. Vomit looked like the prune juice. Zofran administered. Pt reporting feeling better. Katherina Right RN

## 2020-09-06 DIAGNOSIS — D496 Neoplasm of unspecified behavior of brain: Secondary | ICD-10-CM | POA: Diagnosis not present

## 2020-09-06 DIAGNOSIS — I69891 Dysphagia following other cerebrovascular disease: Secondary | ICD-10-CM | POA: Diagnosis not present

## 2020-09-06 DIAGNOSIS — I69828 Other speech and language deficits following other cerebrovascular disease: Secondary | ICD-10-CM | POA: Diagnosis not present

## 2020-09-06 DIAGNOSIS — R41841 Cognitive communication deficit: Secondary | ICD-10-CM | POA: Diagnosis not present

## 2020-09-06 DIAGNOSIS — R1313 Dysphagia, pharyngeal phase: Secondary | ICD-10-CM | POA: Diagnosis not present

## 2020-09-06 DIAGNOSIS — I10 Essential (primary) hypertension: Secondary | ICD-10-CM | POA: Diagnosis not present

## 2020-09-06 DIAGNOSIS — J069 Acute upper respiratory infection, unspecified: Secondary | ICD-10-CM | POA: Diagnosis not present

## 2020-09-06 DIAGNOSIS — K219 Gastro-esophageal reflux disease without esophagitis: Secondary | ICD-10-CM | POA: Diagnosis not present

## 2020-09-06 DIAGNOSIS — L8990 Pressure ulcer of unspecified site, unspecified stage: Secondary | ICD-10-CM | POA: Diagnosis not present

## 2020-09-06 DIAGNOSIS — D431 Neoplasm of uncertain behavior of brain, infratentorial: Secondary | ICD-10-CM | POA: Diagnosis not present

## 2020-09-06 DIAGNOSIS — R739 Hyperglycemia, unspecified: Secondary | ICD-10-CM | POA: Diagnosis not present

## 2020-09-06 DIAGNOSIS — R2681 Unsteadiness on feet: Secondary | ICD-10-CM | POA: Diagnosis not present

## 2020-09-06 DIAGNOSIS — Z20818 Contact with and (suspected) exposure to other bacterial communicable diseases: Secondary | ICD-10-CM | POA: Diagnosis not present

## 2020-09-06 DIAGNOSIS — R42 Dizziness and giddiness: Secondary | ICD-10-CM | POA: Diagnosis not present

## 2020-09-06 DIAGNOSIS — M6281 Muscle weakness (generalized): Secondary | ICD-10-CM | POA: Diagnosis not present

## 2020-09-06 DIAGNOSIS — R110 Nausea: Secondary | ICD-10-CM | POA: Diagnosis not present

## 2020-09-06 DIAGNOSIS — G919 Hydrocephalus, unspecified: Secondary | ICD-10-CM | POA: Diagnosis not present

## 2020-09-06 DIAGNOSIS — F5101 Primary insomnia: Secondary | ICD-10-CM | POA: Diagnosis not present

## 2020-09-06 DIAGNOSIS — E785 Hyperlipidemia, unspecified: Secondary | ICD-10-CM | POA: Diagnosis not present

## 2020-09-06 MED ORDER — ONDANSETRON HCL 4 MG PO TABS: 4.0000 mg | ORAL_TABLET | Freq: Every day | ORAL | 1 refills | Status: DC | PRN

## 2020-09-06 NOTE — TOC Progression Note (Signed)
Transition of Care [facility]) - Progression Note    Patient Details  Name: Jose Ramirez MRN: [id number] Date of Birth: 06/30/1951  Transition of Care [facility]) CM/SW [place], [place] Phone Number: 09/06/2020, 1:12 PM  Clinical Narrative:     CSW visit with patient at bedside. CSW explained [facility] has hold on their admission due to covid. Admissions for [facility] advised [facility] can not admit until Monday. Patient inquired about waiting until Monday-he would be closer to home.  CSW advised the patient, hospital can not hold him until Monday if another SNF had availability.CSW provided patient with Medicare.gov rating list with bed offers and encourage patient to review bed offers in  [place].   CSW will continue to follow and assist with discharge planning.   Thurmond Butts, MSW, LCSW Clinical Social Worker   Expected Discharge Plan: Skilled Nursing Facility Barriers to Discharge: Continued Medical Work up,SNF Pending bed offer  Expected Discharge Plan and Services Expected Discharge Plan: [place] Choice: [place] arrangements for the past 2 months: Single Family Home Expected Discharge Date: 09/06/20                                     Social Determinants of Health (SDOH) Interventions    Readmission Risk Interventions No flowsheet data found.

## 2020-09-06 NOTE — Progress Notes (Signed)
PTAR at bedside, pt's belongings including wallet, phone and glasses with patient as well as rolling walker.  AVS documentation given to PTAR in envelope, no IV present on patient

## 2020-09-06 NOTE — Progress Notes (Signed)
Given report to nurse at [facility]. Questions asked have been answered.

## 2020-09-06 NOTE — Progress Notes (Signed)
  NEUROSURGERY PROGRESS NOTE   No issues overnight.  intermittent nausea with vomiting although still tolerating po. Had BM yesterday. Feels improved with ambulation No concerns this am  EXAM:  BP 111/72 (BP Location: Right Arm)   Pulse 83   Temp 98.2 F (36.8 C)   Resp 16   Ht 5\' 8"  (1.727 m)   Wt 79.4 kg   SpO2 98%   BMI 26.61 kg/m   Awake, alert, oriented  Speech fluent, appropriate  CN grossly intact MAEW Incision: c/d/i  IMPRESSION/PLAN 70 y.o. male  POD8VP shunt. Neurologically stable. -I see notes from nursing that patient is d/c today to SNF. Will await CSW input. Discharge orders signed.

## 2020-09-06 NOTE — Discharge Summary (Signed)
Physician Discharge Summary  Patient ID: Jose Ramirez MRN: [id number] DOB/AGE: [birth year] 70 y.o.  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 male who presented to the ED 08/23/2020 for persistent nausea, vomiting and gait instability after undergoing suboccipital craniectomy for resection of large cerebellar mass 2.5 months prior who was found to have delayed hydrocephalus. He was admitted to ICU for eventual VPS. He underwent lap assisted VP shunt on 08/29/2020. He had an uncomplicated hospital course, although on POD 1 remembered he was recently diagnosed with UTI and had not been on abx since admission. Macrobid was restarted and cultures were obtained. He completed course of abx. He worked with PT/OT and was rec for SNF. ON 1/21, insurance approval was obtained and a bed was available. Patient was discharged in hemodynically stable condition. Will need to f/u outpatient in 1 week for staple removal.   Treatments: Surgery  VPS  Discharge Exam: Blood pressure 111/72, pulse 83, temperature 98.2 F (36.8 C), resp. rate 16, height 5\' 8"  (1.727 m), weight 79.4 kg, SpO2 98 %. Awake, alert, oriented Speech fluent, appropriate CN grossly intact MAE Wound c/d/i  Disposition: Discharge disposition: 03-Skilled Nursing Facility       Discharge Instructions    Call MD for:  difficulty breathing, headache or visual disturbances   Complete by: As directed    Call MD for:  persistant dizziness or light-headedness   Complete by: As directed    Call MD for:  redness, tenderness, or signs of infection (pain, swelling, redness, odor or green/yellow discharge around incision site)   Complete by: As directed    Call MD for:  severe  uncontrolled pain   Complete by: As directed    Call MD for:  temperature >100.4   Complete by: As directed    Diet - low sodium heart healthy   Complete by: As directed    Increase activity slowly   Complete by: As directed    No wound care   Complete by: As directed      Allergies as of 09/06/2020   No Known Allergies     Medication List    STOP taking these medications   nitrofurantoin (macrocrystal-monohydrate) 100 MG capsule Commonly known as: MACROBID   oxyCODONE-acetaminophen 7.5-325 MG tablet Commonly known as: Percocet     TAKE these medications   Fish Oil 1000 MG Caps Take 2,000 mg by mouth in the morning and at bedtime.   meclizine 25 MG tablet Commonly known as: ANTIVERT Take 25 mg by mouth 2 (two) times daily as needed for dizziness or nausea.   metoprolol tartrate 50 MG tablet Commonly known as: LOPRESSOR Take 50 mg by mouth 2 (two) times daily.   multivitamin with minerals Tabs tablet Take 1 tablet by mouth daily.   ondansetron 4 MG tablet Commonly known as: Zofran Take 1 tablet (4 mg total) by mouth daily as needed for nausea or vomiting.   simvastatin 40 MG tablet Commonly known as: ZOCOR Take 40 mg by mouth at bedtime.   traZODone 150 MG tablet Commonly known as: DESYREL Take 150-300 mg by mouth at bedtime.       Follow-up Information    Consuella Lose, MD. Schedule an appointment as soon as possible for a visit  in 1 week(s).   Specialty: Neurosurgery Why: For suture removal Contact information: [address] [place] [address] [phone]               Signed: Traci Sermon 09/06/2020, 7:58 AM

## 2020-09-06 NOTE — Progress Notes (Signed)
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 11/13  S/P VP shunt.  PMH includes: HTN and GERD.   OT comments  Pt vomiting during session with oral care and requires mod cues during session for gaze stabilization. Pt with symptoms during habituation exercises and needing rest breaks. Recommendation for SNF at this time.    Follow Up Recommendations  SNF;Supervision/Assistance - 24 hour    Equipment Recommendations  3 in 1 bedside commode;Other (comment) (RW)    Recommendations for Other Services      Precautions / Restrictions Precautions Precautions: Fall Precaution Comments: pt reports falling 1x/month at home       Mobility Bed Mobility               General bed mobility comments: oob on arrival  Transfers Overall transfer level: Needs assistance Equipment used: 4-wheeled walker Transfers: Sit to/from Stand Sit to Stand: Min guard         General transfer comment: good recall of locking brakes    Balance Overall balance assessment: Needs assistance Sitting-balance support: No upper extremity supported Sitting balance-Leahy Scale: Normal     Standing balance support: Bilateral upper extremity supported;During functional activity Standing balance-Leahy Scale: Fair Standing balance comment: reliant on BUE support                           ADL either performed or assessed with clinical judgement   ADL Overall ADL's : Needs assistance/impaired   Eating/Feeding Details (indicate cue type and reason): completed breakfast tray present Grooming: Oral care;Sitting;Supervision/safety Grooming Details (indicate cue type and reason): pt begins to gag with oral care and describes this as normal pt then vomitting  due to gagging and reports that is not normal                               General ADL Comments: pt educated on theraband exercises and able to recall 2 from previous session. no handout located in room so OT print new handout and re issue to patient. Pt with gaze stabilization and VOR exercises with nausea     Vision       Perception     Praxis      Cognition Arousal/Alertness: Awake/alert Behavior During Therapy: WFL for tasks assessed/performed Overall Cognitive Status: Within Functional Limits for tasks assessed                                 General Comments: mod cues for gaze stabilization in functional task but can verbalize what he is suppose to do        Exercises General Exercises - Upper Extremity Shoulder Flexion: AROM;Both;Theraband Theraband Level (Shoulder Flexion): Level 1 (Yellow) Shoulder ABduction: AROM;Both;10 reps;Seated Theraband Level (Shoulder Abduction): Level 1 (Yellow) Elbow Flexion: AROM;Both;10 reps;Seated;Theraband Other Exercises Other Exercises: gaze stabilization   Shoulder Instructions       General Comments VSS    Pertinent Vitals/ Pain       Pain Assessment: No/denies pain (nausea during session)  Home Living  Prior Functioning/Environment              Frequency  Min 2X/week        Progress Toward Goals  OT Goals(current goals can now be found in the care plan section)  Progress towards OT goals: Progressing toward goals  Acute Rehab OT Goals Patient Stated Goal: to find out what rehab i am going to OT Goal Formulation: With patient Time For Goal Achievement: 09/10/20 Potential to Achieve Goals: Good ADL Goals Pt Will Perform Grooming: with modified independence;sitting Pt Will Perform Upper Body Bathing: with modified independence;sitting Pt Will Perform Lower Body Bathing: with min guard assist;sit to/from  stand;sitting/lateral leans Pt Will Perform Upper Body Dressing: with set-up;with supervision;sitting Pt Will Perform Lower Body Dressing: with min guard assist;sit to/from stand;sitting/lateral leans Pt Will Transfer to Toilet: ambulating;with min guard assist;bedside commode Pt/caregiver will Perform Home Exercise Program: With written HEP provided;Both right and left upper extremity;With theraband;Increased strength;With Supervision Additional ADL Goal #1: Pt will complete habituation exercises for central vertigo with min VC  Plan Discharge plan needs to be updated    Co-evaluation                 AM-PAC OT "6 Clicks" Daily Activity     Outcome Measure   Help from another person eating meals?: A Little Help from another person taking care of personal grooming?: A Little Help from another person toileting, which includes using toliet, bedpan, or urinal?: A Little Help from another person bathing (including washing, rinsing, drying)?: A Little Help from another person to put on and taking off regular upper body clothing?: A Little Help from another person to put on and taking off regular lower body clothing?: A Little 6 Click Score: 18    End of Session Equipment Utilized During Treatment: Other (comment) (rollator)  OT Visit Diagnosis: Unsteadiness on feet (R26.81);Other abnormalities of gait and mobility (R26.89);Muscle weakness (generalized) (M62.81);History of falling (Z91.81);Ataxia, unspecified (R27.0);Other symptoms and signs involving cognitive function;Pain;Dizziness and giddiness (R42)   Activity Tolerance Patient tolerated treatment well   Patient Left in chair;with call bell/phone within reach;with chair alarm set   Nurse Communication Mobility status;Precautions        Time: 2297-9892 OT Time Calculation (min): 19 min  Charges: OT General Charges $OT Visit: 1 Visit OT Treatments $Self Care/Home Management : 8-22 mins   Brynn, OTR/L  Acute  Rehabilitation Services Pager: [phone] Office: [phone] . Jeri Modena 09/06/2020, 9:17 AM

## 2020-09-06 NOTE — TOC Progression Note (Signed)
Transition of Care [facility]) - Progression Note    Patient Details  Name: Hoover Grewe MRN: [id number] Date of Birth: [birth year]  Transition of Care [facility]) CM/SW [place], [place] Phone Number: 09/06/2020, 2:55 PM  Clinical Narrative:     CSW visit with patient at bedside. Patient selected [facility]- CSW contacted [facility] Admission, SNF admission on hold due to covid.  Patient decided on [facility]. [facility]  confirmed bed offer and can admit today- she requested to wait until she call back to to set up PTAR.  CSW sent discharge summary to SNF CSW will continue to follow and assist with discharge planning.  Thurmond Butts, MSW, LCSW Clinical Social Worker   Expected Discharge Plan: Skilled Nursing Facility Barriers to Discharge: Continued Medical Work up,SNF Pending bed offer  Expected Discharge Plan and Services Expected Discharge Plan: [place] Choice: [place] arrangements for the past 2 months: Single Family Home Expected Discharge Date: 09/06/20                                     Social Determinants of Health (SDOH) Interventions    Readmission Risk Interventions No flowsheet data found.

## 2020-09-06 NOTE — TOC Transition Note (Signed)
Transition of Care [facility]) - CM/SW Discharge Note   Patient Details  Name: Jose Ramirez MRN: [id number] Date of Birth: 03/19/1951  Transition of Care [facility]) CM/SW Contact:  Vinie Sill, [place] Phone Number: 09/06/2020, 3:49 PM   Clinical Narrative:     Patient will DC to: [facility]   DC Date: 09/06/20 Family Notified:Tommy,brother-left voice message  Transport FX:JOIT   Per MD patient is ready for discharge. RN, patient, and facility notified of DC. Discharge Summary sent to facility. RN given number for [phone]. Ambulance transport requested for patient.   Clinical Social Worker signing off.  Thurmond Butts, MSW, LCSW Clinical Social Worker    Barriers to Discharge: Continued Medical Work up,SNF Pending bed offer   Patient Goals and CMS Choice Patient states their goals for this hospitalization and ongoing recovery are:: "get back to normal life" CMS Medicare.gov Compare Post Acute Care list provided to:: Patient Choice offered to / list presented to : Patient  Discharge Placement                       Discharge Plan and Services     Post Acute Care Choice: [place]                               Social Determinants of Health (SDOH) Interventions     Readmission Risk Interventions No flowsheet data found.

## 2020-09-06 NOTE — Progress Notes (Signed)
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 11/13  S/P VP shunt.  PMH includes: HTN and GERD.    PT Comments    Pt tolerates treatment well, ambulating for limited community distances. Pt defers dynamic gait challenges, avoiding navigating around objects or opening doors due to concern for instability. Pt is able to ambulate in a straight line and perform wide and slowed turns without LOB at this time. Pt has limited caregiver support at home and has a significant falls history. Pt will benefit from continued acute PT services to improve dynamic balance and to reduce falls risk. PT continues to recommend SNF placement.   Follow Up Recommendations  SNF     Equipment Recommendations  None recommended by PT    Recommendations for Other Services       Precautions / Restrictions Precautions Precautions: Fall Precaution Comments: pt reports falling 1x/month at home Restrictions Weight Bearing Restrictions: No    Mobility  Bed Mobility Overal bed mobility: Needs Assistance Bed Mobility: Supine to Sit     Supine to sit: Modified independent (Device/Increase time)     General bed mobility comments: oob on arrival  Transfers Overall transfer level: Needs assistance Equipment used: 4-wheeled walker Transfers: Sit to/from Stand Sit to Stand: Min guard         General transfer comment: good recall of locking brakes  Ambulation/Gait Ambulation/Gait assistance: Min guard;Supervision Gait Distance (Feet): 400 Feet Assistive device: 4-wheeled walker Gait Pattern/deviations: Step-through pattern Gait velocity: reduced Gait velocity interpretation: 1.31 - 2.62 ft/sec, indicative of limited community ambulator General Gait Details: pt with  slowed step-through gait, no LOB noted. Pt defers all attempts at opening doors or other dynamic tasks, requests PT move objects out of path rather than attempting to negotiate around them   Stairs             Wheelchair Mobility    Modified Rankin (Stroke Patients Only)       Balance Overall balance assessment: Needs assistance Sitting-balance support: No upper extremity supported Sitting balance-Leahy Scale: Normal     Standing balance support: Bilateral upper extremity supported;During functional activity Standing balance-Leahy Scale: Fair Standing balance comment: reliant on BUE support                            Cognition Arousal/Alertness: Awake/alert Behavior During Therapy: WFL for tasks assessed/performed Overall Cognitive Status: Within Functional Limits for tasks assessed                                 General Comments: mod cues for gaze stabilization in functional task but can verbalize what he is suppose to do      Exercises General Exercises - Upper Extremity Shoulder Flexion: AROM;Both;Theraband Theraband Level (Shoulder Flexion): Level 1 (Yellow) Shoulder ABduction: AROM;Both;10 reps;Seated Theraband Level (Shoulder Abduction): Level 1 (Yellow) Elbow Flexion: AROM;Both;10 reps;Seated;Theraband Other Exercises Other Exercises: gaze stabilization    General Comments General comments (skin integrity, edema, etc.): VSS      Pertinent Vitals/Pain Pain Assessment: No/denies pain (nausea during session)    Home Living  Prior Function            PT Goals (current goals can now be found in the care plan section) Acute Rehab PT Goals Patient Stated Goal: to find out what rehab i am going to Progress towards PT goals: Progressing toward goals    Frequency    Min 3X/week      PT Plan Current plan remains appropriate    Co-evaluation              AM-PAC PT "6 Clicks" Mobility    Outcome Measure  Help needed turning from your back to your side while in a flat bed without using bedrails?: A Little Help needed moving from lying on your back to sitting on the side of a flat bed without using bedrails?: A Little Help needed moving to and from a bed to a chair (including a wheelchair)?: A Little Help needed standing up from a chair using your arms (e.g., wheelchair or bedside chair)?: A Little Help needed to walk in hospital room?: A Little Help needed climbing 3-5 steps with a railing? : A Lot 6 Click Score: 17    End of Session   Activity Tolerance: Patient tolerated treatment well Patient left: in chair;with call bell/phone within reach Nurse Communication: Mobility status PT Visit Diagnosis: Unsteadiness on feet (R26.81);Other abnormalities of gait and mobility (R26.89);Muscle weakness (generalized) (M62.81)     Time: 4782-9562 PT Time Calculation (min) (ACUTE ONLY): 19 min  Charges:  $Gait Training: 8-22 mins                     Zenaida Niece, PT, DPT Acute Rehabilitation Pager: [phone]    Zenaida Niece 09/06/2020, 9:31 AM

## 2020-09-10 ENCOUNTER — Encounter: Payer: Self-pay | Admitting: Internal Medicine

## 2020-09-10 ENCOUNTER — Non-Acute Institutional Stay (SKILLED_NURSING_FACILITY): Payer: Medicare Other | Admitting: Internal Medicine

## 2020-09-10 DIAGNOSIS — G919 Hydrocephalus, unspecified: Secondary | ICD-10-CM | POA: Diagnosis not present

## 2020-09-10 DIAGNOSIS — L8990 Pressure ulcer of unspecified site, unspecified stage: Secondary | ICD-10-CM

## 2020-09-10 DIAGNOSIS — R739 Hyperglycemia, unspecified: Secondary | ICD-10-CM | POA: Diagnosis not present

## 2020-09-10 DIAGNOSIS — D496 Neoplasm of unspecified behavior of brain: Secondary | ICD-10-CM | POA: Diagnosis not present

## 2020-09-10 NOTE — Assessment & Plan Note (Signed)
Neurosurgery follow-up was to have been scheduled 1/28 for staple removal. Neurosurgery office will be contacted to verify appointment date and time.

## 2020-09-10 NOTE — Assessment & Plan Note (Signed)
Neurosurgical follow-up tentatively scheduled for 1/28.  This will be verified.

## 2020-09-10 NOTE — Assessment & Plan Note (Addendum)
Wound Care Nurse to monitor at SNF 

## 2020-09-10 NOTE — Progress Notes (Signed)
NURSING HOME LOCATION:  [facility] ROOM NUMBER: [id number]   CODE STATUS: Full Code   PCP:  Cyndi Bender, PA-C  This is a comprehensive admission note to [facility] performed on this date less than 30 days from date of admission. Included are preadmission medical/surgical history; reconciled medication list; family history; social history and comprehensive review of systems.  Corrections and additions to the records were documented. Comprehensive physical exam was also performed. Additionally a clinical summary was entered for each active diagnosis pertinent to this admission in the Problem List to enhance continuity of care.  HPI: He was hospitalized 1/7-1/21/2022 with hydrocephalus due to abnormality of cerebral spinal fluid flow.  He presented to the ED 1/7 with persistent nausea, vomiting, and gait instability in the context of having had suboccipital craniotomy for resection of a large cerebellar mass in October 2021.  Imaging revealed delayed hydrocephalus with increased size of the third and lateral ventricles and new CSF intensity extra-axial collection along the right lateral cerebellar convexity extending to the undersurface of the tentorium resulting in mass-effect including partial effacement of the fourth ventricle. He was admitted to the ICU with subsequent VPS.  He underwent lap-assisted VP shunting on 1/13. Course was essentially uncomplicated.  He did remember on postop day one that he had recently been diagnosed with a UTI and had not been on antibiotics since admission.  Macrobid was restarted and cultures obtained.  He completed full course of antibiotics. He did exhibit significant hyperglycemia while hospitalized. PT/OT recommended SNF placement. Neurosurgical follow-up was to be in 1 week for staple removals.  Past medical and surgical history: Includes essential hypertension, dyslipidemia and GERD.  He has had no other surgeries beyond those related to the  cerebellar mass and subsequent hydrocephalus.  Social history: Currently nondrinker, never smoked.  Family history: Limited history reviewed.   Review of systems: He describes the hydrocephalus as his brain "gathering moisture".  Since the surgery October 22 he describes persistent dizziness, nausea, and vomiting.  He describes anorexia; he describes occasional pill dysphagia.  He states that he has lost 50 pounds since the surgery.  He describes chronic constipation and has BMs only weekly. He also describes blurring of the vision.    Constitutional: No fever Eyes: No redness, discharge, pain ENT/mouth: No nasal congestion, purulent discharge, earache, change in hearing, sore throat  Cardiovascular: No chest pain, palpitations, paroxysmal nocturnal dyspnea, claudication, edema  Respiratory: No cough, sputum production, hemoptysis, DOE, significant snoring, apnea  Gastrointestinal: No heartburn, abdominal pain,rectal bleeding, melena Genitourinary: No dysuria, hematuria, pyuria, incontinence, nocturia Musculoskeletal: No joint stiffness, joint swelling, weakness, pain Dermatologic: No rash, pruritus Neurologic: No  headache, syncope, seizures, numbness, tingling Psychiatric: No significant anxiety, depression, insomnia, anorexia Endocrine: No change in hair/skin/nails, excessive thirst, excessive hunger, excessive urination  Hematologic/lymphatic: No significant bruising, lymphadenopathy, abnormal bleeding Allergy/immunology: No itchy/watery eyes, significant sneezing, urticaria, angioedema  Physical exam:  Pertinent or positive findings: Pattern alopecia is present.  The right craniotomy wound is stapled @ the right crown.  He has a full beard and mustache.  Heart rate is slow.  2 lap scars are present over the abdomen and are described as tender.  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  General appearance: Adequately nourished; no acute distress, increased work of  breathing is present.   Lymphatic: No lymphadenopathy about the head, neck, axilla. Eyes: No conjunctival inflammation or lid edema is present. There is no scleral icterus. Ears:  External ear exam shows  no significant lesions or deformities.   Nose:  External nasal examination shows no deformity or inflammation. Nasal mucosa are pink and moist without lesions, exudates Oral exam: Lips and gums are healthy appearing.There is no oropharyngeal erythema or exudate. Neck:  No thyromegaly, masses, tenderness noted.    Heart:  No gallop, murmur, click, rub.  Lungs: Chest clear to auscultation without wheezes, rhonchi, rales, rubs. Abdomen: Bowel sounds are normal.  Abdomen is soft with no organomegaly, hernias, masses. GU: Deferred  Extremities:  No cyanosis, clubbing, edema. Neurologic exam: Balance, Rhomberg, finger to nose testing could not be completed due to clinical state Skin: Warm & dry w/o tenting. No visible significant lesions or rash.  See clinical summary under each active problem in the Problem List with associated updated therapeutic plan

## 2020-09-10 NOTE — Assessment & Plan Note (Addendum)
Glucoses while hospitalized with hydrocephalus ranged from 122 up to 179.  There is no diagnosis of diabetes in the chart and he is on no diabetic medications.  I could not find documentation that he was on Decadron preop. Hemoglobin A1c will be performed.

## 2020-09-10 NOTE — Patient Instructions (Signed)
See assessment and plan under each diagnosis in the problem list and acutely for this visit 

## 2020-09-12 ENCOUNTER — Non-Acute Institutional Stay (SKILLED_NURSING_FACILITY): Payer: Medicare Other | Admitting: Internal Medicine

## 2020-09-12 ENCOUNTER — Encounter: Payer: Self-pay | Admitting: Internal Medicine

## 2020-09-12 DIAGNOSIS — L8990 Pressure ulcer of unspecified site, unspecified stage: Secondary | ICD-10-CM | POA: Diagnosis not present

## 2020-09-12 DIAGNOSIS — R739 Hyperglycemia, unspecified: Secondary | ICD-10-CM

## 2020-09-12 DIAGNOSIS — G919 Hydrocephalus, unspecified: Secondary | ICD-10-CM | POA: Diagnosis not present

## 2020-09-12 NOTE — Assessment & Plan Note (Addendum)
Over the past 24/48 hours he describes increased pressure in his head at least as severe as that present prior to the VPS placement.  This was associated with multiple bouts of nausea and vomiting through the day 1/26 and voluminous vomitus today.  Neurosurgical office consulted and callback requested by NS on call as follow-up visit is not scheduled until 2/7.  Comprehensive labs will be ordered to rule out chemical or electrolyte imbalance related to the vomiting. Trial of Phenergan ordered to see if this would be more effective than the Zofran.  Zofran increased to 4 mg every 12 hours as needed. If symptoms progress; he will be sent to the ED.

## 2020-09-12 NOTE — Patient Instructions (Signed)
See assessment and plan under each diagnosis in the problem list and acutely for this visit 

## 2020-09-12 NOTE — Assessment & Plan Note (Signed)
A1c ordered for clarification.

## 2020-09-12 NOTE — Progress Notes (Signed)
   NURSING HOME LOCATION:  [facility]  ROOM NUMBER:  [id number]  CODE STATUS: Full code  PCP: Cyndi Bender, PA-C  This is a nursing facility follow up visit for specific acute issue of intractable nausea and vomiting.  Interim medical record and care since last [place] visit was updated with review of diagnostic studies and change in clinical status since last visit were documented.  HPI: The patient reports increased pressure in the last 2 days intracerebrally .He states that it is similar to the sensation he had prior to placement of the VP shunt.  Also his nausea and vomiting was intractable yesterday , reported by staff as "throughout the day".  Previously he had been vomiting only once in the morning for which he was prescribed Zofran 4 mg as needed.  Supposedly his only vomited once today but there is a voluminous amount of vomitus in the emesis basin by his bed.  Review of systems: He gave the correct date but stated that he was confused.  He consistently validated that the intracranial pressure sensation he was feeling was as bad as pre-VPS placement.  He has no new visual symptoms but has persistent dizziness despite meclizine.  Physical exam:  Pertinent or positive findings: He appears uncomfortable but not acutely ill.  There is no definite edematous change at the op site over the right crown.  There is no sign of cellulitis.  Pattern alopecia is present.  He has a beard and mustache.  Extraocular motion is intact but he did have slight nystagmus with either lateral gaze.  Field of vision was intact.  Vision is intact to gross confrontation. Heart rate is slow.  Strength opposition is good in all extremities.  General appearance: Adequately nourished; no acute distress, increased work of breathing is present.   Lymphatic: No lymphadenopathy about the head, neck, axilla. Eyes: No conjunctival inflammation or lid edema is present. There is no  scleral icterus. Ears:  External ear exam shows no significant lesions or deformities.   Nose:  External nasal examination shows no deformity or inflammation. Nasal mucosa are pink and moist without lesions, exudates Oral exam:  Lips and gums are healthy appearing. There is no oropharyngeal erythema or exudate. Neck:  No thyromegaly, masses, tenderness noted.    Heart:  No gallop, murmur, click, rub .  Lungs: Chest clear to auscultation without wheezes, rhonchi, rales, rubs. Abdomen: Bowel sounds are normal. Abdomen is soft and nontender with no organomegaly, hernias, masses. GU: Deferred  Extremities:  No cyanosis, clubbing, edema  Neurologic exam : Cn 2-7 intact Balance, Rhomberg, finger to nose testing could not be completed due to clinical state with severe dizziness. Skin: Warm & dry w/o tenting. No significant lesions or rash.  See summary under each active problem in the Problem List with associated updated therapeutic plan

## 2020-09-12 NOTE — Assessment & Plan Note (Addendum)
SNF Wound Care Nurse validates that the only wounds are the neurosurgical op scar over the right crown and the laparoscopic abdominal scars.  There is no sign of cellulitis at these sites.

## 2020-09-13 LAB — BASIC METABOLIC PANEL
BUN: 10 (ref 4–21)
CO2: 30 — AB (ref 13–22)
Chloride: 102 (ref 99–108)
Creatinine: 0.7 (ref 0.6–1.3)
Glucose: 119
Potassium: 4.4 (ref 3.4–5.3)
Sodium: 141 (ref 137–147)

## 2020-09-13 LAB — HEPATIC FUNCTION PANEL
ALT: 20 (ref 10–40)
AST: 11 — AB (ref 14–40)
Alkaline Phosphatase: 128 — AB (ref 25–125)
Bilirubin, Total: 0.4

## 2020-09-13 LAB — CBC AND DIFFERENTIAL
HCT: 41 (ref 41–53)
Hemoglobin: 14.1 (ref 13.5–17.5)
Neutrophils Absolute: 7.1
Platelets: 316 (ref 150–399)
WBC: 11.1

## 2020-09-13 LAB — CBC: RBC: 4.57 (ref 3.87–5.11)

## 2020-09-13 LAB — COMPREHENSIVE METABOLIC PANEL
Albumin: 3.5 (ref 3.5–5.0)
Calcium: 9 (ref 8.7–10.7)
GFR calc Af Amer: 90
GFR calc non Af Amer: 90
Globulin: 2.4

## 2020-09-13 LAB — HEMOGLOBIN A1C: Hemoglobin A1C: 6.8

## 2020-09-17 ENCOUNTER — Non-Acute Institutional Stay (SKILLED_NURSING_FACILITY): Payer: Medicare Other | Admitting: Adult Health

## 2020-09-17 ENCOUNTER — Encounter: Payer: Self-pay | Admitting: Adult Health

## 2020-09-17 DIAGNOSIS — R42 Dizziness and giddiness: Secondary | ICD-10-CM | POA: Diagnosis not present

## 2020-09-17 DIAGNOSIS — F5101 Primary insomnia: Secondary | ICD-10-CM

## 2020-09-17 DIAGNOSIS — G919 Hydrocephalus, unspecified: Secondary | ICD-10-CM

## 2020-09-17 NOTE — Progress Notes (Signed)
Location:  [facility] Room Number: 222-A Place of Service:  SNF (31) Provider:  Durenda Age, DNP, FNP-BC  Patient Care Team: Fae Pippin as PCP - General (Physician Assistant)  Extended Emergency Contact Information Primary Emergency Contact: [place] Phone: [phone] Relation: Brother  Code Status:  FULL CODE  Goals of care: Advanced Directive information Advanced Directives 09/10/2020  Does Patient Have a Medical Advance Directive? No  Would patient like information on creating a medical advance directive? No - Patient declined     Chief Complaint  Patient presents with  . Medical Management of Chronic Issues    Patient is seen for a short-term rehabilitation visit.     HPI:  Pt is a 71 y.o. male seen today for a routine short-term rehabilitation visit. He has a PMH of essential hypertension, dyslipidemia and GERD. He was seen in his room with OT and ST. He complained of difficulty sleeping. He, also, complained of dizziness  during therapy. SBPs ranging from  111 to 120. He takes metoprolol tartrate 50 mg twice daily for hypertension.  He was admitted to [facility] on 09/06/20 post hospitalization 08/23/20 to 09/06/20. He presented to the ED for persistent nausea, vomiting and gait instability after undergoing suboccipital craniotomy for resection of large cerebellar mass in October 2021. Imaging revealed delayed hydrocephalus. He was admitted to the ICU and subsequent VPS. He underwent lap-assisted VP shunting on 1/13. On post op day 1, he remembered that he was recently diagnosed with a UTI and had not been on antibiotics since admission. Macrobid was restarted and cultures obtained. He completed full course of antibiotics.   Past Medical History:  Diagnosis Date  . GERD (gastroesophageal reflux disease)   . Hypertension    Past Surgical History:  Procedure Laterality Date  . CRANIOTOMY N/A  06/07/2020   Procedure: SUBOCCIPITAL CRANIECTOMY FOR RESECTION OF CEREBELLAR TUMOR;  Surgeon: Consuella Lose, MD;  Location: [place];  Service: Neurosurgery;  Laterality: N/A;  SUBOCCIPITAL CRANIECTOMY FOR RESECTION OF CEREBELLAR TUMOR  . LAPAROSCOPIC REVISION VENTRICULAR-PERITONEAL (V-P) SHUNT N/A 08/29/2020   Procedure: LAPAROSCOPIC REVISION VENTRICULAR-PERITONEAL (V-P) SHUNT;  Surgeon: Dwan Bolt, MD;  Location: [place];  Service: General;  Laterality: N/A;  . VENTRICULOPERITONEAL SHUNT N/A 08/29/2020   Procedure: SHUNT INSERTION VENTRICULAR-PERITONEAL;  Surgeon: Consuella Lose, MD;  Location: [facility];  Service: Neurosurgery;  Laterality: N/A;    No Known Allergies  Outpatient Encounter Medications as of 09/17/2020  Medication Sig  . meclizine (ANTIVERT) 25 MG tablet Take 25 mg by mouth 2 (two) times daily as needed for dizziness or nausea.  . metoprolol tartrate (LOPRESSOR) 50 MG tablet Take 50 mg by mouth 2 (two) times daily.   . Multiple Vitamin (MULTIVITAMIN WITH MINERALS) TABS tablet Take 1 tablet by mouth daily.  . Omega-3 Fatty Acids (FISH OIL) 1000 MG CAPS Take 2,000 mg by mouth in the morning and at bedtime.  . ondansetron (ZOFRAN) 4 MG tablet Take 1 tablet (4 mg total) by mouth daily as needed for nausea or vomiting.  . pantoprazole (PROTONIX) 40 MG tablet Take 40 mg by mouth daily.  . simvastatin (ZOCOR) 40 MG tablet Take 40 mg by mouth at bedtime.  . [DISCONTINUED] traZODone (DESYREL) 150 MG tablet Take 150 mg by mouth at bedtime.   No facility-administered encounter medications on file as of 09/17/2020.    Review of Systems  GENERAL: No fever or chills  MOUTH and THROAT: Denies oral discomfort, gingival pain or bleeding  RESPIRATORY: no cough, SOB, DOE, wheezing, hemoptysis CARDIAC: No chest pain, edema or palpitations GI: has nausea and vomiting GU: Denies dysuria, frequency, hematuria, incontinence, or discharge NEUROLOGICAL: Has dizziness, denies  headache PSYCHIATRIC: Denies feelings of depression or anxiety. No report of hallucinations, insomnia, paranoia, or agitation    There is no immunization history on file for this patient. Pertinent  Health Maintenance Due  Topic Date Due  . URINE MICROALBUMIN  Never done  . COLONOSCOPY (Pts 45-17yrs Insurance coverage will need to be confirmed)  Never done  . PNA vac Low Risk Adult (1 of 2 - PCV13) Never done  . INFLUENZA VACCINE  Completed   Fall Risk  03/17/2018  Falls in the past year? No  Comment Emmi Telephone Survey: data to providers prior to load     Vitals:   09/17/20 0942  Weight: 165 lb 6.4 oz (75 kg)  Height: 5\' 8"  (1.727 m)   Body mass index is 25.15 kg/m.  Physical Exam  GENERAL APPEARANCE: Well nourished. In no acute distress. Normal body habitus SKIN:  Left frontal scalp shunt site with intact staples, no erythema. MOUTH and THROAT: Lips are without lesions. Oral mucosa is moist and without lesions. Tongue is normal in shape, size, and color and without lesions RESPIRATORY: Breathing is even & unlabored, BS CTAB CARDIAC: RRR, no murmur,no extra heart sounds, no edema GI: Abdomen soft, normal BS, no masses, no tenderness NEUROLOGICAL: There is no tremor. Speech is clear. Alert and oriented X 3. PSYCHIATRIC:  Affect and behavior are appropriate  Labs reviewed: Recent Labs    08/23/20 2250 08/28/20 1659 08/30/20 0340  NA 135 134* 138  K 3.9 4.2 4.0  CL 96* 95* 98  CO2 29 27 28   GLUCOSE 169* 152* 179*  BUN 6* 9 15  CREATININE 0.59* 0.62 0.76  CALCIUM 8.9 9.1 9.3   Recent Labs    08/23/20 2250  AST 34  ALT 50*  ALKPHOS 107  BILITOT 0.7  PROT 6.2*  ALBUMIN 3.0*   Recent Labs    08/23/20 2250 08/28/20 1659 08/30/20 0340  WBC 11.8* 11.3* 15.5*  NEUTROABS 8.6*  --   --   HGB 15.0 15.4 15.3  HCT 41.9 44.3 44.0  MCV 89.1 91.0 90.9  PLT 313 342 395    Significant Diagnostic Results in last 30 days:  MR BRAIN W WO CONTRAST  Result Date:  08/25/2020 CLINICAL DATA:  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 CONTRAST:  7.29mL GADAVIST GADOBUTROL 1 MMOL/ML IV SOLN COMPARISON:  06/07/2020 FINDINGS: Brain: Postoperative changes are identified with contraction of hemosiderin lined left cerebellar resection cavity extending superiorly into the vermis where there is a nodular focus of enhancement now measuring 6 x 5 mm (previously 8 x 7 mm). Increased thin enhancement along the resection cavity margins. Adjacent T2 FLAIR hyperintensity likely reflects gliosis. There is a CSF intensity extra-axial collection along the right lateral cerebellar convexity extending superiorly to the undersurface of the tentorium with mass effect on the adjacent cerebellum including slight ascending transtentorial herniation. There is also partial effacement of the fourth ventricle. Third and lateral ventricle caliber has increased suggesting a degree of hydrocephalus. No acute infarction. Persistent small foci of fat within the frontal horns. Probable residual chronic blood products in the occipital horns. Foci of susceptibility along the medial right thalamus, lateral  right thalamus, right putamen and right subinsular white matter likely reflect foci of chronic hemorrhage. There is likely scattered sulcal susceptibility reflecting superficial siderosis. Vascular: Major vessel flow voids at the skull base are preserved. Skull and upper cervical spine: Suboccipital craniectomy. Normal marrow signal is preserved. Sinuses/Orbits: Paranasal sinus mucosal thickening. Orbits are unremarkable. Other: There is a fluid collection in the suboccipital scalp measuring approximately 5.3 x 6.2 x 4.1 cm extending dorsal to the posterior arch of C1. 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 Sella is unremarkable. Mastoid air cells are clear. IMPRESSION: Contraction of left cerebellar resection cavity with increased thin enhancement along the margins. Persistent but decreased size of nodular enhancement at the deep margin. Continued attention on follow-up. New CSF intensity extra-axial collection along the right lateral cerebellar convexity extending to the undersurface of the tentorium. Resulting mass effect including partial effacement the fourth ventricle. Increased size of third and lateral ventricles suggesting hydrocephalus. 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 May communicate with above extra-axial collection. Persistent small foci of fat within the frontal horns, presumably postoperative. Electronically Signed   By: Macy Mis M.D.   On: 08/25/2020 14:22   DG Chest Port 1 View  Result Date: 08/23/2020 CLINICAL DATA:  Chest pain of uncertain etiology. EXAM: PORTABLE CHEST 1 VIEW COMPARISON:  None. FINDINGS: The heart size and mediastinal contours are within normal limits. Both lungs are clear. The visualized skeletal structures are unremarkable. IMPRESSION: No active disease. Electronically Signed   By: Dorise Bullion III M.D   On: 08/23/2020 19:14    Assessment/Plan  1. Primary insomnia -  Will start on Melatonin 5 mg at bedtime  2. Dizziness -  Probably due to orthostatic hypotension, will start on TED stockings, knee high, on in am and off at bedtime  3. 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  Labs/tests ordered: None  Goals of care:   Short-term care   Durenda Age, DNP, MSN, FNP-BC [facility] [phone] (Monday-Friday 8:00 a.m. - 5:00 p.m.) [phone] (after  hours)

## 2020-09-24 ENCOUNTER — Encounter: Payer: Self-pay | Admitting: Adult Health

## 2020-09-24 ENCOUNTER — Non-Acute Institutional Stay (SKILLED_NURSING_FACILITY): Payer: Medicare Other | Admitting: Adult Health

## 2020-09-24 DIAGNOSIS — R110 Nausea: Secondary | ICD-10-CM

## 2020-09-24 DIAGNOSIS — R42 Dizziness and giddiness: Secondary | ICD-10-CM | POA: Diagnosis not present

## 2020-09-24 DIAGNOSIS — G919 Hydrocephalus, unspecified: Secondary | ICD-10-CM | POA: Diagnosis not present

## 2020-09-24 DIAGNOSIS — E785 Hyperlipidemia, unspecified: Secondary | ICD-10-CM | POA: Diagnosis not present

## 2020-09-24 DIAGNOSIS — I10 Essential (primary) hypertension: Secondary | ICD-10-CM

## 2020-09-24 NOTE — Progress Notes (Signed)
Location:  [facility] Room Number: 222-A Place of Service:  SNF (31) Provider:  Durenda Age, DNP, FNP-BC  Patient Care Team: Fae Pippin as PCP - General (Physician Assistant)  Extended Emergency Contact Information Primary Emergency Contact: [place] Phone: [phone] Relation: Brother  Code Status:  FULL CODE  Goals of care: Advanced Directive information Advanced Directives 09/10/2020  Does Patient Have a Medical Advance Directive? No  Would patient like information on creating a medical advance directive? No - Patient declined     Chief Complaint  Patient presents with  . Discharge Note    Patient is seen for discharge from SNF on 09/26/20    HPI:  Pt is a 70 y.o. male seen today for discharge to home on 09/26/2020 with home health PT and OT.  He was admitted to [facility] on 09/06/2020 post hospitalization 08/23/20 to 09/06/2020. He presented to the ED for persistent nausea, vomiting and gait instability after undergoing suboccipital craniotomy for resection of large cerebellar mass in October 2021. Imaging revealed delayed hydrocephalus. He was admitted to the ICU and subsequent VPS. He underwent lap-assisted VP shunting on 1/13. On postop day 1, he remembered that he was recently diagnosed with a UTI and had not been on antibiotics since admission to the hospital. Macrobid was restarted and cultures obtained. He completed full course of antibiotics. He has a PMH of essential hypertension, dyslipidemia and GERD.  He was seen in his room today. Right frontal scalp VP shuntl site is dry, no redness. Staples were already removed. He denies having dizziness nor nausea.  Patient was admitted to this facility for short-term rehabilitation after the patient's recent hospitalization.  Patient has completed SNF rehabilitation and therapy has cleared the patient for discharge.   Past Medical History:  Diagnosis  Date  . GERD (gastroesophageal reflux disease)   . Hypertension    Past Surgical History:  Procedure Laterality Date  . CRANIOTOMY N/A 06/07/2020   Procedure: SUBOCCIPITAL CRANIECTOMY FOR RESECTION OF CEREBELLAR TUMOR;  Surgeon: Consuella Lose, MD;  Location: [facility];  Service: Neurosurgery;  Laterality: N/A;  SUBOCCIPITAL CRANIECTOMY FOR RESECTION OF CEREBELLAR TUMOR  . LAPAROSCOPIC REVISION VENTRICULAR-PERITONEAL (V-P) SHUNT N/A 08/29/2020   Procedure: LAPAROSCOPIC REVISION VENTRICULAR-PERITONEAL (V-P) SHUNT;  Surgeon: Dwan Bolt, MD;  Location: [place];  Service: General;  Laterality: N/A;  . VENTRICULOPERITONEAL SHUNT N/A 08/29/2020   Procedure: SHUNT INSERTION VENTRICULAR-PERITONEAL;  Surgeon: Consuella Lose, MD;  Location: [place];  Service: Neurosurgery;  Laterality: N/A;    No Known Allergies  Outpatient Encounter Medications as of 09/24/2020  Medication Sig  . docusate sodium (COLACE) 100 MG capsule Take 100 mg by mouth.  . meclizine (ANTIVERT) 25 MG tablet Take 25 mg by mouth 2 (two) times daily as needed for dizziness or nausea.  . melatonin 5 MG TABS Take 5 mg by mouth at bedtime.  . metoprolol tartrate (LOPRESSOR) 50 MG tablet Take 50 mg by mouth 2 (two) times daily.   . Multiple Vitamin (MULTIVITAMIN WITH MINERALS) TABS tablet Take 1 tablet by mouth daily.  . Omega-3 Fatty Acids (FISH OIL) 1000 MG CAPS Take 2,000 mg by mouth in the morning and at bedtime.  . ondansetron (ZOFRAN) 4 MG tablet Take 1 tablet (4 mg total) by mouth daily as needed for nausea or vomiting.  . simvastatin (ZOCOR) 40 MG tablet Take 40 mg by mouth at bedtime.   No facility-administered encounter medications on file as of 09/24/2020.  Review of Systems  GENERAL: No change in appetite, no fatigue, no weight changes, no fever, chills or weakness MOUTH and THROAT: Denies oral discomfort, gingival pain or bleeding RESPIRATORY: no cough, SOB, DOE, wheezing, hemoptysis CARDIAC: No chest pain, edema  or palpitations GI: No abdominal pain, diarrhea, constipation, heart burn, nausea or vomiting GU: Denies dysuria, frequency, hematuria, incontinence, or discharge NEUROLOGICAL: Denies dizziness, syncope, numbness, or headache PSYCHIATRIC: Denies feelings of depression or anxiety. No report of hallucinations, insomnia, paranoia, or agitation    There is no immunization history on file for this patient. Pertinent  Health Maintenance Due  Topic Date Due  . URINE MICROALBUMIN  Never done  . COLONOSCOPY (Pts 45-68yrs Insurance coverage will need to be confirmed)  Never done  . PNA vac Low Risk Adult (1 of 2 - PCV13) Never done  . INFLUENZA VACCINE  Completed   Fall Risk  03/17/2018  Falls in the past year? No  Comment Emmi Telephone Survey: data to providers prior to load     Vitals:   09/24/20 0947  BP: 128/80  Pulse: 74  Resp: 18  Temp: 97.9 F (36.6 C)  TempSrc: Oral  Weight: 168 lb 12.8 oz (76.6 kg)  Height: 5\' 8"  (1.727 m)   Body mass index is 25.67 kg/m.  Physical Exam  GENERAL APPEARANCE: Well nourished. In no acute distress. Normal body habitus SKIN:  Right frontal scalp with VP shunt, surgical site no erythema, dry.  MOUTH and THROAT: Lips are without lesions. Oral mucosa is moist and without lesions.  RESPIRATORY: Breathing is even & unlabored, BS CTAB CARDIAC: RRR, no murmur,no extra heart sounds, no edema GI: Abdomen soft, normal BS, no masses, no tenderness EXTREMITIES: Able to move x4 extremities NEUROLOGICAL: There is no tremor. Speech is clear. Alert and oriented X 3.  PSYCHIATRIC: Affect and behavior are appropriate  Labs reviewed: Recent Labs    08/23/20 2250 08/28/20 1659 08/30/20 0340 09/13/20 0000  NA 135 134* 138 141  K 3.9 4.2 4.0 4.4  CL 96* 95* 98 102  CO2 29 27 28  30*  GLUCOSE 169* 152* 179*  --   BUN 6* 9 15 10   CREATININE 0.59* 0.62 0.76 0.7  CALCIUM 8.9 9.1 9.3 9.0   Recent Labs    08/23/20 2250 09/13/20 0000  AST 34 11*  ALT  50* 20  ALKPHOS 107 128*  BILITOT 0.7  --   PROT 6.2*  --   ALBUMIN 3.0* 3.5   Recent Labs    08/23/20 2250 08/28/20 1659 08/30/20 0340 09/13/20 0000  WBC 11.8* 11.3* 15.5* 11.1  NEUTROABS 8.6*  --   --  7.10  HGB 15.0 15.4 15.3 14.1  HCT 41.9 44.3 44.0 41  MCV 89.1 91.0 90.9  --   PLT 313 342 395 316   No results found for: TSH Lab Results  Component Value Date   HGBA1C 6.8 09/13/2020   No results found for: CHOL, HDL, LDLCALC, LDLDIRECT, TRIG, CHOLHDL  Significant Diagnostic Results in last 30 days:  MR BRAIN W WO CONTRAST  Result Date: 08/25/2020 CLINICAL DATA:  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 CONTRAST:  7.26mL GADAVIST GADOBUTROL 1 MMOL/ML IV SOLN COMPARISON:  06/07/2020 FINDINGS: Brain: Postoperative changes are identified with contraction of hemosiderin lined left cerebellar resection cavity extending superiorly into the vermis where there is  a nodular focus of enhancement now measuring 6 x 5 mm (previously 8 x 7 mm). Increased thin enhancement along the resection cavity margins. Adjacent T2 FLAIR hyperintensity likely reflects gliosis. There is a CSF intensity extra-axial collection along the right lateral cerebellar convexity extending superiorly to the undersurface of the tentorium with mass effect on the adjacent cerebellum including slight ascending transtentorial herniation. There is also partial effacement of the fourth ventricle. Third and lateral ventricle caliber has increased suggesting a degree of hydrocephalus. No acute infarction. Persistent small foci of fat within the frontal horns. Probable residual chronic blood products in the occipital horns. Foci of susceptibility along the medial right thalamus, lateral right thalamus, right putamen and  right subinsular white matter likely reflect foci of chronic hemorrhage. There is likely scattered sulcal susceptibility reflecting superficial siderosis. Vascular: Major vessel flow voids at the skull base are preserved. Skull and upper cervical spine: Suboccipital craniectomy. Normal marrow signal is preserved. Sinuses/Orbits: Paranasal sinus mucosal thickening. Orbits are unremarkable. Other: There is a fluid collection in the suboccipital scalp measuring approximately 5.3 x 6.2 x 4.1 cm extending dorsal to the posterior arch of C1. 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 Sella is unremarkable. Mastoid air cells are clear. IMPRESSION: Contraction of left cerebellar resection cavity with increased thin enhancement along the margins. Persistent but decreased size of nodular enhancement at the deep margin. Continued attention on follow-up. New CSF intensity extra-axial collection along the right lateral cerebellar convexity extending to the undersurface of the tentorium. Resulting mass effect including partial effacement the fourth ventricle. Increased size of third and lateral ventricles suggesting hydrocephalus. 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 May communicate with above extra-axial collection. Persistent small foci of fat within the frontal horns, presumably postoperative. Electronically Signed   By: Macy Mis M.D.   On: 08/25/2020 14:22    Assessment/Plan  1. 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 Consuella Lose, neurosurgery  2. Dizziness - meclizine (ANTIVERT) 25 MG tablet; Take 1 tablet (25 mg total) by mouth 2 (two) times daily as needed for dizziness or nausea.  Dispense: 60 tablet; Refill: 0  3. Essential hypertension - metoprolol tartrate (LOPRESSOR) 50 MG tablet; Take 1 tablet (50 mg total) by mouth 2 (two) times  daily.  Dispense: 60 tablet; Refill: 0  4. Dyslipidemia - simvastatin (ZOCOR) 40 MG tablet; Take 1 tablet (40 mg total) by mouth at bedtime.  Dispense: 30 tablet; Refill: 0  5. Nausea - ondansetron (ZOFRAN) 4 MG tablet; Take 1 tablet (4 mg total) by mouth daily as needed for nausea or vomiting.  Dispense: 30 tablet; Refill: 0      I have filled out patient's discharge paperwork and e-prescribed medications.  Patient will have home health PT and OT.  DME provided:   None  Total discharge time: Greater than 30 minutes Greater than 50% was spent in counseling and coordination of care.   Discharge time involved coordination of the discharge process with social worker, nursing staff and therapy department. Medical justification for home health services verified.   Durenda Age, DNP, MSN, FNP-BC [facility] [phone] (Monday-Friday 8:00 a.m. - 5:00 p.m.) [phone] (after hours)

## 2020-09-25 MED ORDER — METOPROLOL TARTRATE 50 MG PO TABS: 50.0000 mg | ORAL_TABLET | Freq: Two times a day (BID) | ORAL | 0 refills | Status: AC

## 2020-09-25 MED ORDER — ONDANSETRON HCL 4 MG PO TABS: 4.0000 mg | ORAL_TABLET | Freq: Every day | ORAL | 0 refills | Status: AC | PRN

## 2020-09-25 MED ORDER — SIMVASTATIN 40 MG PO TABS: 40.0000 mg | ORAL_TABLET | Freq: Every day | ORAL | 0 refills | Status: AC

## 2020-09-25 MED ORDER — MECLIZINE HCL 25 MG PO TABS: 25.0000 mg | ORAL_TABLET | Freq: Two times a day (BID) | ORAL | 0 refills | Status: AC | PRN

## 2020-10-02 DIAGNOSIS — Z6827 Body mass index (BMI) 27.0-27.9, adult: Secondary | ICD-10-CM | POA: Diagnosis not present

## 2020-10-02 DIAGNOSIS — D481 Neoplasm of uncertain behavior of connective and other soft tissue: Secondary | ICD-10-CM | POA: Diagnosis not present

## 2020-10-02 DIAGNOSIS — E782 Mixed hyperlipidemia: Secondary | ICD-10-CM | POA: Diagnosis not present

## 2020-10-02 DIAGNOSIS — Z79899 Other long term (current) drug therapy: Secondary | ICD-10-CM | POA: Diagnosis not present

## 2020-10-02 DIAGNOSIS — G919 Hydrocephalus, unspecified: Secondary | ICD-10-CM | POA: Diagnosis not present

## 2020-10-02 DIAGNOSIS — M546 Pain in thoracic spine: Secondary | ICD-10-CM | POA: Diagnosis not present

## 2020-10-02 DIAGNOSIS — N390 Urinary tract infection, site not specified: Secondary | ICD-10-CM | POA: Diagnosis not present

## 2020-10-02 DIAGNOSIS — I10 Essential (primary) hypertension: Secondary | ICD-10-CM | POA: Diagnosis not present

## 2020-10-12 DIAGNOSIS — Z982 Presence of cerebrospinal fluid drainage device: Secondary | ICD-10-CM | POA: Diagnosis not present

## 2020-10-12 DIAGNOSIS — M1812 Unilateral primary osteoarthritis of first carpometacarpal joint, left hand: Secondary | ICD-10-CM | POA: Diagnosis not present

## 2020-10-12 DIAGNOSIS — G918 Other hydrocephalus: Secondary | ICD-10-CM | POA: Diagnosis not present

## 2020-10-12 DIAGNOSIS — E782 Mixed hyperlipidemia: Secondary | ICD-10-CM | POA: Diagnosis not present

## 2020-10-12 DIAGNOSIS — E119 Type 2 diabetes mellitus without complications: Secondary | ICD-10-CM | POA: Diagnosis not present

## 2020-10-12 DIAGNOSIS — R32 Unspecified urinary incontinence: Secondary | ICD-10-CM | POA: Diagnosis not present

## 2020-10-12 DIAGNOSIS — D481 Neoplasm of uncertain behavior of connective and other soft tissue: Secondary | ICD-10-CM | POA: Diagnosis not present

## 2020-10-12 DIAGNOSIS — Z8673 Personal history of transient ischemic attack (TIA), and cerebral infarction without residual deficits: Secondary | ICD-10-CM | POA: Diagnosis not present

## 2020-10-12 DIAGNOSIS — Z9181 History of falling: Secondary | ICD-10-CM | POA: Diagnosis not present

## 2020-10-12 DIAGNOSIS — K219 Gastro-esophageal reflux disease without esophagitis: Secondary | ICD-10-CM | POA: Diagnosis not present

## 2020-10-12 DIAGNOSIS — F5101 Primary insomnia: Secondary | ICD-10-CM | POA: Diagnosis not present

## 2020-10-12 DIAGNOSIS — I10 Essential (primary) hypertension: Secondary | ICD-10-CM | POA: Diagnosis not present

## 2020-10-12 DIAGNOSIS — Z87440 Personal history of urinary (tract) infections: Secondary | ICD-10-CM | POA: Diagnosis not present

## 2020-10-12 DIAGNOSIS — M546 Pain in thoracic spine: Secondary | ICD-10-CM | POA: Diagnosis not present

## 2020-10-16 DIAGNOSIS — K219 Gastro-esophageal reflux disease without esophagitis: Secondary | ICD-10-CM | POA: Diagnosis not present

## 2020-10-16 DIAGNOSIS — I10 Essential (primary) hypertension: Secondary | ICD-10-CM | POA: Diagnosis not present

## 2020-10-16 DIAGNOSIS — E782 Mixed hyperlipidemia: Secondary | ICD-10-CM | POA: Diagnosis not present

## 2020-10-16 DIAGNOSIS — E119 Type 2 diabetes mellitus without complications: Secondary | ICD-10-CM | POA: Diagnosis not present

## 2020-10-16 DIAGNOSIS — G918 Other hydrocephalus: Secondary | ICD-10-CM | POA: Diagnosis not present

## 2020-10-16 DIAGNOSIS — D481 Neoplasm of uncertain behavior of connective and other soft tissue: Secondary | ICD-10-CM | POA: Diagnosis not present

## 2020-10-17 DIAGNOSIS — G918 Other hydrocephalus: Secondary | ICD-10-CM | POA: Diagnosis not present

## 2020-10-17 DIAGNOSIS — K219 Gastro-esophageal reflux disease without esophagitis: Secondary | ICD-10-CM | POA: Diagnosis not present

## 2020-10-17 DIAGNOSIS — E782 Mixed hyperlipidemia: Secondary | ICD-10-CM | POA: Diagnosis not present

## 2020-10-17 DIAGNOSIS — I10 Essential (primary) hypertension: Secondary | ICD-10-CM | POA: Diagnosis not present

## 2020-10-17 DIAGNOSIS — E119 Type 2 diabetes mellitus without complications: Secondary | ICD-10-CM | POA: Diagnosis not present

## 2020-10-17 DIAGNOSIS — D481 Neoplasm of uncertain behavior of connective and other soft tissue: Secondary | ICD-10-CM | POA: Diagnosis not present

## 2020-10-21 DIAGNOSIS — K219 Gastro-esophageal reflux disease without esophagitis: Secondary | ICD-10-CM | POA: Diagnosis not present

## 2020-10-21 DIAGNOSIS — I10 Essential (primary) hypertension: Secondary | ICD-10-CM | POA: Diagnosis not present

## 2020-10-21 DIAGNOSIS — E119 Type 2 diabetes mellitus without complications: Secondary | ICD-10-CM | POA: Diagnosis not present

## 2020-10-21 DIAGNOSIS — G918 Other hydrocephalus: Secondary | ICD-10-CM | POA: Diagnosis not present

## 2020-10-21 DIAGNOSIS — D481 Neoplasm of uncertain behavior of connective and other soft tissue: Secondary | ICD-10-CM | POA: Diagnosis not present

## 2020-10-21 DIAGNOSIS — E782 Mixed hyperlipidemia: Secondary | ICD-10-CM | POA: Diagnosis not present

## 2020-10-24 DIAGNOSIS — E782 Mixed hyperlipidemia: Secondary | ICD-10-CM | POA: Diagnosis not present

## 2020-10-24 DIAGNOSIS — I10 Essential (primary) hypertension: Secondary | ICD-10-CM | POA: Diagnosis not present

## 2020-10-24 DIAGNOSIS — D481 Neoplasm of uncertain behavior of connective and other soft tissue: Secondary | ICD-10-CM | POA: Diagnosis not present

## 2020-10-24 DIAGNOSIS — G918 Other hydrocephalus: Secondary | ICD-10-CM | POA: Diagnosis not present

## 2020-10-24 DIAGNOSIS — E119 Type 2 diabetes mellitus without complications: Secondary | ICD-10-CM | POA: Diagnosis not present

## 2020-10-24 DIAGNOSIS — K219 Gastro-esophageal reflux disease without esophagitis: Secondary | ICD-10-CM | POA: Diagnosis not present

## 2020-10-28 DIAGNOSIS — G918 Other hydrocephalus: Secondary | ICD-10-CM | POA: Diagnosis not present

## 2020-10-28 DIAGNOSIS — K219 Gastro-esophageal reflux disease without esophagitis: Secondary | ICD-10-CM | POA: Diagnosis not present

## 2020-10-28 DIAGNOSIS — E119 Type 2 diabetes mellitus without complications: Secondary | ICD-10-CM | POA: Diagnosis not present

## 2020-10-28 DIAGNOSIS — E782 Mixed hyperlipidemia: Secondary | ICD-10-CM | POA: Diagnosis not present

## 2020-10-28 DIAGNOSIS — D481 Neoplasm of uncertain behavior of connective and other soft tissue: Secondary | ICD-10-CM | POA: Diagnosis not present

## 2020-10-28 DIAGNOSIS — I10 Essential (primary) hypertension: Secondary | ICD-10-CM | POA: Diagnosis not present

## 2020-10-29 DIAGNOSIS — E119 Type 2 diabetes mellitus without complications: Secondary | ICD-10-CM | POA: Diagnosis not present

## 2020-10-29 DIAGNOSIS — G918 Other hydrocephalus: Secondary | ICD-10-CM | POA: Diagnosis not present

## 2020-10-29 DIAGNOSIS — I10 Essential (primary) hypertension: Secondary | ICD-10-CM | POA: Diagnosis not present

## 2020-10-29 DIAGNOSIS — E782 Mixed hyperlipidemia: Secondary | ICD-10-CM | POA: Diagnosis not present

## 2020-10-29 DIAGNOSIS — K219 Gastro-esophageal reflux disease without esophagitis: Secondary | ICD-10-CM | POA: Diagnosis not present

## 2020-10-29 DIAGNOSIS — D481 Neoplasm of uncertain behavior of connective and other soft tissue: Secondary | ICD-10-CM | POA: Diagnosis not present

## 2020-10-30 ENCOUNTER — Other Ambulatory Visit: Payer: Self-pay | Admitting: Neurosurgery

## 2020-10-30 DIAGNOSIS — D432 Neoplasm of uncertain behavior of brain, unspecified: Secondary | ICD-10-CM

## 2020-11-01 DIAGNOSIS — Z6827 Body mass index (BMI) 27.0-27.9, adult: Secondary | ICD-10-CM | POA: Diagnosis not present

## 2020-11-01 DIAGNOSIS — N390 Urinary tract infection, site not specified: Secondary | ICD-10-CM | POA: Diagnosis not present

## 2020-11-01 DIAGNOSIS — M546 Pain in thoracic spine: Secondary | ICD-10-CM | POA: Diagnosis not present

## 2020-11-05 DIAGNOSIS — E782 Mixed hyperlipidemia: Secondary | ICD-10-CM | POA: Diagnosis not present

## 2020-11-05 DIAGNOSIS — K219 Gastro-esophageal reflux disease without esophagitis: Secondary | ICD-10-CM | POA: Diagnosis not present

## 2020-11-05 DIAGNOSIS — G918 Other hydrocephalus: Secondary | ICD-10-CM | POA: Diagnosis not present

## 2020-11-05 DIAGNOSIS — D481 Neoplasm of uncertain behavior of connective and other soft tissue: Secondary | ICD-10-CM | POA: Diagnosis not present

## 2020-11-05 DIAGNOSIS — I10 Essential (primary) hypertension: Secondary | ICD-10-CM | POA: Diagnosis not present

## 2020-11-05 DIAGNOSIS — E119 Type 2 diabetes mellitus without complications: Secondary | ICD-10-CM | POA: Diagnosis not present

## 2020-11-06 DIAGNOSIS — D481 Neoplasm of uncertain behavior of connective and other soft tissue: Secondary | ICD-10-CM | POA: Diagnosis not present

## 2020-11-06 DIAGNOSIS — E782 Mixed hyperlipidemia: Secondary | ICD-10-CM | POA: Diagnosis not present

## 2020-11-06 DIAGNOSIS — G918 Other hydrocephalus: Secondary | ICD-10-CM | POA: Diagnosis not present

## 2020-11-06 DIAGNOSIS — E119 Type 2 diabetes mellitus without complications: Secondary | ICD-10-CM | POA: Diagnosis not present

## 2020-11-06 DIAGNOSIS — K219 Gastro-esophageal reflux disease without esophagitis: Secondary | ICD-10-CM | POA: Diagnosis not present

## 2020-11-06 DIAGNOSIS — I10 Essential (primary) hypertension: Secondary | ICD-10-CM | POA: Diagnosis not present

## 2020-11-11 DIAGNOSIS — I10 Essential (primary) hypertension: Secondary | ICD-10-CM | POA: Diagnosis not present

## 2020-11-11 DIAGNOSIS — Z982 Presence of cerebrospinal fluid drainage device: Secondary | ICD-10-CM | POA: Diagnosis not present

## 2020-11-11 DIAGNOSIS — D481 Neoplasm of uncertain behavior of connective and other soft tissue: Secondary | ICD-10-CM | POA: Diagnosis not present

## 2020-11-11 DIAGNOSIS — G918 Other hydrocephalus: Secondary | ICD-10-CM | POA: Diagnosis not present

## 2020-11-11 DIAGNOSIS — M546 Pain in thoracic spine: Secondary | ICD-10-CM | POA: Diagnosis not present

## 2020-11-11 DIAGNOSIS — Z9181 History of falling: Secondary | ICD-10-CM | POA: Diagnosis not present

## 2020-11-11 DIAGNOSIS — K219 Gastro-esophageal reflux disease without esophagitis: Secondary | ICD-10-CM | POA: Diagnosis not present

## 2020-11-11 DIAGNOSIS — F5101 Primary insomnia: Secondary | ICD-10-CM | POA: Diagnosis not present

## 2020-11-11 DIAGNOSIS — R32 Unspecified urinary incontinence: Secondary | ICD-10-CM | POA: Diagnosis not present

## 2020-11-11 DIAGNOSIS — Z87440 Personal history of urinary (tract) infections: Secondary | ICD-10-CM | POA: Diagnosis not present

## 2020-11-11 DIAGNOSIS — M1812 Unilateral primary osteoarthritis of first carpometacarpal joint, left hand: Secondary | ICD-10-CM | POA: Diagnosis not present

## 2020-11-11 DIAGNOSIS — E119 Type 2 diabetes mellitus without complications: Secondary | ICD-10-CM | POA: Diagnosis not present

## 2020-11-11 DIAGNOSIS — Z8673 Personal history of transient ischemic attack (TIA), and cerebral infarction without residual deficits: Secondary | ICD-10-CM | POA: Diagnosis not present

## 2020-11-11 DIAGNOSIS — E782 Mixed hyperlipidemia: Secondary | ICD-10-CM | POA: Diagnosis not present

## 2020-11-12 DIAGNOSIS — D481 Neoplasm of uncertain behavior of connective and other soft tissue: Secondary | ICD-10-CM | POA: Diagnosis not present

## 2020-11-12 DIAGNOSIS — G918 Other hydrocephalus: Secondary | ICD-10-CM | POA: Diagnosis not present

## 2020-11-12 DIAGNOSIS — K219 Gastro-esophageal reflux disease without esophagitis: Secondary | ICD-10-CM | POA: Diagnosis not present

## 2020-11-12 DIAGNOSIS — I10 Essential (primary) hypertension: Secondary | ICD-10-CM | POA: Diagnosis not present

## 2020-11-12 DIAGNOSIS — E119 Type 2 diabetes mellitus without complications: Secondary | ICD-10-CM | POA: Diagnosis not present

## 2020-11-12 DIAGNOSIS — E782 Mixed hyperlipidemia: Secondary | ICD-10-CM | POA: Diagnosis not present

## 2020-11-13 DIAGNOSIS — N390 Urinary tract infection, site not specified: Secondary | ICD-10-CM | POA: Diagnosis not present

## 2020-11-14 DIAGNOSIS — D481 Neoplasm of uncertain behavior of connective and other soft tissue: Secondary | ICD-10-CM | POA: Diagnosis not present

## 2020-11-14 DIAGNOSIS — E119 Type 2 diabetes mellitus without complications: Secondary | ICD-10-CM | POA: Diagnosis not present

## 2020-11-14 DIAGNOSIS — I10 Essential (primary) hypertension: Secondary | ICD-10-CM | POA: Diagnosis not present

## 2020-11-14 DIAGNOSIS — G918 Other hydrocephalus: Secondary | ICD-10-CM | POA: Diagnosis not present

## 2020-11-14 DIAGNOSIS — E782 Mixed hyperlipidemia: Secondary | ICD-10-CM | POA: Diagnosis not present

## 2020-11-14 DIAGNOSIS — K219 Gastro-esophageal reflux disease without esophagitis: Secondary | ICD-10-CM | POA: Diagnosis not present

## 2020-11-18 DIAGNOSIS — I10 Essential (primary) hypertension: Secondary | ICD-10-CM | POA: Diagnosis not present

## 2020-11-18 DIAGNOSIS — D481 Neoplasm of uncertain behavior of connective and other soft tissue: Secondary | ICD-10-CM | POA: Diagnosis not present

## 2020-11-18 DIAGNOSIS — E119 Type 2 diabetes mellitus without complications: Secondary | ICD-10-CM | POA: Diagnosis not present

## 2020-11-18 DIAGNOSIS — G918 Other hydrocephalus: Secondary | ICD-10-CM | POA: Diagnosis not present

## 2020-11-18 DIAGNOSIS — K219 Gastro-esophageal reflux disease without esophagitis: Secondary | ICD-10-CM | POA: Diagnosis not present

## 2020-11-18 DIAGNOSIS — E782 Mixed hyperlipidemia: Secondary | ICD-10-CM | POA: Diagnosis not present

## 2020-11-20 ENCOUNTER — Other Ambulatory Visit: Payer: Self-pay

## 2020-11-20 ENCOUNTER — Ambulatory Visit
Admission: RE | Admit: 2020-11-20 | Discharge: 2020-11-20 | Disposition: A | Discharge status: Home / Self Care | Payer: Medicare Other | Source: Ambulatory Visit | Attending: Neurosurgery | Admitting: Neurosurgery

## 2020-11-20 DIAGNOSIS — D432 Neoplasm of uncertain behavior of brain, unspecified: Secondary | ICD-10-CM

## 2020-11-20 DIAGNOSIS — Z982 Presence of cerebrospinal fluid drainage device: Secondary | ICD-10-CM | POA: Diagnosis not present

## 2020-11-20 DIAGNOSIS — R262 Difficulty in walking, not elsewhere classified: Secondary | ICD-10-CM | POA: Diagnosis not present

## 2020-11-20 IMAGING — MR MR HEAD WO/W CM
12 series · 47 of 48 positions shown · IV contrast (multihance)
Comparison: MRI [DATE].

CLINICAL DATA: Hemangioblastoma follow-up after resection.
Difficulty walking.

EXAM:
MRI HEAD WITHOUT AND WITH CONTRAST
TECHNIQUE: Multiplanar, multiecho pulse sequences of the brain and surrounding
structures were obtained without and with intravenous contrast.
CONTRAST:  17mL MULTIHANCE GADOBENATE DIMEGLUMINE 529 MG/ML IV SOLN

[Series 5: T1 · sagittal · 4.0mm · 0.75mm/px · 1 of 31 slices shown (1 of 3)]
[im 1/31]
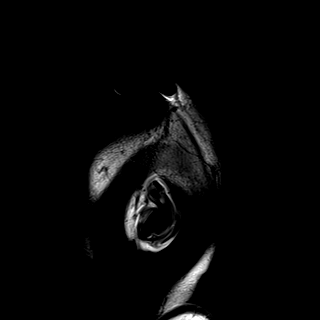

[Series 6: DWI · axial · 3.0mm · 0.94mm/px · z∈[-63,+86]mm · 9 of 160 slices shown]
[im 1/160]
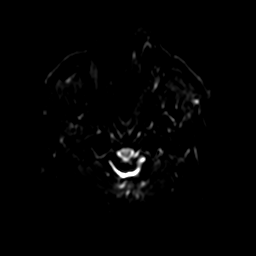
[im 20/160]
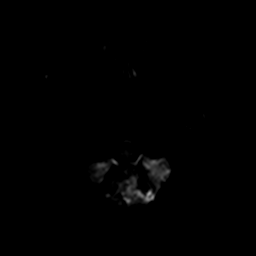
[im 40/160]
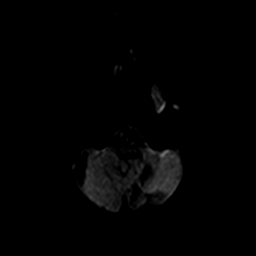
[im 60/160]
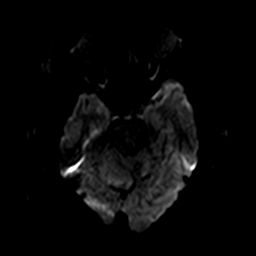
[im 80/160]
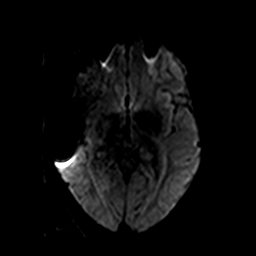
[im 100/160]
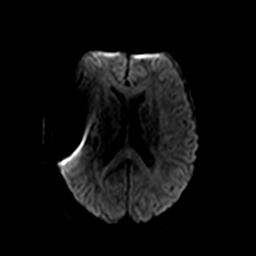
[im 120/160]
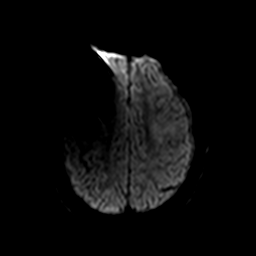
[im 140/160]
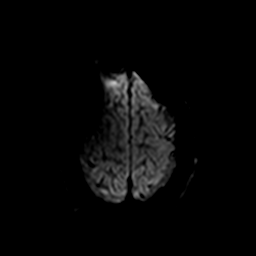
[im 160/160]
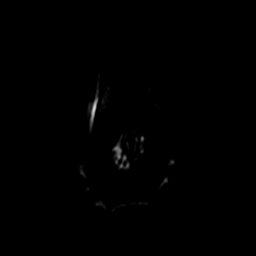

[Series 7: ax dwi_tracew · axial · 3.0mm · 0.94mm/px · z∈[-63,+86]mm · 5 of 80 slices shown]
[im 1/80]
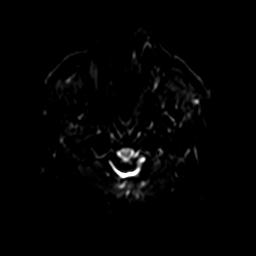
[im 20/80]
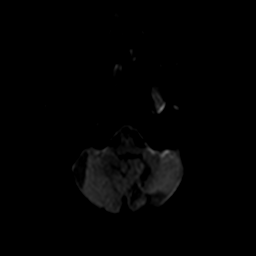
[im 40/80]
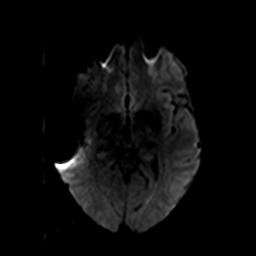
[im 60/80]
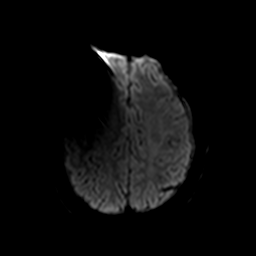
[im 80/80]
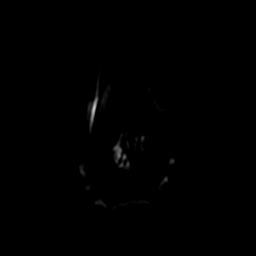

[Series 8: ax dwi_adc · axial · 3.0mm · 0.94mm/px · z∈[-63,+86]mm · 2 of 39 slices shown]
[im 1/39]
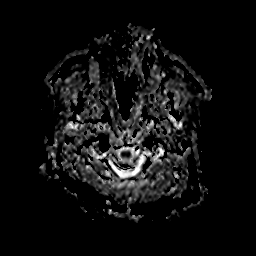
[im 39/39]
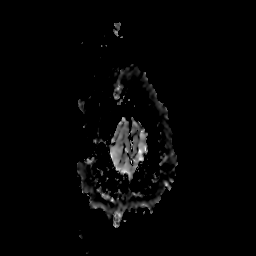

[Series 9: T2 · axial · 4.0mm · 0.36mm/px · z∈[-59,+84]mm · 2 of 27 slices shown]
[im 1/27]
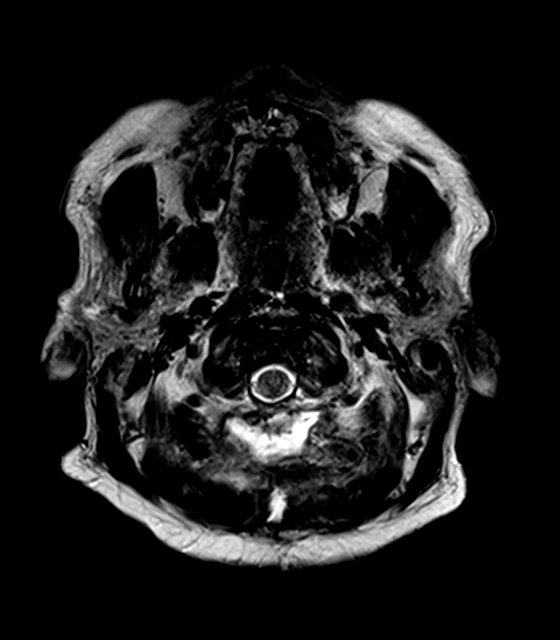
[im 27/27]
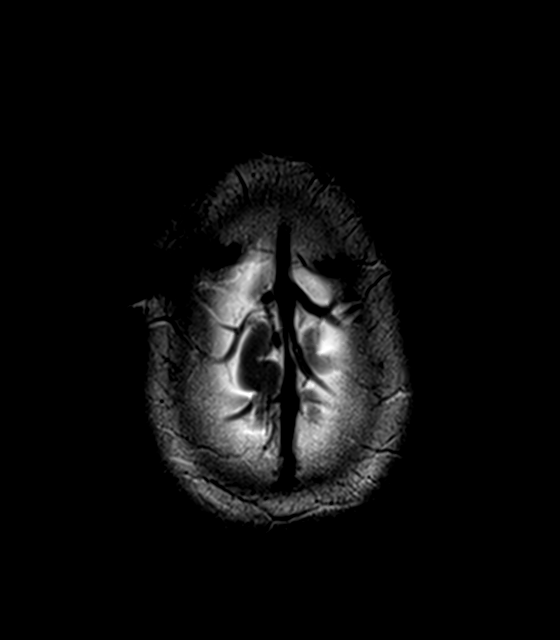

[Series 10: FLAIR · axial · 3.0mm · 0.72mm/px · 1 of 26 slices shown]
[im 1/26]
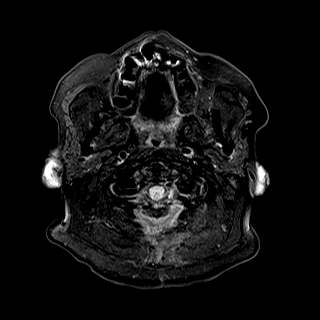

[Series 12: swi_images · axial · 2.2mm · 0.90mm/px · z∈[-64,+90]mm · 4 of 72 slices shown]
[im 1/72]
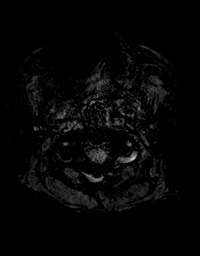
[im 24/72]
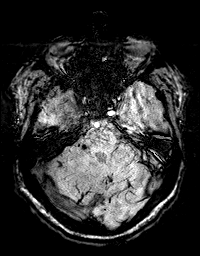
[im 48/72]
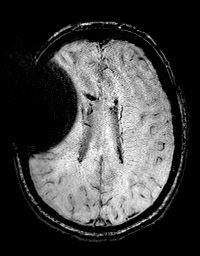
[im 72/72]
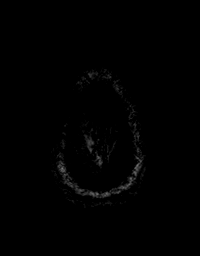

[Series 13: T1 · axial · 1.0mm · 0.94mm/px · z∈[-65,+92]mm · 9 of 160 slices shown (2 of 3)]
[im 1/160]
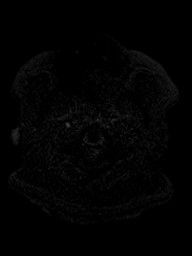
[im 20/160]
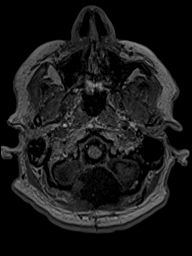
[im 40/160]
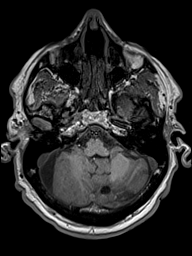
[im 60/160]
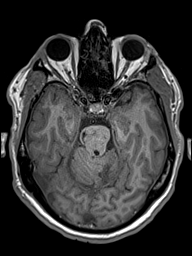
[im 80/160]
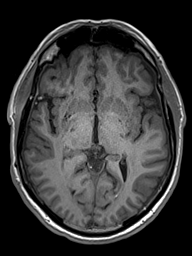
[im 100/160]
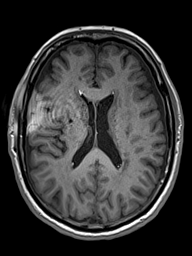
[im 120/160]
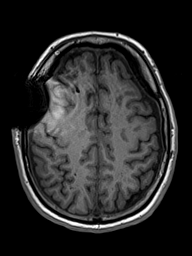
[im 140/160]
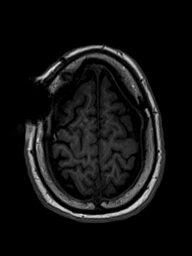
[im 160/160]
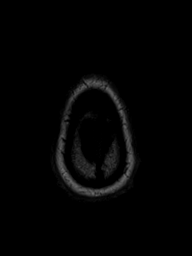

[Series 18: T2 post-contrast · coronal · 4.5mm · 0.36mm/px · 2 of 33 slices shown]
[im 1/33]
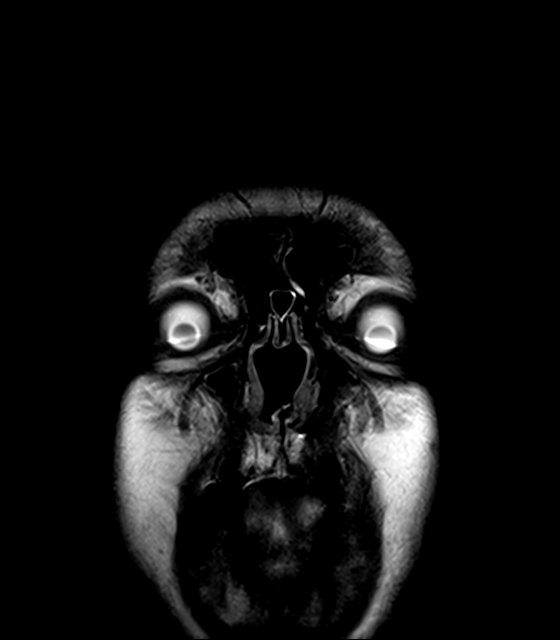
[im 33/33]
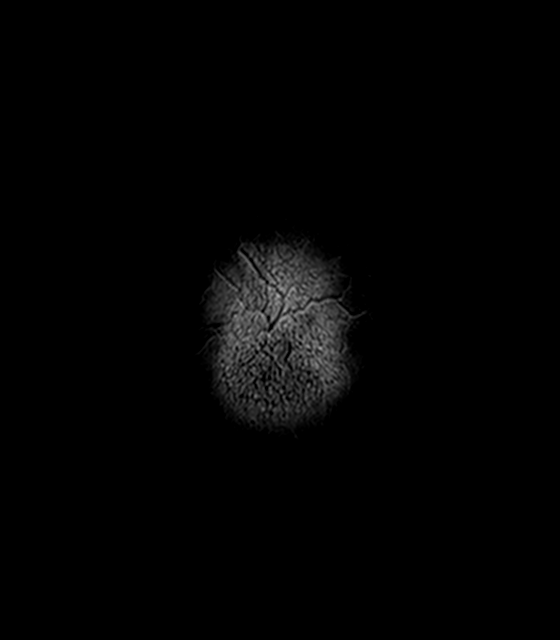

[Series 19: T1 · axial · 1.0mm · 0.94mm/px · z∈[-65,+92]mm · 8 of 159 slices shown (3 of 3)]
[im 1/159]
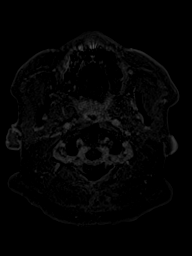
[im 20/159]
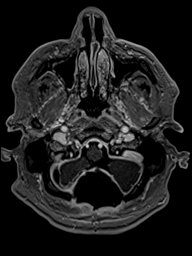
[im 40/159]
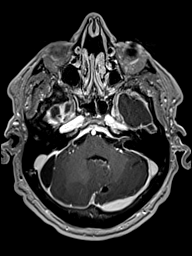
[im 60/159]
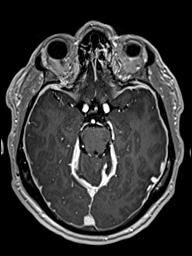
[im 99/159]
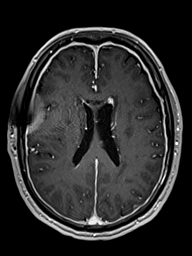
[im 119/159]
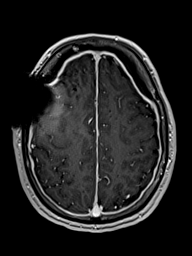
[im 139/159]
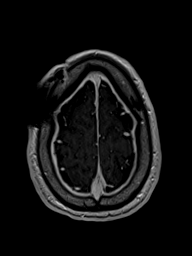
[im 159/159]
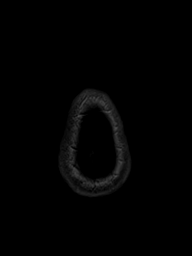

[Series 20: T1 post-contrast · coronal · 4.5mm · 0.72mm/px · 2 of 29 slices shown (1 of 2)]
[im 1/29]
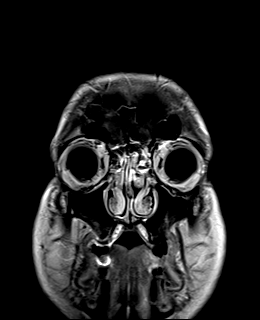
[im 29/29]
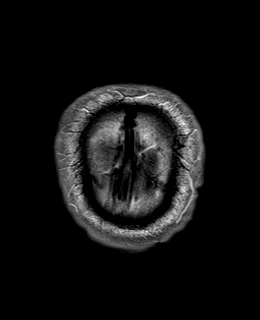

[Series 21: T1 post-contrast · sagittal · 4.0mm · 0.75mm/px · 2 of 31 slices shown (2 of 2)]
[im 1/31]
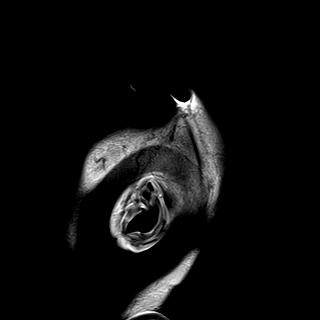
[im 31/31]
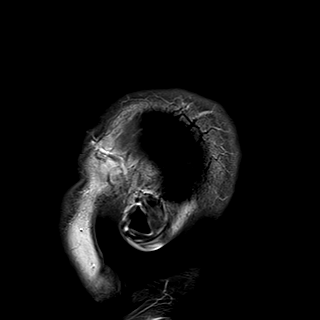

[47 of 48 positions shown; findings below may reference images not displayed]

FINDINGS: Brain:

Slightly more contracted left cerebellar resection cavity with
similar hemosiderin lining. Resection cavity extends superiorly
towards the vermis where there is a nodular focus of enhancement
that measures 10 x 7 mm (previously 6 x 5 mm). Additional areas of
peripheral enhancement along the resection cavity are similar.
Amorphous parenchymal cerebellar enhancement at the posterior aspect
of the resection cavity is also similar (for example series 19,
images 33 through 37) with correlate T2/FLAIR hyperintensity in this
region not substantially changed.

Right frontal ventriculostomy catheter in place the with the tip
likely in the region of the inferior right lateral ventricle,
anterior to the foramina [place]. The ventricles are decompressed
without evidence of hydrocephalus. Interval development of bilateral
4 mm subdural fluid collections which are T2 hyperintense and
incompletely suppress on FLAIR. Diffuse dural enhancement.

Interval decrease in size of the previously noted CSF extra-axial
fluid collection along the right aspect of the lateral cerebellar
convexity, measuring up to 1.2 cm in thickness (previously 2.1 cm).
Interval decrease in size of a suboccipital fluid collection at C1
which measures up to approximately 4.1 x 4.8 cm (previously 5.8 x
6.4 cm when re-measured). Both these collections do not suppress on
FLAIR imaging, suggesting they are complex.

Diffusion-weighted imaging is limited by artifact from the patient's
shunt catheter without visible acute infarct. No evidence of acute
hemorrhage. Foci of susceptibility artifact in the right thalamus,
right caudate, and right subinsular white matter are likely related
to prior microhemorrhage. Similar T2/FLAIR hyperintensities within
the pons, likely related to prior infarcts. Similar dilated
perivascular spaces in bilateral basal ganglia. Similar additional
scattered T2/FLAIR hyperintensities within the white matter and
pons, most likely related to chronic microvascular ischemic disease.

Vascular: Major arterial flow voids are maintained at the skull
base. The dural venous sinuses are engorged in comparison to prior.
Additionally, there is prominence of the venous plexus in the
visualized upper cervical spine.

Skull and upper cervical spine: Normal marrow signal. Venous
engorgement within the epidural space of the partially imaged upper
cervical canal.

Sinuses/Orbits: Pansinus mucosal thickening.

Other: No mastoid effusions.
IMPRESSION: 1. Nodular enhancement at the deep margin of the resection cavity
has increased in size/bulk, described above and concerning for
progression of residual tumor. Additional areas of peripheral
enhancement about the resection cavity and amorphous enhancement in
the surrounding cerebellum along the posterior aspect of the
resection cavity are not substantially changed.
2. Decompressed ventricles with right frontal ventriculostomy
catheter in place. Interval development of findings compatible with
intracranial hypotension, including 4 mm cerebral convexity subdural
fluid collections, diffuse dural enhancement, venous engorgement,
and upwardly convex pituitary. This could be due to over-shunting
and/or CSF leak.
3. Interval decrease in size of the extra-axial collection along the
right lateral cerebellum. Also decrease in the size of the
suboccipital fluid collection at C1, likely a pseudomeningocele.
Both collections incompletely suppress on FLAIR suggesting they are
complex.
4. Pansinus paranasal sinus mucosal thickening.

FIndings discussed with MODNE, PA via telephone at [DATE].

## 2020-11-20 MED ORDER — GADOBENATE DIMEGLUMINE 529 MG/ML IV SOLN
17.0000 mL | Freq: Once | INTRAVENOUS | Status: AC | PRN
  Administered 2020-11-20: 17 mL via INTRAVENOUS

## 2020-11-25 DIAGNOSIS — K219 Gastro-esophageal reflux disease without esophagitis: Secondary | ICD-10-CM | POA: Diagnosis not present

## 2020-11-25 DIAGNOSIS — E119 Type 2 diabetes mellitus without complications: Secondary | ICD-10-CM | POA: Diagnosis not present

## 2020-11-25 DIAGNOSIS — E782 Mixed hyperlipidemia: Secondary | ICD-10-CM | POA: Diagnosis not present

## 2020-11-25 DIAGNOSIS — G918 Other hydrocephalus: Secondary | ICD-10-CM | POA: Diagnosis not present

## 2020-11-25 DIAGNOSIS — D481 Neoplasm of uncertain behavior of connective and other soft tissue: Secondary | ICD-10-CM | POA: Diagnosis not present

## 2020-11-25 DIAGNOSIS — I10 Essential (primary) hypertension: Secondary | ICD-10-CM | POA: Diagnosis not present

## 2020-12-06 DIAGNOSIS — G918 Other hydrocephalus: Secondary | ICD-10-CM | POA: Diagnosis not present

## 2020-12-06 DIAGNOSIS — K219 Gastro-esophageal reflux disease without esophagitis: Secondary | ICD-10-CM | POA: Diagnosis not present

## 2020-12-06 DIAGNOSIS — D481 Neoplasm of uncertain behavior of connective and other soft tissue: Secondary | ICD-10-CM | POA: Diagnosis not present

## 2020-12-06 DIAGNOSIS — I10 Essential (primary) hypertension: Secondary | ICD-10-CM | POA: Diagnosis not present

## 2020-12-06 DIAGNOSIS — E119 Type 2 diabetes mellitus without complications: Secondary | ICD-10-CM | POA: Diagnosis not present

## 2020-12-06 DIAGNOSIS — E782 Mixed hyperlipidemia: Secondary | ICD-10-CM | POA: Diagnosis not present

## 2020-12-11 DIAGNOSIS — F5101 Primary insomnia: Secondary | ICD-10-CM | POA: Diagnosis not present

## 2020-12-11 DIAGNOSIS — Z8673 Personal history of transient ischemic attack (TIA), and cerebral infarction without residual deficits: Secondary | ICD-10-CM | POA: Diagnosis not present

## 2020-12-11 DIAGNOSIS — E119 Type 2 diabetes mellitus without complications: Secondary | ICD-10-CM | POA: Diagnosis not present

## 2020-12-11 DIAGNOSIS — M546 Pain in thoracic spine: Secondary | ICD-10-CM | POA: Diagnosis not present

## 2020-12-11 DIAGNOSIS — Z982 Presence of cerebrospinal fluid drainage device: Secondary | ICD-10-CM | POA: Diagnosis not present

## 2020-12-11 DIAGNOSIS — R32 Unspecified urinary incontinence: Secondary | ICD-10-CM | POA: Diagnosis not present

## 2020-12-11 DIAGNOSIS — I10 Essential (primary) hypertension: Secondary | ICD-10-CM | POA: Diagnosis not present

## 2020-12-11 DIAGNOSIS — K219 Gastro-esophageal reflux disease without esophagitis: Secondary | ICD-10-CM | POA: Diagnosis not present

## 2020-12-11 DIAGNOSIS — M1812 Unilateral primary osteoarthritis of first carpometacarpal joint, left hand: Secondary | ICD-10-CM | POA: Diagnosis not present

## 2020-12-11 DIAGNOSIS — Z87440 Personal history of urinary (tract) infections: Secondary | ICD-10-CM | POA: Diagnosis not present

## 2020-12-11 DIAGNOSIS — G918 Other hydrocephalus: Secondary | ICD-10-CM | POA: Diagnosis not present

## 2020-12-11 DIAGNOSIS — E782 Mixed hyperlipidemia: Secondary | ICD-10-CM | POA: Diagnosis not present

## 2020-12-11 DIAGNOSIS — D481 Neoplasm of uncertain behavior of connective and other soft tissue: Secondary | ICD-10-CM | POA: Diagnosis not present

## 2020-12-11 DIAGNOSIS — Z9181 History of falling: Secondary | ICD-10-CM | POA: Diagnosis not present

## 2020-12-13 DIAGNOSIS — G918 Other hydrocephalus: Secondary | ICD-10-CM | POA: Diagnosis not present

## 2020-12-13 DIAGNOSIS — E119 Type 2 diabetes mellitus without complications: Secondary | ICD-10-CM | POA: Diagnosis not present

## 2020-12-13 DIAGNOSIS — E782 Mixed hyperlipidemia: Secondary | ICD-10-CM | POA: Diagnosis not present

## 2020-12-13 DIAGNOSIS — D481 Neoplasm of uncertain behavior of connective and other soft tissue: Secondary | ICD-10-CM | POA: Diagnosis not present

## 2020-12-13 DIAGNOSIS — K219 Gastro-esophageal reflux disease without esophagitis: Secondary | ICD-10-CM | POA: Diagnosis not present

## 2020-12-13 DIAGNOSIS — I10 Essential (primary) hypertension: Secondary | ICD-10-CM | POA: Diagnosis not present

## 2020-12-16 DIAGNOSIS — G919 Hydrocephalus, unspecified: Secondary | ICD-10-CM | POA: Diagnosis not present

## 2020-12-17 DIAGNOSIS — G918 Other hydrocephalus: Secondary | ICD-10-CM | POA: Diagnosis not present

## 2020-12-17 DIAGNOSIS — D481 Neoplasm of uncertain behavior of connective and other soft tissue: Secondary | ICD-10-CM | POA: Diagnosis not present

## 2020-12-17 DIAGNOSIS — E782 Mixed hyperlipidemia: Secondary | ICD-10-CM | POA: Diagnosis not present

## 2020-12-17 DIAGNOSIS — K219 Gastro-esophageal reflux disease without esophagitis: Secondary | ICD-10-CM | POA: Diagnosis not present

## 2020-12-17 DIAGNOSIS — I10 Essential (primary) hypertension: Secondary | ICD-10-CM | POA: Diagnosis not present

## 2020-12-17 DIAGNOSIS — E119 Type 2 diabetes mellitus without complications: Secondary | ICD-10-CM | POA: Diagnosis not present

## 2020-12-18 DIAGNOSIS — G919 Hydrocephalus, unspecified: Secondary | ICD-10-CM | POA: Diagnosis not present

## 2020-12-19 DIAGNOSIS — E119 Type 2 diabetes mellitus without complications: Secondary | ICD-10-CM | POA: Diagnosis not present

## 2020-12-19 DIAGNOSIS — E782 Mixed hyperlipidemia: Secondary | ICD-10-CM | POA: Diagnosis not present

## 2020-12-19 DIAGNOSIS — I10 Essential (primary) hypertension: Secondary | ICD-10-CM | POA: Diagnosis not present

## 2020-12-19 DIAGNOSIS — G918 Other hydrocephalus: Secondary | ICD-10-CM | POA: Diagnosis not present

## 2020-12-19 DIAGNOSIS — D481 Neoplasm of uncertain behavior of connective and other soft tissue: Secondary | ICD-10-CM | POA: Diagnosis not present

## 2020-12-19 DIAGNOSIS — K219 Gastro-esophageal reflux disease without esophagitis: Secondary | ICD-10-CM | POA: Diagnosis not present

## 2020-12-24 DIAGNOSIS — E119 Type 2 diabetes mellitus without complications: Secondary | ICD-10-CM | POA: Diagnosis not present

## 2020-12-24 DIAGNOSIS — I10 Essential (primary) hypertension: Secondary | ICD-10-CM | POA: Diagnosis not present

## 2020-12-24 DIAGNOSIS — K219 Gastro-esophageal reflux disease without esophagitis: Secondary | ICD-10-CM | POA: Diagnosis not present

## 2020-12-24 DIAGNOSIS — D481 Neoplasm of uncertain behavior of connective and other soft tissue: Secondary | ICD-10-CM | POA: Diagnosis not present

## 2020-12-24 DIAGNOSIS — G918 Other hydrocephalus: Secondary | ICD-10-CM | POA: Diagnosis not present

## 2020-12-24 DIAGNOSIS — E782 Mixed hyperlipidemia: Secondary | ICD-10-CM | POA: Diagnosis not present

## 2020-12-25 ENCOUNTER — Other Ambulatory Visit (HOSPITAL_COMMUNITY): Payer: Self-pay | Admitting: Physician Assistant

## 2020-12-25 ENCOUNTER — Other Ambulatory Visit: Payer: Self-pay | Admitting: Physician Assistant

## 2020-12-25 DIAGNOSIS — G919 Hydrocephalus, unspecified: Secondary | ICD-10-CM

## 2020-12-30 ENCOUNTER — Other Ambulatory Visit: Payer: Self-pay

## 2020-12-30 ENCOUNTER — Ambulatory Visit (HOSPITAL_COMMUNITY)
Admission: RE | Admit: 2020-12-30 | Discharge: 2020-12-30 | Disposition: A | Discharge status: Home / Self Care | Payer: Medicare Other | Source: Ambulatory Visit | Attending: Physician Assistant | Admitting: Physician Assistant

## 2020-12-30 DIAGNOSIS — D432 Neoplasm of uncertain behavior of brain, unspecified: Secondary | ICD-10-CM | POA: Diagnosis not present

## 2020-12-30 DIAGNOSIS — G919 Hydrocephalus, unspecified: Secondary | ICD-10-CM | POA: Diagnosis not present

## 2020-12-30 DIAGNOSIS — D496 Neoplasm of unspecified behavior of brain: Secondary | ICD-10-CM | POA: Diagnosis not present

## 2020-12-30 DIAGNOSIS — I10 Essential (primary) hypertension: Secondary | ICD-10-CM | POA: Diagnosis not present

## 2020-12-30 DIAGNOSIS — Z6828 Body mass index (BMI) 28.0-28.9, adult: Secondary | ICD-10-CM | POA: Diagnosis not present

## 2020-12-30 DIAGNOSIS — G9389 Other specified disorders of brain: Secondary | ICD-10-CM | POA: Diagnosis not present

## 2020-12-30 DIAGNOSIS — I6381 Other cerebral infarction due to occlusion or stenosis of small artery: Secondary | ICD-10-CM | POA: Diagnosis not present

## 2020-12-30 IMAGING — CT CT HEAD W/O CM
4 series · 16 of 47 positions shown, 18 images · non-contrast
Comparison: Head MRI [DATE]

CLINICAL DATA: Hydrocephalus.  Unsteady gait.

EXAM:
CT HEAD WITHOUT CONTRAST
TECHNIQUE: Contiguous axial images were obtained from the base of the skull
through the vertex without intravenous contrast.

[Series 2: head wo · axial · 0.50mm/px · z∈[-59,+71]mm · 7 of 36 slices shown, 9 images]
[im 5/36  brain]
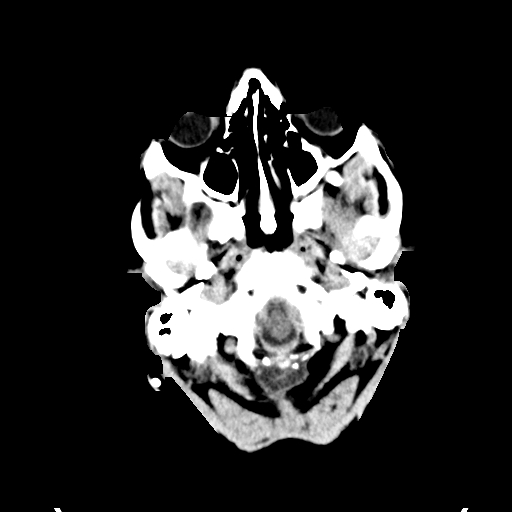
[im 5/36  bone]
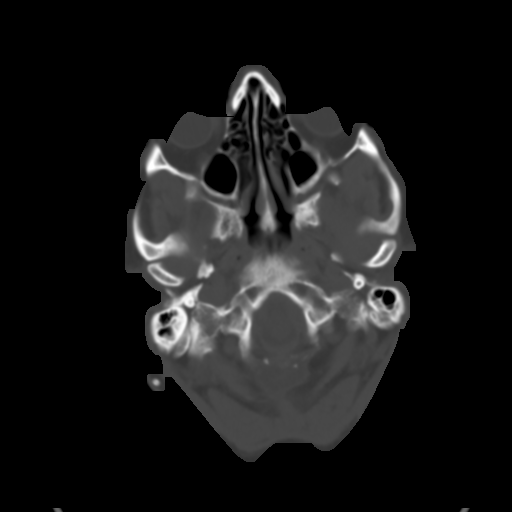
[im 9/36  brain]
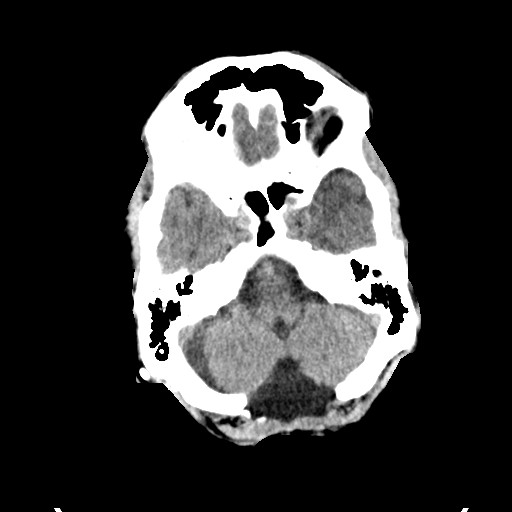
[im 14/36  brain]
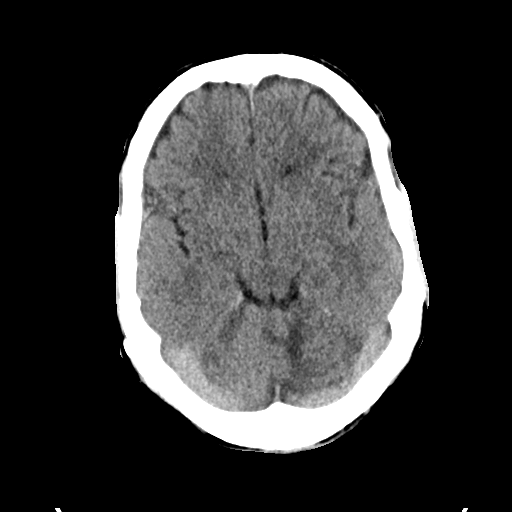
[im 18/36  brain]
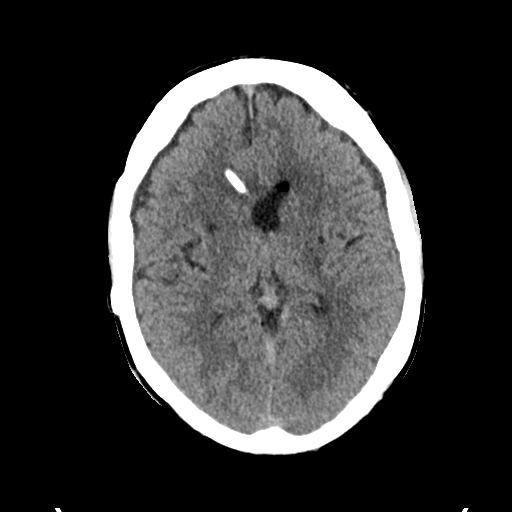
[im 22/36  brain]
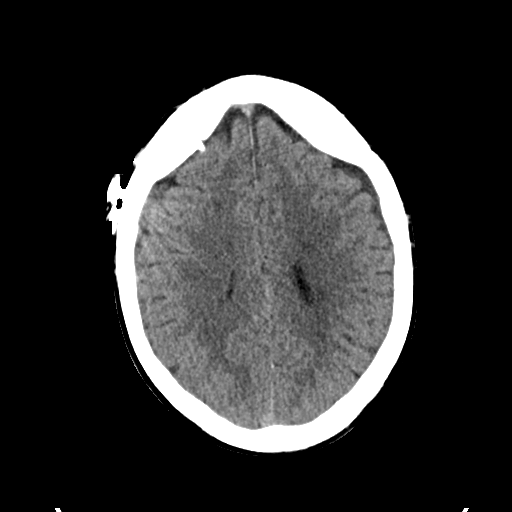
[im 22/36  bone]
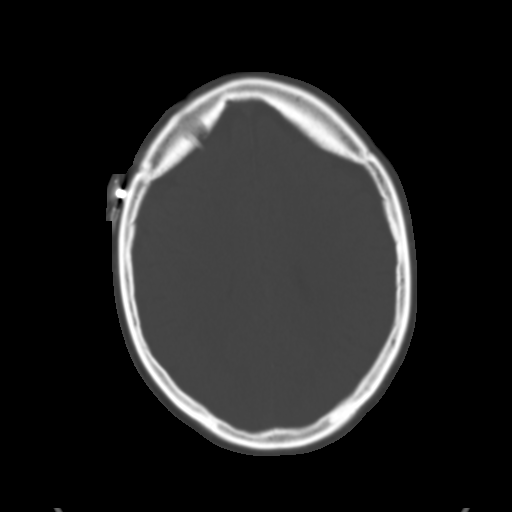
[im 27/36  brain]
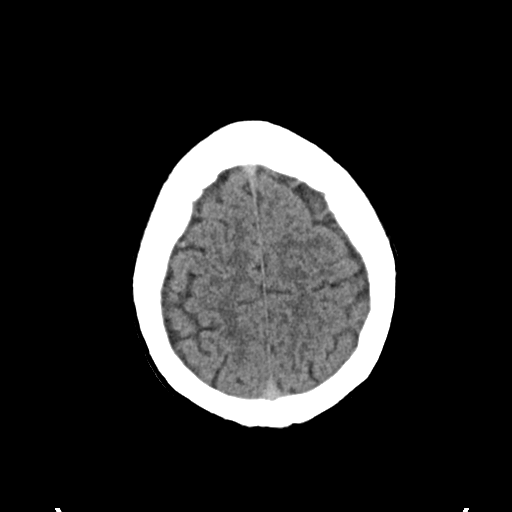
[im 31/36  brain]
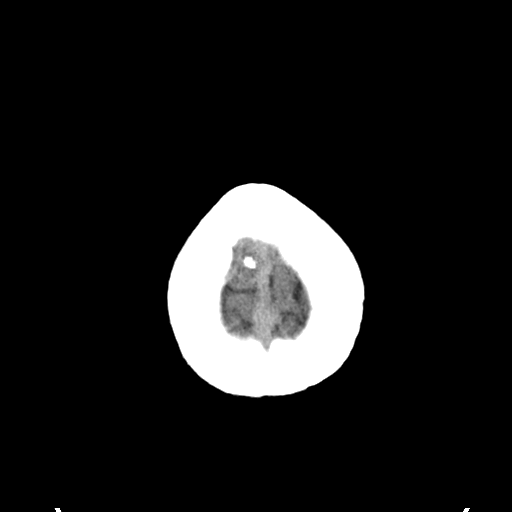

[Series 3: head bone · axial · 0.50mm/px · z∈[-63,-27]mm · 3 of 89 slices shown]
[im 9/89  bone]
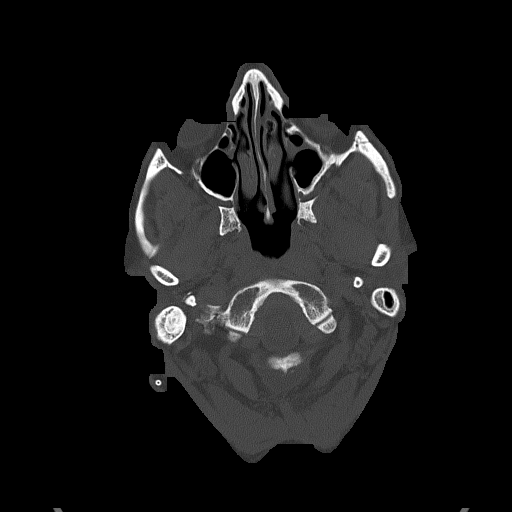
[im 18/89  bone]
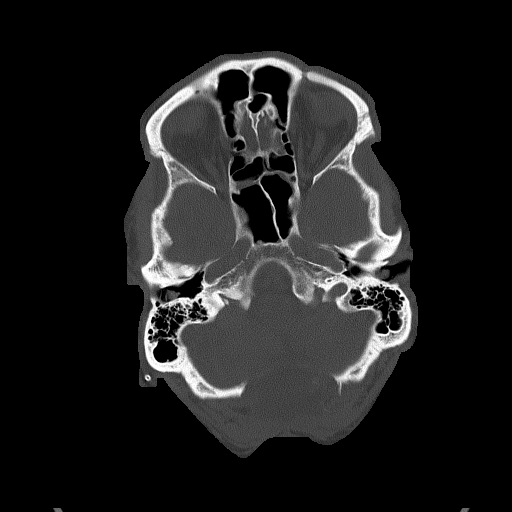
[im 27/89  bone]
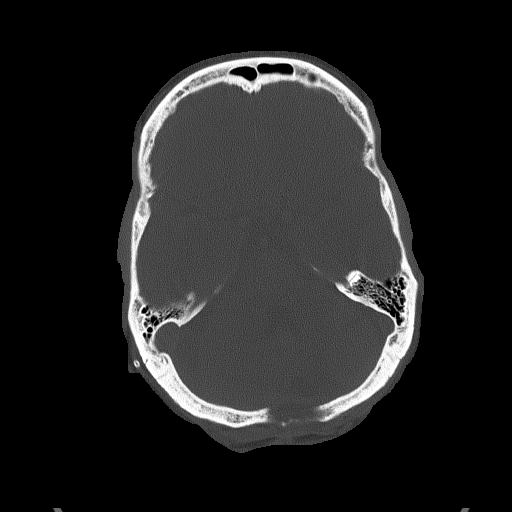

[Series 4: coronal soft tissue · coronal · 0.40mm/px · 3 of 76 slices shown]
[im 26/76  brain]
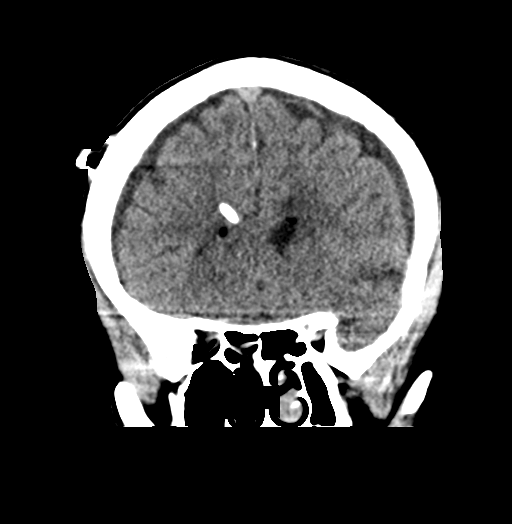
[im 34/76  brain]
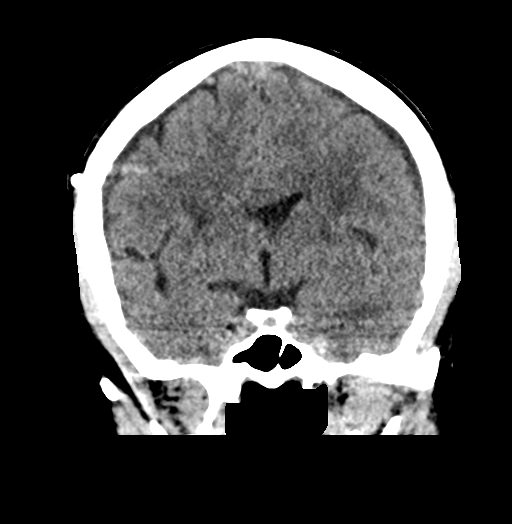
[im 42/76  brain]
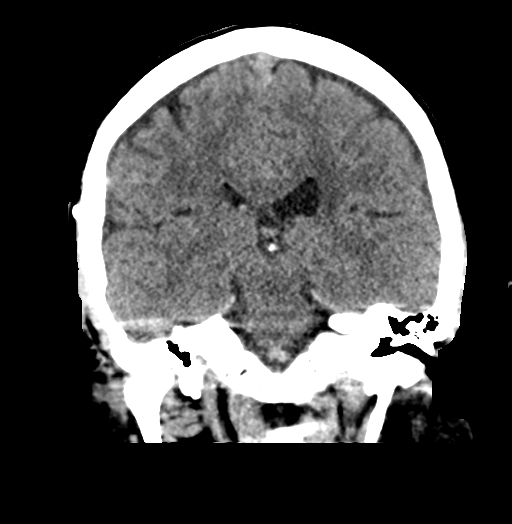

[Series 5: sagittal soft tissue · sagittal · 0.40mm/px · 3 of 57 slices shown]
[im 19/57  brain]
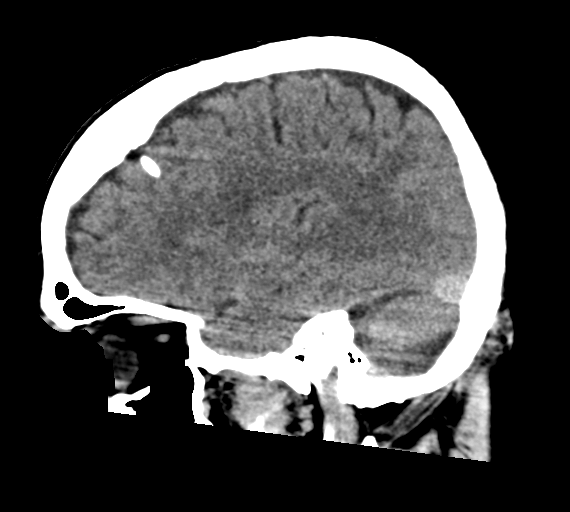
[im 29/57  brain]
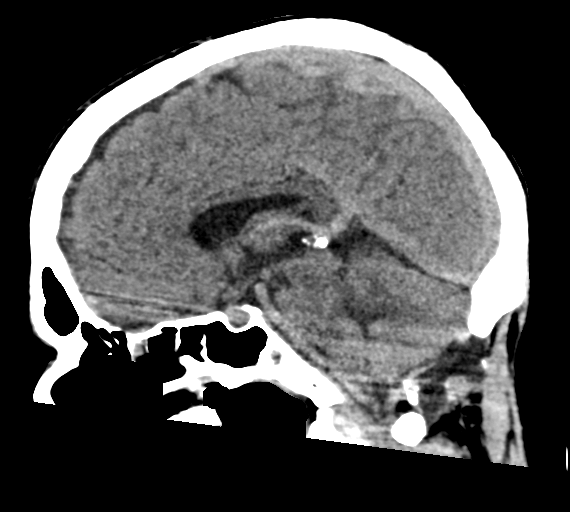
[im 38/57  brain]
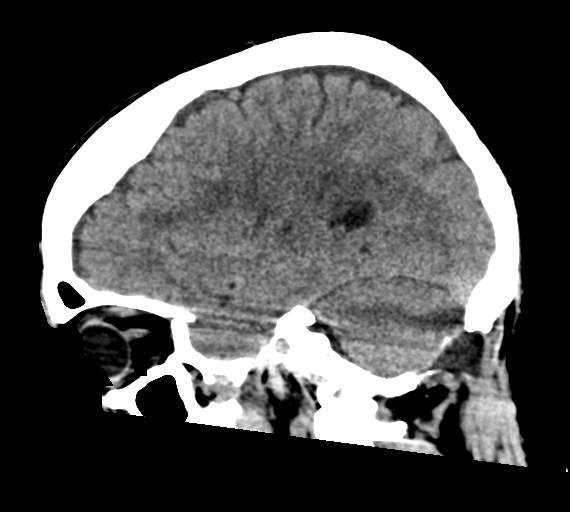

[16 of 47 positions shown; findings below may reference images not displayed]

FINDINGS: Brain: A right frontal approach ventriculostomy catheter remains in
place, coursing through the frontal horn of the right lateral
ventricle and terminating in the midline. There is minimal gas in
the frontal horns of the lateral ventricles. The ventricles are
nondilated, and the lateral ventricles are slightly smaller than on
the prior MRI with the right lateral ventricle being smaller than
the left. Small low-density subdural fluid collections over both
cerebral convexities are similar in size to the prior MRI and
measure 4-5 mm in thickness without significant mass effect.

Patchy hypodensities in the cerebral white matter bilaterally
correspond to T2 hyperintensities on the prior MRI and are
nonspecific but compatible with moderate chronic small vessel
ischemic disease. Chronic lacunar infarcts are noted in the basal
ganglia/deep cerebral white matter bilaterally. No acute cortically
based infarct is evident. There is no midline shift.

Sequelae of suboccipital craniectomy and posterior fossa tumor
resection are again identified with similar appearance of an
approximately 4.5 cm fluid collection at the craniectomy site. A
subdural fluid collection laterally over the right cerebellar
hemisphere is also unchanged measuring up to 1.2 cm in thickness.
Hypodensity in the left cerebellar hemisphere and vermis correspond
to encephalomalacia and gliosis on MRI. Nodular enhancement in the
posterior fossa on the prior MRI is not evaluated on this
noncontrast CT.

Vascular: Calcified atherosclerosis at the skull base. No hyperdense
vessel.

Skull: Suboccipital craniectomy.  No suspicious skull lesion.

Sinuses/Orbits: Minimal scattered mucosal thickening in the
paranasal sinuses. Clear mastoid air cells. Unremarkable orbits.

Other: None.
IMPRESSION: 1. Slightly decreased size of the lateral ventricles with unchanged
positioning of the ventriculostomy catheter.
2. Unchanged small bilateral subdural fluid collections without
significant mass effect.
3. Moderate chronic small vessel ischemic disease.
4. Postoperative changes in the posterior fossa with unchanged fluid
collection/pseudomeningocele at the craniectomy site and small
subdural collection over the lateral right cerebellar hemisphere.

## 2021-01-01 DIAGNOSIS — E782 Mixed hyperlipidemia: Secondary | ICD-10-CM | POA: Diagnosis not present

## 2021-01-01 DIAGNOSIS — G918 Other hydrocephalus: Secondary | ICD-10-CM | POA: Diagnosis not present

## 2021-01-01 DIAGNOSIS — E119 Type 2 diabetes mellitus without complications: Secondary | ICD-10-CM | POA: Diagnosis not present

## 2021-01-01 DIAGNOSIS — K219 Gastro-esophageal reflux disease without esophagitis: Secondary | ICD-10-CM | POA: Diagnosis not present

## 2021-01-01 DIAGNOSIS — D481 Neoplasm of uncertain behavior of connective and other soft tissue: Secondary | ICD-10-CM | POA: Diagnosis not present

## 2021-01-01 DIAGNOSIS — I10 Essential (primary) hypertension: Secondary | ICD-10-CM | POA: Diagnosis not present

## 2021-01-08 DIAGNOSIS — I10 Essential (primary) hypertension: Secondary | ICD-10-CM | POA: Diagnosis not present

## 2021-01-08 DIAGNOSIS — E119 Type 2 diabetes mellitus without complications: Secondary | ICD-10-CM | POA: Diagnosis not present

## 2021-01-08 DIAGNOSIS — E782 Mixed hyperlipidemia: Secondary | ICD-10-CM | POA: Diagnosis not present

## 2021-01-08 DIAGNOSIS — G918 Other hydrocephalus: Secondary | ICD-10-CM | POA: Diagnosis not present

## 2021-01-08 DIAGNOSIS — K219 Gastro-esophageal reflux disease without esophagitis: Secondary | ICD-10-CM | POA: Diagnosis not present

## 2021-01-08 DIAGNOSIS — D481 Neoplasm of uncertain behavior of connective and other soft tissue: Secondary | ICD-10-CM | POA: Diagnosis not present

## 2021-01-10 DIAGNOSIS — K219 Gastro-esophageal reflux disease without esophagitis: Secondary | ICD-10-CM | POA: Diagnosis not present

## 2021-01-10 DIAGNOSIS — Z9181 History of falling: Secondary | ICD-10-CM | POA: Diagnosis not present

## 2021-01-10 DIAGNOSIS — M1812 Unilateral primary osteoarthritis of first carpometacarpal joint, left hand: Secondary | ICD-10-CM | POA: Diagnosis not present

## 2021-01-10 DIAGNOSIS — Z982 Presence of cerebrospinal fluid drainage device: Secondary | ICD-10-CM | POA: Diagnosis not present

## 2021-01-10 DIAGNOSIS — F5101 Primary insomnia: Secondary | ICD-10-CM | POA: Diagnosis not present

## 2021-01-10 DIAGNOSIS — G918 Other hydrocephalus: Secondary | ICD-10-CM | POA: Diagnosis not present

## 2021-01-10 DIAGNOSIS — E119 Type 2 diabetes mellitus without complications: Secondary | ICD-10-CM | POA: Diagnosis not present

## 2021-01-10 DIAGNOSIS — M546 Pain in thoracic spine: Secondary | ICD-10-CM | POA: Diagnosis not present

## 2021-01-10 DIAGNOSIS — R32 Unspecified urinary incontinence: Secondary | ICD-10-CM | POA: Diagnosis not present

## 2021-01-10 DIAGNOSIS — D481 Neoplasm of uncertain behavior of connective and other soft tissue: Secondary | ICD-10-CM | POA: Diagnosis not present

## 2021-01-10 DIAGNOSIS — Z87440 Personal history of urinary (tract) infections: Secondary | ICD-10-CM | POA: Diagnosis not present

## 2021-01-10 DIAGNOSIS — E782 Mixed hyperlipidemia: Secondary | ICD-10-CM | POA: Diagnosis not present

## 2021-01-10 DIAGNOSIS — I10 Essential (primary) hypertension: Secondary | ICD-10-CM | POA: Diagnosis not present

## 2021-01-10 DIAGNOSIS — Z8673 Personal history of transient ischemic attack (TIA), and cerebral infarction without residual deficits: Secondary | ICD-10-CM | POA: Diagnosis not present

## 2021-01-17 DIAGNOSIS — H5203 Hypermetropia, bilateral: Secondary | ICD-10-CM | POA: Diagnosis not present

## 2021-01-17 DIAGNOSIS — H52223 Regular astigmatism, bilateral: Secondary | ICD-10-CM | POA: Diagnosis not present

## 2021-01-17 DIAGNOSIS — H2513 Age-related nuclear cataract, bilateral: Secondary | ICD-10-CM | POA: Diagnosis not present

## 2021-01-17 DIAGNOSIS — H524 Presbyopia: Secondary | ICD-10-CM | POA: Diagnosis not present

## 2021-01-20 DIAGNOSIS — Z125 Encounter for screening for malignant neoplasm of prostate: Secondary | ICD-10-CM | POA: Diagnosis not present

## 2021-01-20 DIAGNOSIS — Z6828 Body mass index (BMI) 28.0-28.9, adult: Secondary | ICD-10-CM | POA: Diagnosis not present

## 2021-01-20 DIAGNOSIS — G919 Hydrocephalus, unspecified: Secondary | ICD-10-CM | POA: Diagnosis not present

## 2021-01-20 DIAGNOSIS — E119 Type 2 diabetes mellitus without complications: Secondary | ICD-10-CM | POA: Diagnosis not present

## 2021-01-20 DIAGNOSIS — E782 Mixed hyperlipidemia: Secondary | ICD-10-CM | POA: Diagnosis not present

## 2021-01-20 DIAGNOSIS — D481 Neoplasm of uncertain behavior of connective and other soft tissue: Secondary | ICD-10-CM | POA: Diagnosis not present

## 2021-01-20 DIAGNOSIS — G4700 Insomnia, unspecified: Secondary | ICD-10-CM | POA: Diagnosis not present

## 2021-01-20 DIAGNOSIS — I639 Cerebral infarction, unspecified: Secondary | ICD-10-CM | POA: Diagnosis not present

## 2021-01-20 DIAGNOSIS — I10 Essential (primary) hypertension: Secondary | ICD-10-CM | POA: Diagnosis not present

## 2021-01-23 DIAGNOSIS — I10 Essential (primary) hypertension: Secondary | ICD-10-CM | POA: Diagnosis not present

## 2021-01-23 DIAGNOSIS — E782 Mixed hyperlipidemia: Secondary | ICD-10-CM | POA: Diagnosis not present

## 2021-01-23 DIAGNOSIS — E119 Type 2 diabetes mellitus without complications: Secondary | ICD-10-CM | POA: Diagnosis not present

## 2021-01-23 DIAGNOSIS — K219 Gastro-esophageal reflux disease without esophagitis: Secondary | ICD-10-CM | POA: Diagnosis not present

## 2021-01-23 DIAGNOSIS — D481 Neoplasm of uncertain behavior of connective and other soft tissue: Secondary | ICD-10-CM | POA: Diagnosis not present

## 2021-01-23 DIAGNOSIS — G918 Other hydrocephalus: Secondary | ICD-10-CM | POA: Diagnosis not present

## 2021-02-04 DIAGNOSIS — K219 Gastro-esophageal reflux disease without esophagitis: Secondary | ICD-10-CM | POA: Diagnosis not present

## 2021-02-04 DIAGNOSIS — I10 Essential (primary) hypertension: Secondary | ICD-10-CM | POA: Diagnosis not present

## 2021-02-04 DIAGNOSIS — D481 Neoplasm of uncertain behavior of connective and other soft tissue: Secondary | ICD-10-CM | POA: Diagnosis not present

## 2021-02-04 DIAGNOSIS — E782 Mixed hyperlipidemia: Secondary | ICD-10-CM | POA: Diagnosis not present

## 2021-02-04 DIAGNOSIS — G918 Other hydrocephalus: Secondary | ICD-10-CM | POA: Diagnosis not present

## 2021-02-04 DIAGNOSIS — E119 Type 2 diabetes mellitus without complications: Secondary | ICD-10-CM | POA: Diagnosis not present

## 2021-03-14 DIAGNOSIS — Z6827 Body mass index (BMI) 27.0-27.9, adult: Secondary | ICD-10-CM | POA: Diagnosis not present

## 2021-03-14 DIAGNOSIS — G919 Hydrocephalus, unspecified: Secondary | ICD-10-CM | POA: Diagnosis not present

## 2021-03-14 DIAGNOSIS — N390 Urinary tract infection, site not specified: Secondary | ICD-10-CM | POA: Diagnosis not present

## 2021-04-05 DIAGNOSIS — R2681 Unsteadiness on feet: Secondary | ICD-10-CM | POA: Diagnosis not present

## 2021-04-05 DIAGNOSIS — J9819 Other pulmonary collapse: Secondary | ICD-10-CM | POA: Diagnosis not present

## 2021-04-05 DIAGNOSIS — R0902 Hypoxemia: Secondary | ICD-10-CM | POA: Diagnosis not present

## 2021-04-05 DIAGNOSIS — N179 Acute kidney failure, unspecified: Secondary | ICD-10-CM | POA: Diagnosis not present

## 2021-04-05 DIAGNOSIS — Z982 Presence of cerebrospinal fluid drainage device: Secondary | ICD-10-CM | POA: Diagnosis not present

## 2021-04-05 DIAGNOSIS — N210 Calculus in bladder: Secondary | ICD-10-CM | POA: Diagnosis not present

## 2021-04-05 DIAGNOSIS — I10 Essential (primary) hypertension: Secondary | ICD-10-CM | POA: Diagnosis not present

## 2021-04-05 DIAGNOSIS — N390 Urinary tract infection, site not specified: Secondary | ICD-10-CM | POA: Diagnosis not present

## 2021-04-05 DIAGNOSIS — I672 Cerebral atherosclerosis: Secondary | ICD-10-CM | POA: Diagnosis not present

## 2021-04-05 DIAGNOSIS — Z20822 Contact with and (suspected) exposure to covid-19: Secondary | ICD-10-CM | POA: Diagnosis not present

## 2021-04-05 DIAGNOSIS — A419 Sepsis, unspecified organism: Secondary | ICD-10-CM | POA: Diagnosis not present

## 2021-04-05 DIAGNOSIS — R1110 Vomiting, unspecified: Secondary | ICD-10-CM | POA: Diagnosis not present

## 2021-04-05 DIAGNOSIS — M62838 Other muscle spasm: Secondary | ICD-10-CM | POA: Diagnosis not present

## 2021-04-05 DIAGNOSIS — J3489 Other specified disorders of nose and nasal sinuses: Secondary | ICD-10-CM | POA: Diagnosis not present

## 2021-04-05 DIAGNOSIS — D696 Thrombocytopenia, unspecified: Secondary | ICD-10-CM | POA: Diagnosis not present

## 2021-04-05 DIAGNOSIS — Z604 Social exclusion and rejection: Secondary | ICD-10-CM | POA: Diagnosis not present

## 2021-04-05 DIAGNOSIS — E785 Hyperlipidemia, unspecified: Secondary | ICD-10-CM | POA: Diagnosis not present

## 2021-04-05 DIAGNOSIS — G4700 Insomnia, unspecified: Secondary | ICD-10-CM | POA: Diagnosis not present

## 2021-04-05 DIAGNOSIS — A4151 Sepsis due to Escherichia coli [E. coli]: Secondary | ICD-10-CM | POA: Diagnosis not present

## 2021-04-05 DIAGNOSIS — Z79899 Other long term (current) drug therapy: Secondary | ICD-10-CM | POA: Diagnosis not present

## 2021-04-05 DIAGNOSIS — Z602 Problems related to living alone: Secondary | ICD-10-CM | POA: Diagnosis not present

## 2021-04-05 DIAGNOSIS — Z86011 Personal history of benign neoplasm of the brain: Secondary | ICD-10-CM | POA: Diagnosis not present

## 2021-04-05 DIAGNOSIS — Z8673 Personal history of transient ischemic attack (TIA), and cerebral infarction without residual deficits: Secondary | ICD-10-CM | POA: Diagnosis not present

## 2021-04-06 DIAGNOSIS — A498 Other bacterial infections of unspecified site: Secondary | ICD-10-CM | POA: Insufficient documentation

## 2021-04-06 DIAGNOSIS — J9819 Other pulmonary collapse: Secondary | ICD-10-CM | POA: Diagnosis present

## 2021-04-06 DIAGNOSIS — A419 Sepsis, unspecified organism: Secondary | ICD-10-CM | POA: Insufficient documentation

## 2021-04-06 DIAGNOSIS — Z79899 Other long term (current) drug therapy: Secondary | ICD-10-CM | POA: Diagnosis not present

## 2021-04-06 DIAGNOSIS — B9620 Unspecified Escherichia coli [E. coli] as the cause of diseases classified elsewhere: Secondary | ICD-10-CM | POA: Diagnosis present

## 2021-04-06 DIAGNOSIS — G4700 Insomnia, unspecified: Secondary | ICD-10-CM | POA: Diagnosis present

## 2021-04-06 DIAGNOSIS — Z604 Social exclusion and rejection: Secondary | ICD-10-CM | POA: Diagnosis present

## 2021-04-06 DIAGNOSIS — Z8673 Personal history of transient ischemic attack (TIA), and cerebral infarction without residual deficits: Secondary | ICD-10-CM | POA: Diagnosis not present

## 2021-04-06 DIAGNOSIS — Z86011 Personal history of benign neoplasm of the brain: Secondary | ICD-10-CM | POA: Diagnosis not present

## 2021-04-06 DIAGNOSIS — Z1624 Resistance to multiple antibiotics: Secondary | ICD-10-CM | POA: Diagnosis present

## 2021-04-06 DIAGNOSIS — N210 Calculus in bladder: Secondary | ICD-10-CM | POA: Diagnosis present

## 2021-04-06 DIAGNOSIS — Z1612 Extended spectrum beta lactamase (ESBL) resistance: Secondary | ICD-10-CM | POA: Insufficient documentation

## 2021-04-06 DIAGNOSIS — N2882 Megaloureter: Secondary | ICD-10-CM | POA: Diagnosis not present

## 2021-04-06 DIAGNOSIS — Z20822 Contact with and (suspected) exposure to covid-19: Secondary | ICD-10-CM | POA: Diagnosis present

## 2021-04-06 DIAGNOSIS — K7689 Other specified diseases of liver: Secondary | ICD-10-CM | POA: Diagnosis not present

## 2021-04-06 DIAGNOSIS — N179 Acute kidney failure, unspecified: Secondary | ICD-10-CM | POA: Diagnosis present

## 2021-04-06 DIAGNOSIS — R2681 Unsteadiness on feet: Secondary | ICD-10-CM | POA: Diagnosis present

## 2021-04-06 DIAGNOSIS — R7881 Bacteremia: Secondary | ICD-10-CM | POA: Insufficient documentation

## 2021-04-06 DIAGNOSIS — D696 Thrombocytopenia, unspecified: Secondary | ICD-10-CM | POA: Diagnosis present

## 2021-04-06 DIAGNOSIS — Z982 Presence of cerebrospinal fluid drainage device: Secondary | ICD-10-CM | POA: Diagnosis not present

## 2021-04-06 DIAGNOSIS — E785 Hyperlipidemia, unspecified: Secondary | ICD-10-CM | POA: Diagnosis present

## 2021-04-06 DIAGNOSIS — Z602 Problems related to living alone: Secondary | ICD-10-CM | POA: Diagnosis present

## 2021-04-06 DIAGNOSIS — A4151 Sepsis due to Escherichia coli [E. coli]: Secondary | ICD-10-CM | POA: Diagnosis present

## 2021-04-06 DIAGNOSIS — B9629 Other Escherichia coli [E. coli] as the cause of diseases classified elsewhere: Secondary | ICD-10-CM | POA: Diagnosis not present

## 2021-04-06 DIAGNOSIS — I10 Essential (primary) hypertension: Secondary | ICD-10-CM | POA: Diagnosis present

## 2021-04-06 DIAGNOSIS — N201 Calculus of ureter: Secondary | ICD-10-CM | POA: Diagnosis not present

## 2021-04-06 DIAGNOSIS — M62838 Other muscle spasm: Secondary | ICD-10-CM | POA: Diagnosis present

## 2021-04-06 DIAGNOSIS — N390 Urinary tract infection, site not specified: Secondary | ICD-10-CM | POA: Diagnosis present

## 2021-04-08 DIAGNOSIS — B9620 Unspecified Escherichia coli [E. coli] as the cause of diseases classified elsewhere: Secondary | ICD-10-CM | POA: Diagnosis present

## 2021-04-08 DIAGNOSIS — R918 Other nonspecific abnormal finding of lung field: Secondary | ICD-10-CM | POA: Diagnosis not present

## 2021-04-08 DIAGNOSIS — Z1612 Extended spectrum beta lactamase (ESBL) resistance: Secondary | ICD-10-CM | POA: Diagnosis present

## 2021-04-08 DIAGNOSIS — I10 Essential (primary) hypertension: Secondary | ICD-10-CM | POA: Diagnosis present

## 2021-04-08 DIAGNOSIS — N390 Urinary tract infection, site not specified: Secondary | ICD-10-CM | POA: Diagnosis present

## 2021-04-08 DIAGNOSIS — G4700 Insomnia, unspecified: Secondary | ICD-10-CM | POA: Diagnosis present

## 2021-04-08 DIAGNOSIS — Z982 Presence of cerebrospinal fluid drainage device: Secondary | ICD-10-CM | POA: Diagnosis not present

## 2021-04-08 DIAGNOSIS — E785 Hyperlipidemia, unspecified: Secondary | ICD-10-CM | POA: Diagnosis present

## 2021-04-08 DIAGNOSIS — Z8673 Personal history of transient ischemic attack (TIA), and cerebral infarction without residual deficits: Secondary | ICD-10-CM | POA: Diagnosis not present

## 2021-04-08 DIAGNOSIS — Z1624 Resistance to multiple antibiotics: Secondary | ICD-10-CM | POA: Diagnosis present

## 2021-04-08 DIAGNOSIS — R7881 Bacteremia: Secondary | ICD-10-CM | POA: Diagnosis present

## 2021-04-08 DIAGNOSIS — A419 Sepsis, unspecified organism: Secondary | ICD-10-CM | POA: Diagnosis not present

## 2021-04-08 DIAGNOSIS — B9629 Other Escherichia coli [E. coli] as the cause of diseases classified elsewhere: Secondary | ICD-10-CM | POA: Diagnosis not present

## 2021-04-15 DIAGNOSIS — C716 Malignant neoplasm of cerebellum: Secondary | ICD-10-CM | POA: Diagnosis not present

## 2021-04-15 DIAGNOSIS — G4700 Insomnia, unspecified: Secondary | ICD-10-CM | POA: Diagnosis not present

## 2021-04-15 DIAGNOSIS — Z982 Presence of cerebrospinal fluid drainage device: Secondary | ICD-10-CM | POA: Diagnosis not present

## 2021-04-15 DIAGNOSIS — I10 Essential (primary) hypertension: Secondary | ICD-10-CM | POA: Diagnosis not present

## 2021-04-15 DIAGNOSIS — B9620 Unspecified Escherichia coli [E. coli] as the cause of diseases classified elsewhere: Secondary | ICD-10-CM | POA: Diagnosis not present

## 2021-04-15 DIAGNOSIS — B9689 Other specified bacterial agents as the cause of diseases classified elsewhere: Secondary | ICD-10-CM | POA: Diagnosis not present

## 2021-04-15 DIAGNOSIS — N390 Urinary tract infection, site not specified: Secondary | ICD-10-CM | POA: Diagnosis not present

## 2021-04-15 DIAGNOSIS — E785 Hyperlipidemia, unspecified: Secondary | ICD-10-CM | POA: Diagnosis not present

## 2021-04-15 DIAGNOSIS — Z1612 Extended spectrum beta lactamase (ESBL) resistance: Secondary | ICD-10-CM | POA: Diagnosis not present

## 2021-04-17 DIAGNOSIS — B9620 Unspecified Escherichia coli [E. coli] as the cause of diseases classified elsewhere: Secondary | ICD-10-CM | POA: Diagnosis not present

## 2021-04-17 DIAGNOSIS — B9689 Other specified bacterial agents as the cause of diseases classified elsewhere: Secondary | ICD-10-CM | POA: Diagnosis not present

## 2021-04-17 DIAGNOSIS — Z1612 Extended spectrum beta lactamase (ESBL) resistance: Secondary | ICD-10-CM | POA: Diagnosis not present

## 2021-04-17 DIAGNOSIS — I10 Essential (primary) hypertension: Secondary | ICD-10-CM | POA: Diagnosis not present

## 2021-04-17 DIAGNOSIS — C716 Malignant neoplasm of cerebellum: Secondary | ICD-10-CM | POA: Diagnosis not present

## 2021-04-17 DIAGNOSIS — N390 Urinary tract infection, site not specified: Secondary | ICD-10-CM | POA: Diagnosis not present

## 2021-04-24 DIAGNOSIS — R9389 Abnormal findings on diagnostic imaging of other specified body structures: Secondary | ICD-10-CM | POA: Diagnosis not present

## 2021-04-24 DIAGNOSIS — R7881 Bacteremia: Secondary | ICD-10-CM | POA: Diagnosis not present

## 2021-04-24 DIAGNOSIS — N390 Urinary tract infection, site not specified: Secondary | ICD-10-CM | POA: Diagnosis not present

## 2021-04-24 DIAGNOSIS — D481 Neoplasm of uncertain behavior of connective and other soft tissue: Secondary | ICD-10-CM | POA: Diagnosis not present

## 2021-04-24 DIAGNOSIS — B9620 Unspecified Escherichia coli [E. coli] as the cause of diseases classified elsewhere: Secondary | ICD-10-CM | POA: Diagnosis not present

## 2021-04-24 DIAGNOSIS — N200 Calculus of kidney: Secondary | ICD-10-CM | POA: Diagnosis not present

## 2021-04-24 DIAGNOSIS — Z23 Encounter for immunization: Secondary | ICD-10-CM | POA: Diagnosis not present

## 2021-04-24 DIAGNOSIS — B9689 Other specified bacterial agents as the cause of diseases classified elsewhere: Secondary | ICD-10-CM | POA: Diagnosis not present

## 2021-04-24 DIAGNOSIS — Z6826 Body mass index (BMI) 26.0-26.9, adult: Secondary | ICD-10-CM | POA: Diagnosis not present

## 2021-04-24 DIAGNOSIS — Z1612 Extended spectrum beta lactamase (ESBL) resistance: Secondary | ICD-10-CM | POA: Diagnosis not present

## 2021-04-24 DIAGNOSIS — C716 Malignant neoplasm of cerebellum: Secondary | ICD-10-CM | POA: Diagnosis not present

## 2021-04-24 DIAGNOSIS — I10 Essential (primary) hypertension: Secondary | ICD-10-CM | POA: Diagnosis not present

## 2021-04-30 DIAGNOSIS — Z1612 Extended spectrum beta lactamase (ESBL) resistance: Secondary | ICD-10-CM | POA: Diagnosis not present

## 2021-04-30 DIAGNOSIS — C716 Malignant neoplasm of cerebellum: Secondary | ICD-10-CM | POA: Diagnosis not present

## 2021-04-30 DIAGNOSIS — B9620 Unspecified Escherichia coli [E. coli] as the cause of diseases classified elsewhere: Secondary | ICD-10-CM | POA: Diagnosis not present

## 2021-04-30 DIAGNOSIS — B9689 Other specified bacterial agents as the cause of diseases classified elsewhere: Secondary | ICD-10-CM | POA: Diagnosis not present

## 2021-04-30 DIAGNOSIS — N390 Urinary tract infection, site not specified: Secondary | ICD-10-CM | POA: Diagnosis not present

## 2021-04-30 DIAGNOSIS — I10 Essential (primary) hypertension: Secondary | ICD-10-CM | POA: Diagnosis not present

## 2021-05-02 DIAGNOSIS — B9689 Other specified bacterial agents as the cause of diseases classified elsewhere: Secondary | ICD-10-CM | POA: Diagnosis not present

## 2021-05-02 DIAGNOSIS — I10 Essential (primary) hypertension: Secondary | ICD-10-CM | POA: Diagnosis not present

## 2021-05-02 DIAGNOSIS — C716 Malignant neoplasm of cerebellum: Secondary | ICD-10-CM | POA: Diagnosis not present

## 2021-05-02 DIAGNOSIS — Z1612 Extended spectrum beta lactamase (ESBL) resistance: Secondary | ICD-10-CM | POA: Diagnosis not present

## 2021-05-02 DIAGNOSIS — B9620 Unspecified Escherichia coli [E. coli] as the cause of diseases classified elsewhere: Secondary | ICD-10-CM | POA: Diagnosis not present

## 2021-05-02 DIAGNOSIS — N390 Urinary tract infection, site not specified: Secondary | ICD-10-CM | POA: Diagnosis not present

## 2021-05-07 DIAGNOSIS — B9620 Unspecified Escherichia coli [E. coli] as the cause of diseases classified elsewhere: Secondary | ICD-10-CM | POA: Diagnosis not present

## 2021-05-07 DIAGNOSIS — Z1612 Extended spectrum beta lactamase (ESBL) resistance: Secondary | ICD-10-CM | POA: Diagnosis not present

## 2021-05-07 DIAGNOSIS — N390 Urinary tract infection, site not specified: Secondary | ICD-10-CM | POA: Diagnosis not present

## 2021-05-07 DIAGNOSIS — I10 Essential (primary) hypertension: Secondary | ICD-10-CM | POA: Diagnosis not present

## 2021-05-07 DIAGNOSIS — C716 Malignant neoplasm of cerebellum: Secondary | ICD-10-CM | POA: Diagnosis not present

## 2021-05-07 DIAGNOSIS — B9689 Other specified bacterial agents as the cause of diseases classified elsewhere: Secondary | ICD-10-CM | POA: Diagnosis not present

## 2021-05-08 DIAGNOSIS — N210 Calculus in bladder: Secondary | ICD-10-CM | POA: Diagnosis not present

## 2021-05-08 DIAGNOSIS — N401 Enlarged prostate with lower urinary tract symptoms: Secondary | ICD-10-CM | POA: Diagnosis not present

## 2021-05-08 DIAGNOSIS — N390 Urinary tract infection, site not specified: Secondary | ICD-10-CM | POA: Diagnosis not present

## 2021-05-08 DIAGNOSIS — Z79899 Other long term (current) drug therapy: Secondary | ICD-10-CM | POA: Diagnosis not present

## 2021-05-12 DIAGNOSIS — M47816 Spondylosis without myelopathy or radiculopathy, lumbar region: Secondary | ICD-10-CM | POA: Diagnosis not present

## 2021-05-12 DIAGNOSIS — N201 Calculus of ureter: Secondary | ICD-10-CM | POA: Diagnosis not present

## 2021-05-12 DIAGNOSIS — N210 Calculus in bladder: Secondary | ICD-10-CM | POA: Diagnosis not present

## 2021-05-12 DIAGNOSIS — M160 Bilateral primary osteoarthritis of hip: Secondary | ICD-10-CM | POA: Diagnosis not present

## 2021-05-12 DIAGNOSIS — N3289 Other specified disorders of bladder: Secondary | ICD-10-CM | POA: Diagnosis not present

## 2021-05-12 DIAGNOSIS — I878 Other specified disorders of veins: Secondary | ICD-10-CM | POA: Diagnosis not present

## 2021-05-13 DIAGNOSIS — Z1612 Extended spectrum beta lactamase (ESBL) resistance: Secondary | ICD-10-CM | POA: Diagnosis not present

## 2021-05-13 DIAGNOSIS — B9620 Unspecified Escherichia coli [E. coli] as the cause of diseases classified elsewhere: Secondary | ICD-10-CM | POA: Diagnosis not present

## 2021-05-13 DIAGNOSIS — B9689 Other specified bacterial agents as the cause of diseases classified elsewhere: Secondary | ICD-10-CM | POA: Diagnosis not present

## 2021-05-13 DIAGNOSIS — C716 Malignant neoplasm of cerebellum: Secondary | ICD-10-CM | POA: Diagnosis not present

## 2021-05-13 DIAGNOSIS — N390 Urinary tract infection, site not specified: Secondary | ICD-10-CM | POA: Diagnosis not present

## 2021-05-13 DIAGNOSIS — I10 Essential (primary) hypertension: Secondary | ICD-10-CM | POA: Diagnosis not present

## 2021-05-15 DIAGNOSIS — E785 Hyperlipidemia, unspecified: Secondary | ICD-10-CM | POA: Diagnosis not present

## 2021-05-15 DIAGNOSIS — B9689 Other specified bacterial agents as the cause of diseases classified elsewhere: Secondary | ICD-10-CM | POA: Diagnosis not present

## 2021-05-15 DIAGNOSIS — Z1612 Extended spectrum beta lactamase (ESBL) resistance: Secondary | ICD-10-CM | POA: Diagnosis not present

## 2021-05-15 DIAGNOSIS — I10 Essential (primary) hypertension: Secondary | ICD-10-CM | POA: Diagnosis not present

## 2021-05-15 DIAGNOSIS — N390 Urinary tract infection, site not specified: Secondary | ICD-10-CM | POA: Diagnosis not present

## 2021-05-15 DIAGNOSIS — B9620 Unspecified Escherichia coli [E. coli] as the cause of diseases classified elsewhere: Secondary | ICD-10-CM | POA: Diagnosis not present

## 2021-05-15 DIAGNOSIS — C716 Malignant neoplasm of cerebellum: Secondary | ICD-10-CM | POA: Diagnosis not present

## 2021-05-15 DIAGNOSIS — Z982 Presence of cerebrospinal fluid drainage device: Secondary | ICD-10-CM | POA: Diagnosis not present

## 2021-05-15 DIAGNOSIS — G4700 Insomnia, unspecified: Secondary | ICD-10-CM | POA: Diagnosis not present

## 2021-05-19 DIAGNOSIS — N390 Urinary tract infection, site not specified: Secondary | ICD-10-CM | POA: Diagnosis not present

## 2021-05-19 DIAGNOSIS — B9689 Other specified bacterial agents as the cause of diseases classified elsewhere: Secondary | ICD-10-CM | POA: Diagnosis not present

## 2021-05-19 DIAGNOSIS — C716 Malignant neoplasm of cerebellum: Secondary | ICD-10-CM | POA: Diagnosis not present

## 2021-05-19 DIAGNOSIS — Z1612 Extended spectrum beta lactamase (ESBL) resistance: Secondary | ICD-10-CM | POA: Diagnosis not present

## 2021-05-19 DIAGNOSIS — I10 Essential (primary) hypertension: Secondary | ICD-10-CM | POA: Diagnosis not present

## 2021-05-19 DIAGNOSIS — B9620 Unspecified Escherichia coli [E. coli] as the cause of diseases classified elsewhere: Secondary | ICD-10-CM | POA: Diagnosis not present

## 2021-05-28 DIAGNOSIS — E782 Mixed hyperlipidemia: Secondary | ICD-10-CM | POA: Diagnosis not present

## 2021-05-28 DIAGNOSIS — G919 Hydrocephalus, unspecified: Secondary | ICD-10-CM | POA: Diagnosis not present

## 2021-05-28 DIAGNOSIS — Z6827 Body mass index (BMI) 27.0-27.9, adult: Secondary | ICD-10-CM | POA: Diagnosis not present

## 2021-05-28 DIAGNOSIS — D481 Neoplasm of uncertain behavior of connective and other soft tissue: Secondary | ICD-10-CM | POA: Diagnosis not present

## 2021-05-28 DIAGNOSIS — I10 Essential (primary) hypertension: Secondary | ICD-10-CM | POA: Diagnosis not present

## 2021-05-28 DIAGNOSIS — I639 Cerebral infarction, unspecified: Secondary | ICD-10-CM | POA: Diagnosis not present

## 2021-05-28 DIAGNOSIS — E1169 Type 2 diabetes mellitus with other specified complication: Secondary | ICD-10-CM | POA: Diagnosis not present

## 2021-05-28 DIAGNOSIS — G4700 Insomnia, unspecified: Secondary | ICD-10-CM | POA: Diagnosis not present

## 2021-05-29 DIAGNOSIS — N3289 Other specified disorders of bladder: Secondary | ICD-10-CM | POA: Diagnosis not present

## 2021-05-29 DIAGNOSIS — N210 Calculus in bladder: Secondary | ICD-10-CM | POA: Diagnosis not present

## 2021-05-30 DIAGNOSIS — B9689 Other specified bacterial agents as the cause of diseases classified elsewhere: Secondary | ICD-10-CM | POA: Diagnosis not present

## 2021-05-30 DIAGNOSIS — B9620 Unspecified Escherichia coli [E. coli] as the cause of diseases classified elsewhere: Secondary | ICD-10-CM | POA: Diagnosis not present

## 2021-05-30 DIAGNOSIS — C716 Malignant neoplasm of cerebellum: Secondary | ICD-10-CM | POA: Diagnosis not present

## 2021-05-30 DIAGNOSIS — N390 Urinary tract infection, site not specified: Secondary | ICD-10-CM | POA: Diagnosis not present

## 2021-05-30 DIAGNOSIS — Z1612 Extended spectrum beta lactamase (ESBL) resistance: Secondary | ICD-10-CM | POA: Diagnosis not present

## 2021-05-30 DIAGNOSIS — I10 Essential (primary) hypertension: Secondary | ICD-10-CM | POA: Diagnosis not present

## 2021-06-02 ENCOUNTER — Other Ambulatory Visit: Payer: Self-pay | Admitting: Neurosurgery

## 2021-06-02 DIAGNOSIS — D432 Neoplasm of uncertain behavior of brain, unspecified: Secondary | ICD-10-CM

## 2021-06-03 DIAGNOSIS — B9620 Unspecified Escherichia coli [E. coli] as the cause of diseases classified elsewhere: Secondary | ICD-10-CM | POA: Diagnosis not present

## 2021-06-03 DIAGNOSIS — Z1612 Extended spectrum beta lactamase (ESBL) resistance: Secondary | ICD-10-CM | POA: Diagnosis not present

## 2021-06-03 DIAGNOSIS — I10 Essential (primary) hypertension: Secondary | ICD-10-CM | POA: Diagnosis not present

## 2021-06-03 DIAGNOSIS — N390 Urinary tract infection, site not specified: Secondary | ICD-10-CM | POA: Diagnosis not present

## 2021-06-03 DIAGNOSIS — B9689 Other specified bacterial agents as the cause of diseases classified elsewhere: Secondary | ICD-10-CM | POA: Diagnosis not present

## 2021-06-03 DIAGNOSIS — C716 Malignant neoplasm of cerebellum: Secondary | ICD-10-CM | POA: Diagnosis not present

## 2021-06-09 DIAGNOSIS — N401 Enlarged prostate with lower urinary tract symptoms: Secondary | ICD-10-CM | POA: Diagnosis not present

## 2021-06-09 DIAGNOSIS — N201 Calculus of ureter: Secondary | ICD-10-CM | POA: Diagnosis not present

## 2021-06-09 DIAGNOSIS — N390 Urinary tract infection, site not specified: Secondary | ICD-10-CM | POA: Diagnosis not present

## 2021-06-09 DIAGNOSIS — N210 Calculus in bladder: Secondary | ICD-10-CM | POA: Diagnosis not present

## 2021-06-09 DIAGNOSIS — R9341 Abnormal radiologic findings on diagnostic imaging of renal pelvis, ureter, or bladder: Secondary | ICD-10-CM | POA: Diagnosis not present

## 2021-06-27 DIAGNOSIS — Z79899 Other long term (current) drug therapy: Secondary | ICD-10-CM | POA: Diagnosis not present

## 2021-06-27 DIAGNOSIS — E7800 Pure hypercholesterolemia, unspecified: Secondary | ICD-10-CM | POA: Diagnosis not present

## 2021-06-27 DIAGNOSIS — Z982 Presence of cerebrospinal fluid drainage device: Secondary | ICD-10-CM | POA: Diagnosis not present

## 2021-06-27 DIAGNOSIS — I10 Essential (primary) hypertension: Secondary | ICD-10-CM | POA: Diagnosis not present

## 2021-06-27 DIAGNOSIS — N201 Calculus of ureter: Secondary | ICD-10-CM | POA: Diagnosis not present

## 2021-06-29 ENCOUNTER — Other Ambulatory Visit: Payer: Self-pay

## 2021-06-29 ENCOUNTER — Other Ambulatory Visit: Payer: Self-pay | Admitting: Neurosurgery

## 2021-06-29 ENCOUNTER — Ambulatory Visit: Admission: RE | Admit: 2021-06-29 | Payer: Medicare Other | Source: Ambulatory Visit

## 2021-06-29 DIAGNOSIS — D432 Neoplasm of uncertain behavior of brain, unspecified: Secondary | ICD-10-CM

## 2021-07-02 ENCOUNTER — Other Ambulatory Visit: Payer: Self-pay

## 2021-07-02 ENCOUNTER — Ambulatory Visit
Admission: RE | Admit: 2021-07-02 | Discharge: 2021-07-02 | Disposition: A | Discharge status: Home / Self Care | Payer: Medicare Other | Source: Ambulatory Visit | Attending: Neurosurgery | Admitting: Neurosurgery

## 2021-07-02 DIAGNOSIS — D432 Neoplasm of uncertain behavior of brain, unspecified: Secondary | ICD-10-CM

## 2021-07-02 DIAGNOSIS — G919 Hydrocephalus, unspecified: Secondary | ICD-10-CM | POA: Diagnosis not present

## 2021-07-02 DIAGNOSIS — I10 Essential (primary) hypertension: Secondary | ICD-10-CM | POA: Diagnosis not present

## 2021-07-02 DIAGNOSIS — Z6826 Body mass index (BMI) 26.0-26.9, adult: Secondary | ICD-10-CM | POA: Diagnosis not present

## 2021-07-02 DIAGNOSIS — R220 Localized swelling, mass and lump, head: Secondary | ICD-10-CM | POA: Diagnosis not present

## 2021-07-02 IMAGING — MR MR HEAD WO/W CM
14 series · 48 of 48 positions shown · IV contrast (multihance)
Comparison: MRI [DATE].
COMPARISON: MRI [DATE].

Addendum:
CLINICAL DATA: Hemangioblastoma of brain (HCC) [H5] ([H5]-CM)
[DATE].

EXAM:
MRI HEAD WITHOUT AND WITH CONTRAST
TECHNIQUE: Multiplanar, multiecho pulse sequences of the brain and surrounding
structures were obtained without and with intravenous contrast.
CONTRAST:  15mL MULTIHANCE GADOBENATE DIMEGLUMINE 529 MG/ML IV SOLN

[Series 5: T1 · sagittal · 4.0mm · 0.75mm/px · 2 of 31 slices shown (1 of 3)]
[im 1/31]
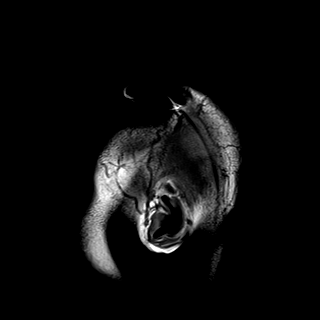
[im 31/31]
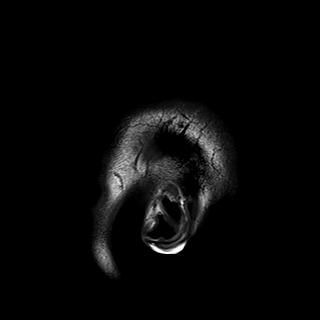

[Series 6: DWI · axial · 3.0mm · 0.94mm/px · z∈[-100,+65]mm · 10 of 188 slices shown (1 of 3)]
[im 1/188]
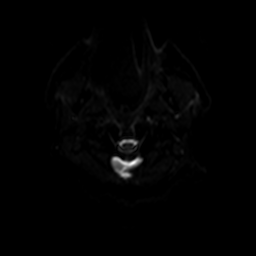
[im 21/188]
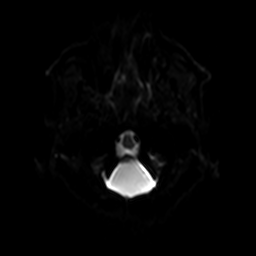
[im 42/188]
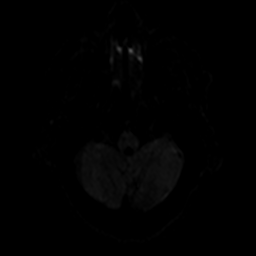
[im 63/188]
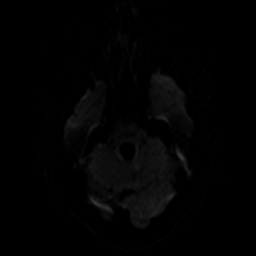
[im 84/188]
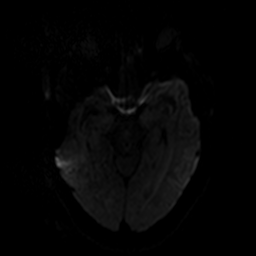
[im 104/188]
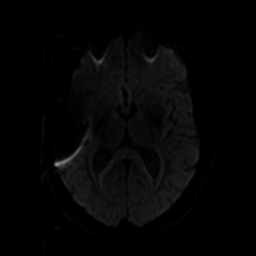
[im 125/188]
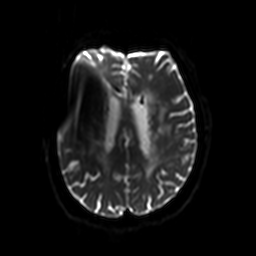
[im 146/188]
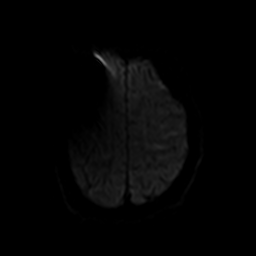
[im 167/188]
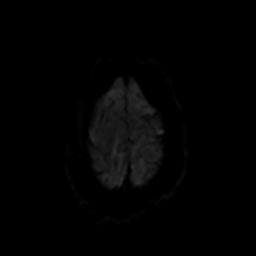
[im 188/188]
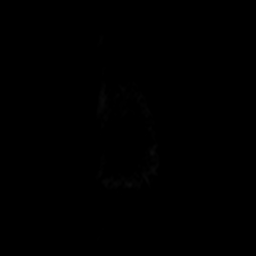

[Series 7: ax dwi_tracew · axial · 3.0mm · 0.94mm/px · z∈[-100,+65]mm · 4 of 94 slices shown]
[im 1/94]
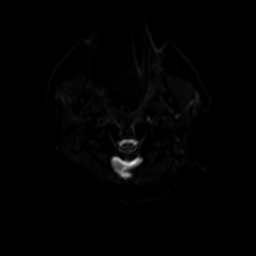
[im 32/94]
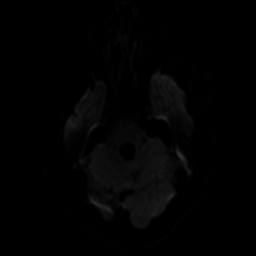
[im 63/94]
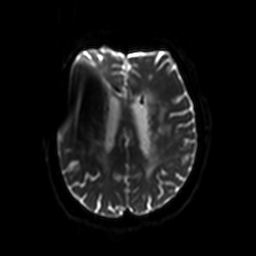
[im 94/94]
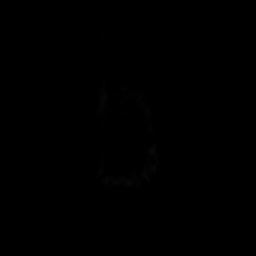

[Series 8: ax dwi_adc · axial · 3.0mm · 0.94mm/px · z∈[-100,+65]mm · 2 of 47 slices shown]
[im 1/47]
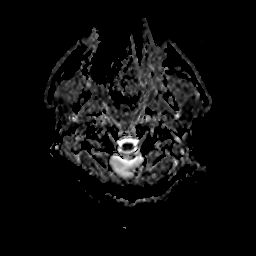
[im 47/47]
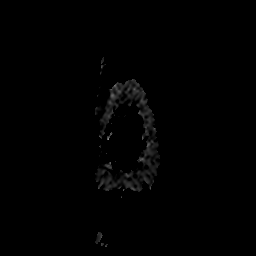

[Series 9: DWI · coronal · 5.0mm · 1.44mm/px · 3 of 68 slices shown (2 of 3)]
[im 1/68]
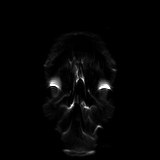
[im 34/68]
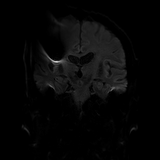
[im 68/68]
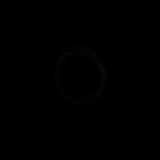

[Series 10: DWI · coronal · 5.0mm · 1.44mm/px · 2 of 34 slices shown (3 of 3)]
[im 1/34]
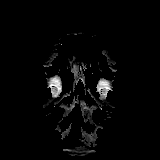
[im 34/34]
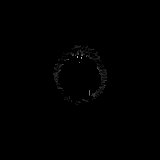

[Series 11: T2 · axial · 4.0mm · 0.36mm/px · 1 of 30 slices shown]
[im 1/30]
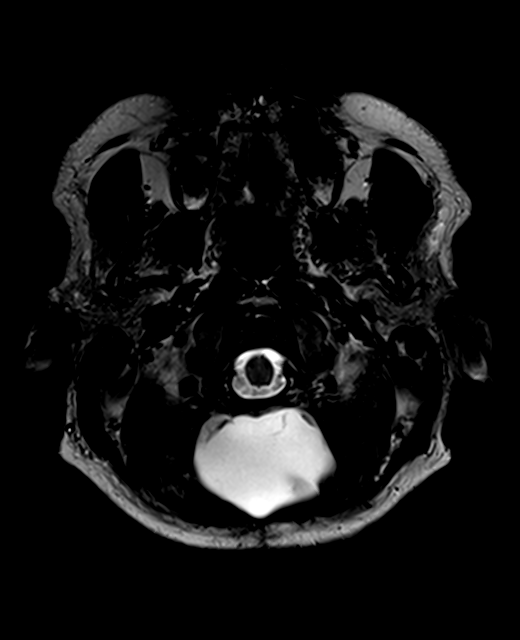

[Series 12: FLAIR · axial · 3.0mm · 0.72mm/px · 1 of 26 slices shown]
[im 1/26]
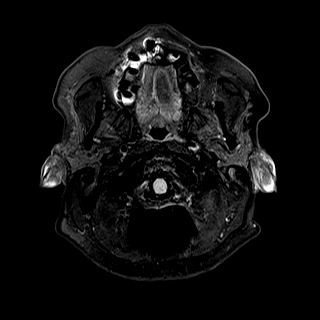

[Series 13: swi_images · axial · 1.5mm · 0.90mm/px · z∈[-75,+67]mm · 4 of 96 slices shown]
[im 1/96]
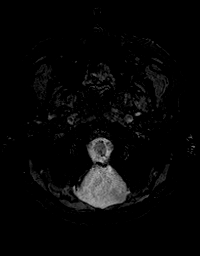
[im 32/96]
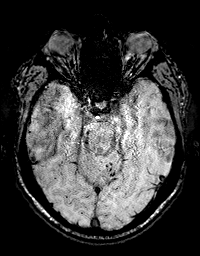
[im 64/96]
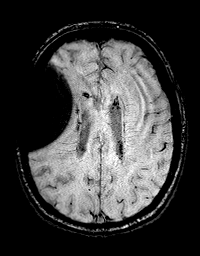
[im 96/96]
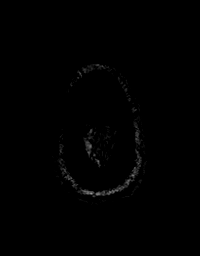

[Series 15: T1 · axial · 1.0mm · 0.94mm/px · z∈[-91,+68]mm · 7 of 160 slices shown (2 of 3)]
[im 1/160]
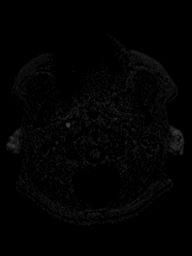
[im 27/160]
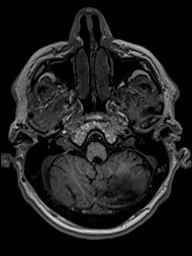
[im 54/160]
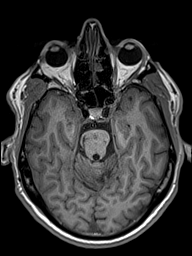
[im 80/160]
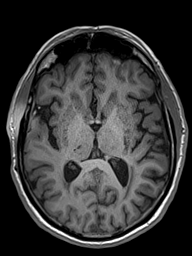
[im 107/160]
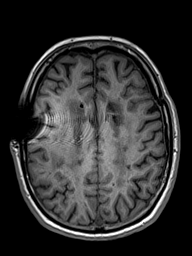
[im 133/160]
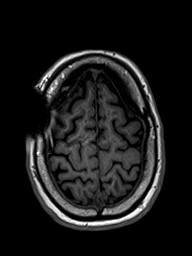
[im 160/160]
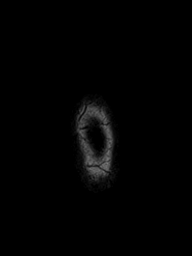

[Series 16: T2 post-contrast · coronal · 4.5mm · 0.36mm/px · 2 of 35 slices shown]
[im 1/35]
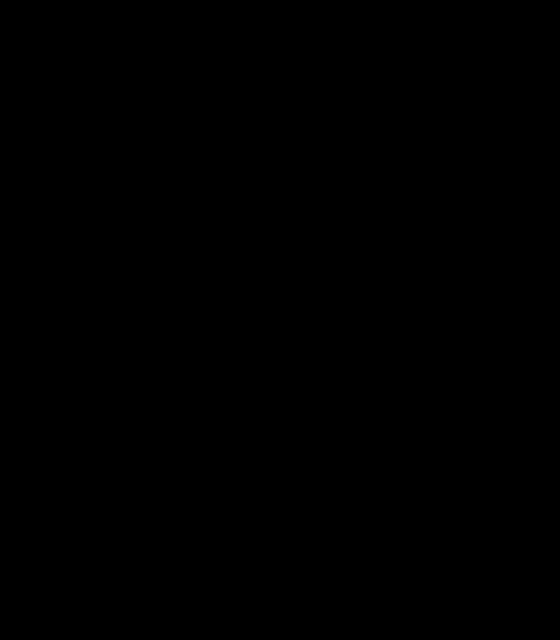
[im 35/35]
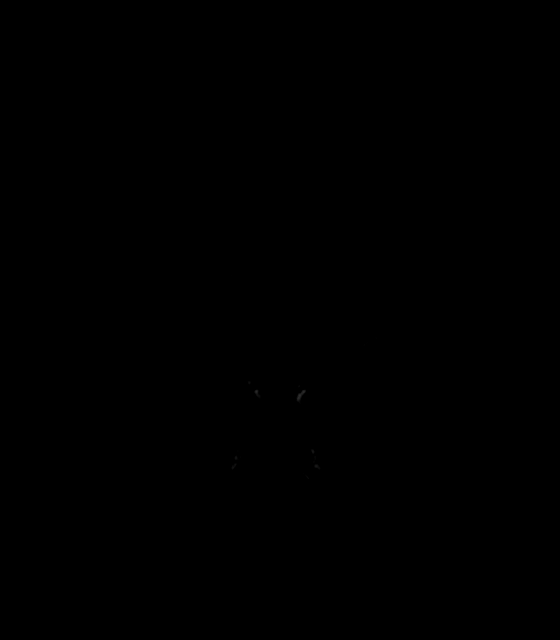

[Series 17: T1 · axial · 1.0mm · 0.94mm/px · z∈[-91,+68]mm · 7 of 160 slices shown (3 of 3)]
[im 1/160]
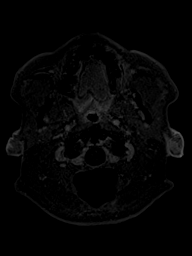
[im 27/160]
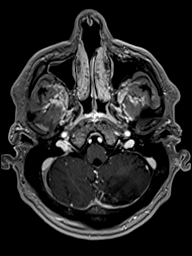
[im 54/160]
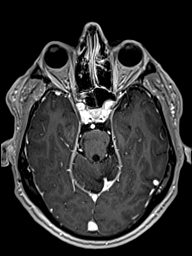
[im 80/160]
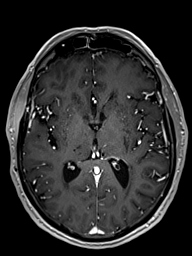
[im 107/160]
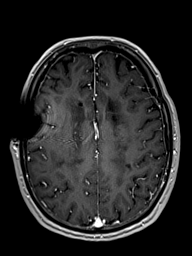
[im 133/160]
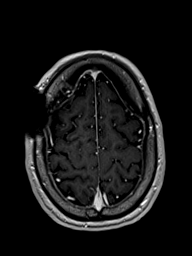
[im 160/160]
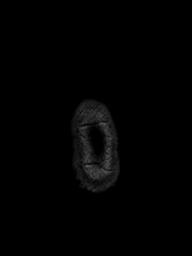

[Series 18: T1 post-contrast · coronal · 4.5mm · 0.72mm/px · 2 of 35 slices shown (1 of 2)]
[im 1/35]
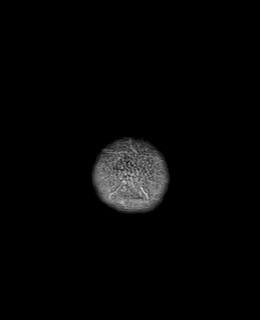
[im 35/35]
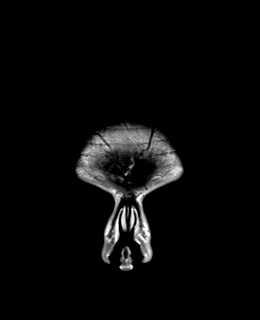

[Series 19: T1 post-contrast · sagittal · 4.0mm · 0.75mm/px · 1 of 31 slices shown (2 of 2)]
[im 1/31]
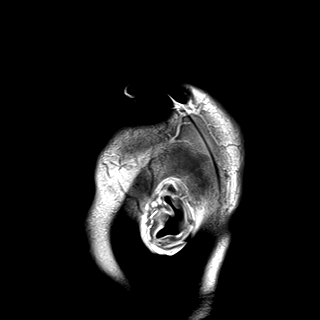

[48 of 48 positions shown; findings below may reference images not displayed]

FINDINGS: Brain: Slightly increased size/bulk of nodular enhancement along the
deep and superior margin resection cavity, measuring approximately
11 x 8 by 9 mm on series 17, image 59 and series 18, image 12
(previously 10 x 7 by 6 mm). No substantial change in wispy/linear
enhancement more posteriorly, likely postsurgical. Ill-defined
T2/FLAIR hyperintensity within the left cerebellum is mildly
increased, probably related to treatment related change given
associated volume loss and increased ex vacuo dilation of the fourth
ventricle. Suboccipital scalp fluid collection is slightly increased
in size, measuring 5.1 x 3.9 cm. Redemonstrated remote lacunar
infarcts in the supratentorial white matter and pons. Also, similar
supratentorial patchy and confluent T2/FLAIR hyperintensity,
nonspecific but likely related to chronic microvascular ischemic
disease. Right frontal ventriculostomy catheter with mildly
increased size of the lateral and third ventricle. No evidence of
acute hemorrhage or midline shift. DWI is limited along the right
frontal convexity due to artifact from the ventriculostomy catheter
without visible acute infarct. Small dilated perivascular spaces in
the basal ganglia.

Vascular: Major arterial flow voids are maintained at the skull
base.

Skull and upper cervical spine: Normal marrow signal.

Sinuses/Orbits: Mild paranasal sinus mucosal thickening. No acute
orbital findings.

Other: No mastoid effusions.
IMPRESSION: 1. Slightly increased size/bulk of nodular enhancement along the
deep and superior margin of resection cavity, detailed above and
compatible with slowly progressive residual tumor.
2. Right frontal ventriculostomy catheter with mildly increased size
of the lateral and third ventricle. Shunt malfunction is not
excluded.
3. Mildly increased ill-defined T2/FLAIR hyperintensity in the left
cerebellum, probably related to post-treatment change given evidence
of associated progressive volume loss. Recommend attention on
follow-up.
4. Mildly increased suboccipital scalp fluid collection, likely a
pseudomeningocele.

ADDENDUM:
Findings discussed with Dr JIM via telephone at [DATE] a.m.

*** End of Addendum ***
FINDINGS: Brain: Slightly increased size/bulk of nodular enhancement along the
deep and superior margin resection cavity, measuring approximately
11 x 8 by 9 mm on series 17, image 59 and series 18, image 12
(previously 10 x 7 by 6 mm). No substantial change in wispy/linear
enhancement more posteriorly, likely postsurgical. Ill-defined
T2/FLAIR hyperintensity within the left cerebellum is mildly
increased, probably related to treatment related change given
associated volume loss and increased ex vacuo dilation of the fourth
ventricle. Suboccipital scalp fluid collection is slightly increased
in size, measuring 5.1 x 3.9 cm. Redemonstrated remote lacunar
infarcts in the supratentorial white matter and pons. Also, similar
supratentorial patchy and confluent T2/FLAIR hyperintensity,
nonspecific but likely related to chronic microvascular ischemic
disease. Right frontal ventriculostomy catheter with mildly
increased size of the lateral and third ventricle. No evidence of
acute hemorrhage or midline shift. DWI is limited along the right
frontal convexity due to artifact from the ventriculostomy catheter
without visible acute infarct. Small dilated perivascular spaces in
the basal ganglia.

Vascular: Major arterial flow voids are maintained at the skull
base.

Skull and upper cervical spine: Normal marrow signal.

Sinuses/Orbits: Mild paranasal sinus mucosal thickening. No acute
orbital findings.

Other: No mastoid effusions.
IMPRESSION: 1. Slightly increased size/bulk of nodular enhancement along the
deep and superior margin of resection cavity, detailed above and
compatible with slowly progressive residual tumor.
2. Right frontal ventriculostomy catheter with mildly increased size
of the lateral and third ventricle. Shunt malfunction is not
excluded.
3. Mildly increased ill-defined T2/FLAIR hyperintensity in the left
cerebellum, probably related to post-treatment change given evidence
of associated progressive volume loss. Recommend attention on
follow-up.
4. Mildly increased suboccipital scalp fluid collection, likely a
pseudomeningocele.

## 2021-07-02 MED ORDER — GADOBENATE DIMEGLUMINE 529 MG/ML IV SOLN
15.0000 mL | Freq: Once | INTRAVENOUS | Status: AC | PRN
  Administered 2021-07-02: 15 mL via INTRAVENOUS

## 2021-07-03 ENCOUNTER — Other Ambulatory Visit: Payer: Self-pay

## 2021-07-16 DIAGNOSIS — D432 Neoplasm of uncertain behavior of brain, unspecified: Secondary | ICD-10-CM | POA: Diagnosis not present

## 2021-07-17 ENCOUNTER — Other Ambulatory Visit: Payer: Self-pay | Admitting: Radiation Therapy

## 2021-07-17 DIAGNOSIS — D432 Neoplasm of uncertain behavior of brain, unspecified: Secondary | ICD-10-CM

## 2021-07-22 NOTE — Progress Notes (Signed)
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 Kathyrn Sheriff      Past or anticipated interventions, if any, per medical oncology:   Dose of Decadron, if applicable:  None  Recent neurologic symptoms, if any:  Seizures: No Headaches:  No Nausea: No  Dizziness/ataxia: Once in a while Difficulty with hand coordination:  Sometimes Focal numbness/weakness: No Visual deficits/changes:  A little bit  Confusion/Memory deficits:  Yes  Painful bone metastases at present, if any:   SAFETY ISSUES: Prior radiation?  No Pacemaker/ICD?  No Possible current pregnancy?  Male Is the patient on methotrexate?  No  Additional Complaints / other details:

## 2021-07-23 ENCOUNTER — Ambulatory Visit: Payer: Medicare Other | Admitting: Radiation Oncology

## 2021-07-23 ENCOUNTER — Ambulatory Visit: Payer: Medicare Other

## 2021-07-25 ENCOUNTER — Ambulatory Visit
Admission: RE | Admit: 2021-07-25 | Discharge: 2021-07-25 | Disposition: A | Discharge status: Home / Self Care | Payer: Medicare Other | Source: Ambulatory Visit | Attending: Radiation Oncology | Admitting: Radiation Oncology

## 2021-07-25 ENCOUNTER — Other Ambulatory Visit: Payer: Self-pay

## 2021-07-25 VITALS — BP 132/78 | HR 64 | Temp 97.8°F | Resp 20 | Ht 68.5 in | Wt 177.0 lb

## 2021-07-25 DIAGNOSIS — D432 Neoplasm of uncertain behavior of brain, unspecified: Secondary | ICD-10-CM | POA: Insufficient documentation

## 2021-07-25 DIAGNOSIS — D496 Neoplasm of unspecified behavior of brain: Secondary | ICD-10-CM | POA: Insufficient documentation

## 2021-07-25 DIAGNOSIS — I10 Essential (primary) hypertension: Secondary | ICD-10-CM | POA: Diagnosis not present

## 2021-07-25 DIAGNOSIS — D330 Benign neoplasm of brain, supratentorial: Secondary | ICD-10-CM | POA: Diagnosis not present

## 2021-07-25 DIAGNOSIS — K219 Gastro-esophageal reflux disease without esophagitis: Secondary | ICD-10-CM | POA: Diagnosis not present

## 2021-07-25 DIAGNOSIS — Z79899 Other long term (current) drug therapy: Secondary | ICD-10-CM | POA: Diagnosis not present

## 2021-07-25 NOTE — Progress Notes (Signed)
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    ICD-10-CM   1. Cerebellar tumor ([place])  D49.6       HISTORY OF PRESENT ILLNESS: Jose Ramirez is a 70 y.o. male seen at the request of Dr. Kathyrn Sheriff. He initially presented with vertigo in 02/2020. He was referred to Dr. Rexene Alberts and underwent brain MRI on 05/09/20 showing a posterior fossa cerebellar multicystic lesion with mural enhancing nodule. He subsequently underwent posterior fossa craniotomy for resection on 06/07/20 under the care of Dr. 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 Unfortunately, post-procedure brain MRI showed an 8 mm nodular component of contrast enhancement at the superior aspect of the resection cavity.  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  He has been monitored with serial brain MRI scans since that time.  His most recent surveillance brain MRI on 07/02/21 showed slightly increased size/bulk of nodular enhancement along the deep and superior margin of the resection cavity, measuring 11 x 8 x 9 mm as compared to 10 x 7 x 6 mm on previous scan from April 2022, compatible with slowly-progressive residual tumor.  Also noted was a right frontal ventriculostomy catheter with mildly increased size of lateral and third ventricle, shunt malfunction not excluded.  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  His case and imaging were reviewed in the recent multidisciplinary brain conference and consensus recommendation was to proceed with stereotactic radiosurgery in the management of his recurrent disease.  He has therefore been kindly referred today to discuss the potential role of radiotherapy.  PREVIOUS RADIATION THERAPY: No  PAST MEDICAL HISTORY:  Past Medical History:  Diagnosis Date   GERD (gastroesophageal reflux disease)    Hypertension       PAST SURGICAL HISTORY: Past Surgical History:  Procedure Laterality Date   CRANIOTOMY N/A 06/07/2020   Procedure: SUBOCCIPITAL CRANIECTOMY FOR RESECTION OF CEREBELLAR TUMOR;  Surgeon: Consuella Lose, MD;  Location: [facility];  Service: Neurosurgery;  Laterality: N/A;  SUBOCCIPITAL CRANIECTOMY FOR RESECTION OF CEREBELLAR TUMOR   LAPAROSCOPIC REVISION VENTRICULAR-PERITONEAL (V-P) SHUNT N/A 08/29/2020   Procedure: LAPAROSCOPIC REVISION VENTRICULAR-PERITONEAL (V-P) SHUNT;  Surgeon: Dwan Bolt, MD;  Location: [facility];  Service: General;  Laterality: N/A;   VENTRICULOPERITONEAL SHUNT N/A 08/29/2020   Procedure: SHUNT INSERTION VENTRICULAR-PERITONEAL;  Surgeon: Consuella Lose, MD;  Location: [facility];  Service: Neurosurgery;  Laterality: N/A;    FAMILY HISTORY: No family history on file.  SOCIAL HISTORY:  Social History   Socioeconomic History   Marital status: Single    Spouse name: Not on file   Number of children: Not on file   Years of education: Not on file   Highest education level: Not on file  Occupational History   Not on file  Tobacco Use   Smoking status: Never   Smokeless tobacco: Never  Vaping Use   Vaping Use: Never used  Substance and Sexual Activity   Alcohol use: Not Currently  Drug use: Not Currently    Comment: Last used 20-30 years ago    Sexual activity: Not on file  Other Topics Concern   Not on file  Social History Narrative    Not on file   Social Determinants of Health   Financial Resource Strain: Not on file  Food Insecurity: Not on file  Transportation Needs: Not on file  Physical Activity: Not on file  Stress: Not on file  Social Connections: Not on file  Intimate Partner Violence: Not on file    ALLERGIES: Patient has no known allergies.  MEDICATIONS:  Current Outpatient Medications  Medication Sig Dispense Refill   tiZANidine (ZANAFLEX) 4 MG tablet TAKE 1 (ONE) TABLET BY MOUTH 3 TIMES A DAY AS NEEDED FOR MUSCLE PAIN/SPASM     docusate sodium (COLACE) 100 MG capsule Take 100 mg by mouth.     ibuprofen (ADVIL) 800 MG tablet Take 800 mg by mouth 3 (three) times daily.     meclizine (ANTIVERT) 25 MG tablet Take 1 tablet (25 mg total) by mouth 2 (two) times daily as needed for dizziness or nausea. 60 tablet 0   melatonin 5 MG TABS Take 5 mg by mouth at bedtime.     metoprolol tartrate (LOPRESSOR) 50 MG tablet Take 1 tablet (50 mg total) by mouth 2 (two) times daily. 60 tablet 0   metoprolol tartrate (LOPRESSOR) 50 MG tablet Take by mouth.     Multiple Vitamin (MULTIVITAMIN WITH MINERALS) TABS tablet Take 1 tablet by mouth daily.     nystatin cream (MYCOSTATIN) Apply topically.     Omega-3 Fatty Acids (FISH OIL) 1000 MG CAPS Take 2,000 mg by mouth in the morning and at bedtime.     omeprazole (PRILOSEC) 20 MG capsule Take by mouth.     ondansetron (ZOFRAN) 4 MG tablet Take 1 tablet (4 mg total) by mouth daily as needed for nausea or vomiting. 30 tablet 0   prochlorperazine (COMPAZINE) 10 MG tablet Take by mouth.     simvastatin (ZOCOR) 40 MG tablet Take 1 tablet (40 mg total) by mouth at bedtime. 30 tablet 0   tamsulosin (FLOMAX) 0.4 MG CAPS capsule SMARTSIG:1 Capsule(s) By Mouth Every Evening     traZODone (DESYREL) 150 MG tablet Take 150 mg by mouth at bedtime.     No current facility-administered medications for this encounter.    REVIEW OF SYSTEMS:  On review of systems, the patient reports  that he is doing well overall. He denies any chest pain, shortness of breath, cough, fevers, chills, night sweats, unintended weight changes. He denies any bowel or bladder disturbances, and denies abdominal pain, nausea or vomiting. He denies any new musculoskeletal or joint aches or pains. He reports occasional dizziness, some difficulty with hand coordination, some mild visual deficits, and confusion/memory deficits. A complete review of systems is obtained and is otherwise negative.    PHYSICAL EXAM:  Wt Readings from Last 3 Encounters:  07/25/21 177 lb (80.3 kg)  09/24/20 168 lb 12.8 oz (76.6 kg)  09/17/20 165 lb 6.4 oz (75 kg)   Temp Readings from Last 3 Encounters:  07/25/21 97.8 F (36.6 C)  09/24/20 97.9 F (36.6 C) (Oral)  09/17/20 (!) 97.3 F (36.3 C) (Oral)   BP Readings from Last 3 Encounters:  07/25/21 132/78  09/24/20 128/80  09/17/20 120/83   Pulse Readings from Last 3 Encounters:  07/25/21 64  09/24/20 74  09/17/20 75   Pain Assessment Pain Score: 10-Worst pain ever (neck  pain taking advil)/10  In general this is a well appearing Caucasian male in no acute distress. He's alert and oriented x4 and appropriate throughout the examination. Cardiopulmonary assessment is negative for acute distress and he exhibits normal effort.     KPS = 90  100 - Normal; no complaints; no evidence of disease. 90   - Able to carry on normal activity; minor signs or symptoms of disease. 80   - Normal activity with effort; some signs or symptoms of disease. 12   - Cares for self; unable to carry on normal activity or to do active work. 60   - Requires occasional assistance, but is able to care for most of his personal needs. 50   - Requires considerable assistance and frequent medical care. 32   - Disabled; requires special care and assistance. 80   - Severely disabled; hospital admission is indicated although death not imminent. 2   - Very sick; hospital admission necessary;  active supportive treatment necessary. 10   - Moribund; fatal processes progressing rapidly. 0     - Dead  Karnofsky DA, Abelmann [place], Craver LS and Burchenal JH [phone]) The use of the nitrogen mustards in the palliative treatment of carcinoma: with particular reference to bronchogenic carcinoma Cancer 1 [phone]  LABORATORY DATA:  Lab Results  Component Value Date   WBC 11.1 09/13/2020   HGB 14.1 09/13/2020   HCT 41 09/13/2020   MCV 90.9 08/30/2020   PLT 316 09/13/2020   Lab Results  Component Value Date   NA 141 09/13/2020   K 4.4 09/13/2020   CL 102 09/13/2020   CO2 30 (A) 09/13/2020   Lab Results  Component Value Date   ALT 20 09/13/2020   AST 11 (A) 09/13/2020   ALKPHOS 128 (A) 09/13/2020   BILITOT 0.7 08/23/2020     RADIOGRAPHY: MR BRAIN W WO CONTRAST  Addendum Date: 07/02/2021   ADDENDUM REPORT: 07/02/2021 12:00 ADDENDUM: Findings discussed with Dr Kathyrn Sheriff via telephone at 11:36 a.m. Electronically Signed   By: Margaretha Sheffield M.D.   On: 07/02/2021 12:00   Result Date: 07/02/2021 CLINICAL DATA:  Hemangioblastoma of brain [facility]) D43.2 (ICD-10-CM) November 20, 2020. EXAM: MRI HEAD WITHOUT AND WITH CONTRAST TECHNIQUE: Multiplanar, multiecho pulse sequences of the brain and surrounding structures were obtained without and with intravenous contrast. CONTRAST:  72mL MULTIHANCE GADOBENATE DIMEGLUMINE 529 MG/ML IV SOLN COMPARISON:  MRI November 20, 2020. FINDINGS: Brain: Slightly increased size/bulk of nodular enhancement along the deep and superior margin resection cavity, measuring approximately 11 x 8 by 9 mm on series 17, image 59 and series 18, image 12 (previously 10 x 7 by 6 mm). No substantial change in wispy/linear enhancement more posteriorly, likely postsurgical. Ill-defined T2/FLAIR hyperintensity within the left cerebellum is mildly increased, probably related to treatment related change given associated volume loss and increased ex vacuo dilation of the fourth ventricle.  Suboccipital scalp fluid collection is slightly increased in size, measuring 5.1 x 3.9 cm. Redemonstrated remote lacunar infarcts in the supratentorial white matter and pons. Also, similar supratentorial patchy and confluent T2/FLAIR hyperintensity, nonspecific but likely related to chronic microvascular ischemic disease. Right frontal ventriculostomy catheter with mildly increased size of the lateral and third ventricle. No evidence of acute hemorrhage or midline shift. DWI is limited along the right frontal convexity due to artifact from the ventriculostomy catheter without visible acute infarct. Small dilated perivascular spaces in the basal ganglia. Vascular: Major arterial flow voids are maintained at the skull base. Skull  and upper cervical spine: Normal marrow signal. Sinuses/Orbits: Mild paranasal sinus mucosal thickening. No acute orbital findings. Other: No mastoid effusions. IMPRESSION: 1. Slightly increased size/bulk of nodular enhancement along the deep and superior margin of resection cavity, detailed above and compatible with slowly progressive residual tumor. 2. Right frontal ventriculostomy catheter with mildly increased size of the lateral and third ventricle. Shunt malfunction is not excluded. 3. Mildly increased ill-defined T2/FLAIR hyperintensity in the left cerebellum, probably related to post-treatment change given evidence of associated progressive volume loss. Recommend attention on follow-up. 4. 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 Electronically Signed: By: Margaretha Sheffield M.D. On: 07/02/2021 11:26      IMPRESSION/PLAN: 71. 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  Today, we talked to the patient and family about the findings and workup thus far. We discussed the natural history of hemangioblastoma and general treatment, highlighting the role of  radiotherapy in the management. We discussed the available radiation techniques, and focused on the details and logistics of delivery.  His case and imaging were reviewed at the recent multidisciplinary brain conference and consensus recommendation is to proceed with a single fraction of SRS to the resection bed. We reviewed the anticipated acute and late sequelae associated with radiation in this setting. The patient was encouraged to ask questions that were answered to his stated satisfaction.  At the end of our conversation, the patient agrees to proceed with the recommended single fraction of SRS to the resection cavity. He is scheduled for a 3T SRS planning MRI brain on 07/30/21 with CT simulation on 07/31/21 in anticipation of treatment on 08/12/21, coordinated with Dr. Kathyrn Sheriff.  He appears to have a good understanding of his disease and our treatment recommendations which are of curative intent.  He has freely signed written consent to proceed today in the office and a copy of this document will be placed in his medical record.  He knows that he is welcome to call at anytime in the interim with any questions or concerns related to radiation.  We enjoyed meeting him today and look forward to continuing to participate in his care.  I personally spent 60 minutes in this encounter including chart review, reviewing radiological studies, meeting face-to-face with the patient, entering orders and completing documentation.    Nicholos Johns, PA-C    Tyler Pita, MD  [facility] Direct Dial: [phone]  Fax: [phone] [email or web address]  Skype  [email or web address]   This document serves as a record of services personally performed by Tyler Pita, MD and Freeman Caldron, PA-C. It was created on their behalf by Wilburn Mylar, a trained medical scribe. The creation of this record is based on the scribe's personal observations and the provider's statements to them. This  document has been checked and approved by the attending provider.

## 2021-07-28 DIAGNOSIS — Z0000 Encounter for general adult medical examination without abnormal findings: Secondary | ICD-10-CM | POA: Diagnosis not present

## 2021-07-28 DIAGNOSIS — Z1331 Encounter for screening for depression: Secondary | ICD-10-CM | POA: Diagnosis not present

## 2021-07-28 DIAGNOSIS — E785 Hyperlipidemia, unspecified: Secondary | ICD-10-CM | POA: Diagnosis not present

## 2021-07-28 DIAGNOSIS — Z9181 History of falling: Secondary | ICD-10-CM | POA: Diagnosis not present

## 2021-07-30 ENCOUNTER — Ambulatory Visit
Admission: RE | Admit: 2021-07-30 | Discharge: 2021-07-30 | Disposition: A | Discharge status: Home / Self Care | Payer: Medicare Other | Source: Ambulatory Visit | Attending: Radiation Oncology | Admitting: Radiation Oncology

## 2021-07-30 ENCOUNTER — Other Ambulatory Visit: Payer: Self-pay

## 2021-07-30 DIAGNOSIS — Z9889 Other specified postprocedural states: Secondary | ICD-10-CM | POA: Diagnosis not present

## 2021-07-30 DIAGNOSIS — R220 Localized swelling, mass and lump, head: Secondary | ICD-10-CM | POA: Diagnosis not present

## 2021-07-30 DIAGNOSIS — D432 Neoplasm of uncertain behavior of brain, unspecified: Secondary | ICD-10-CM | POA: Diagnosis not present

## 2021-07-30 DIAGNOSIS — D332 Benign neoplasm of brain, unspecified: Secondary | ICD-10-CM | POA: Diagnosis not present

## 2021-07-30 DIAGNOSIS — I639 Cerebral infarction, unspecified: Secondary | ICD-10-CM | POA: Diagnosis not present

## 2021-07-30 IMAGING — MR MR HEAD WO/W CM
12 series · 48 of 48 positions shown · IV contrast (MULTIHANCE)
Comparison: Brain MRI [DATE], [DATE], [DATE],
[DATE]

CLINICAL DATA: Benign neoplasm, brain/CNS [REDACTED] Protocol for
treatment planning of hemangioma. Hemangioblastoma.

EXAM:
MRI HEAD WITHOUT AND WITH CONTRAST
TECHNIQUE: Multiplanar, multiecho pulse sequences of the brain and surrounding
structures were obtained without and with intravenous contrast.
CONTRAST:  15mL MULTIHANCE GADOBENATE DIMEGLUMINE 529 MG/ML IV SOLN

[Series 2: FLAIR · sagittal · 3.0mm · 0.75mm/px · 3 of 39 slices shown (1 of 2)]
[im 1/39]
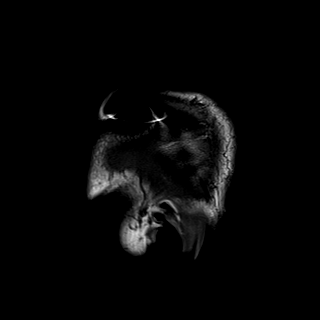
[im 20/39]
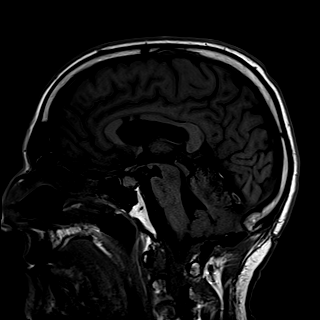
[im 39/39]
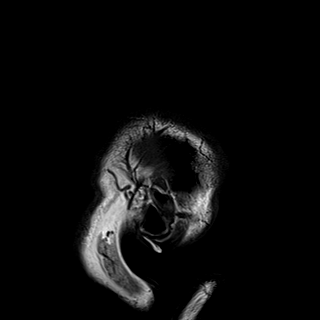

[Series 3: DWI · axial · 3.0mm · 1.50mm/px · z∈[-84,+80]mm · 4 of 86 slices shown (1 of 2)]
[im 1/86]
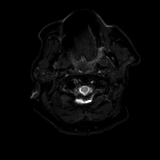
[im 29/86]
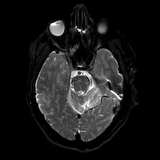
[im 57/86]
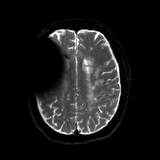
[im 86/86]
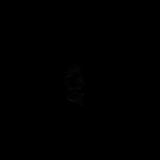

[Series 4: DWI · axial · 3.0mm · 1.50mm/px · z∈[-84,+80]mm · 2 of 38 slices shown (2 of 2)]
[im 1/38]
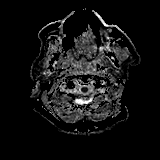
[im 38/38]
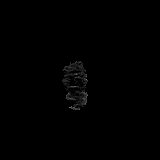

[Series 5: T2 · axial · 5.0mm · 0.57mm/px · 1 of 30 slices shown (1 of 2)]
[im 1/30]
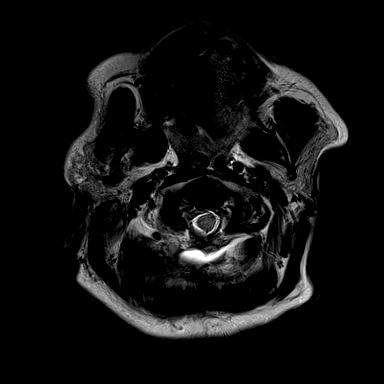

[Series 6: swi_images · axial · 1.5mm · 0.90mm/px · z∈[-83,+83]mm · 5 of 112 slices shown]
[im 1/112]
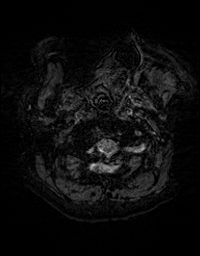
[im 28/112]
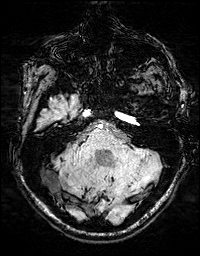
[im 56/112]
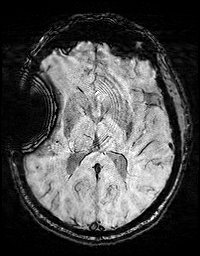
[im 84/112]
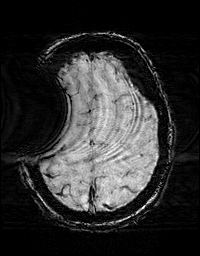
[im 112/112]
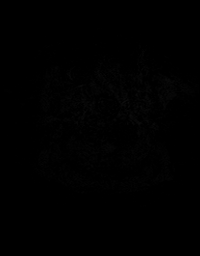

[Series 8: FLAIR · axial · 3.0mm · 0.86mm/px · z∈[-102,+81]mm · 3 of 62 slices shown (2 of 2)]
[im 1/62]
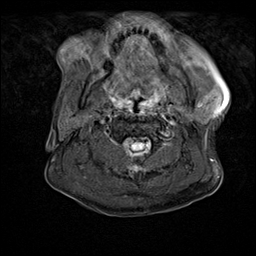
[im 31/62]
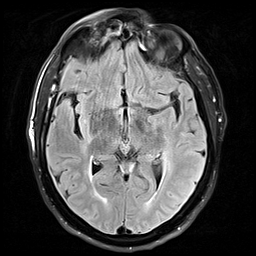
[im 62/62]
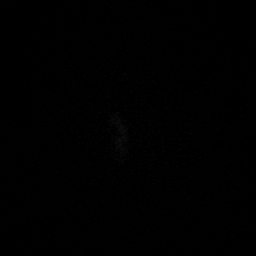

[Series 9: T2 · axial · non-contrast · 1.0mm · 0.86mm/px · z∈[-86,+86]mm · 8 of 176 slices shown (2 of 2)]
[im 1/176]
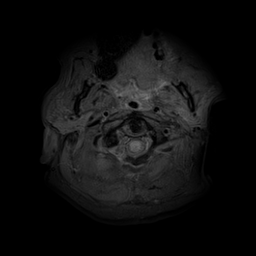
[im 26/176]
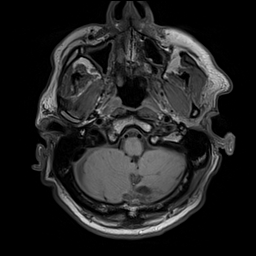
[im 51/176]
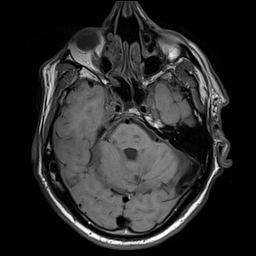
[im 76/176]
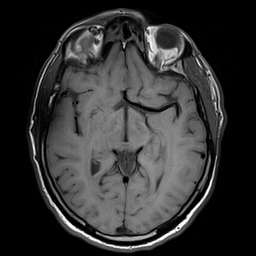
[im 101/176]
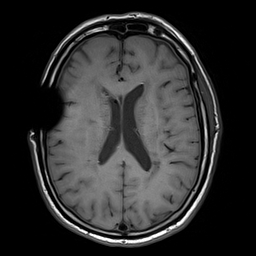
[im 126/176]
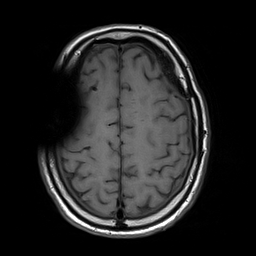
[im 151/176]
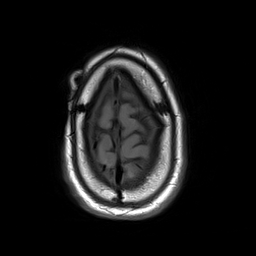
[im 176/176]
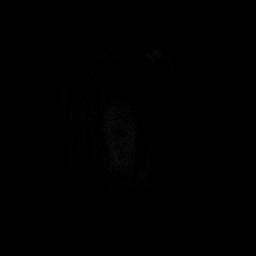

[Series 10: T2 post-contrast · coronal · 3.0mm · 0.57mm/px · 2 of 47 slices shown (1 of 2)]
[im 1/47]
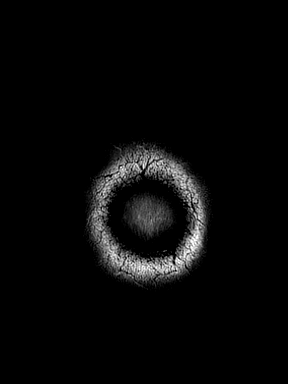
[im 47/47]
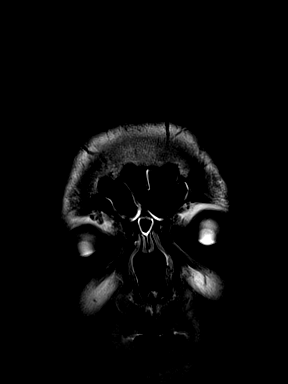

[Series 11: T2 post-contrast · axial · 1.0mm · 0.86mm/px · z∈[-86,+86]mm · 8 of 176 slices shown (2 of 2)]
[im 1/176]
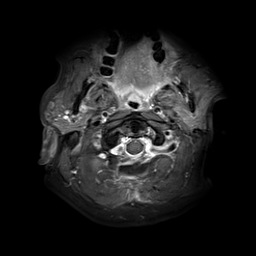
[im 26/176]
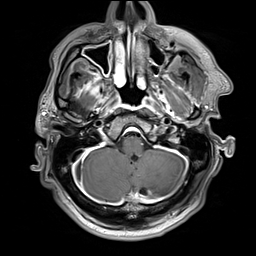
[im 51/176]
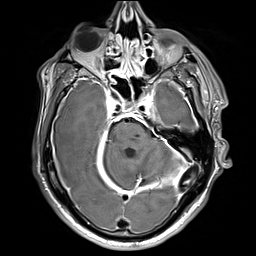
[im 76/176]
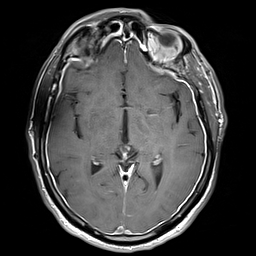
[im 101/176]
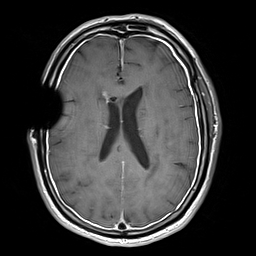
[im 126/176]
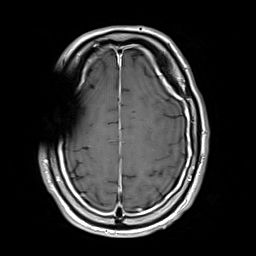
[im 151/176]
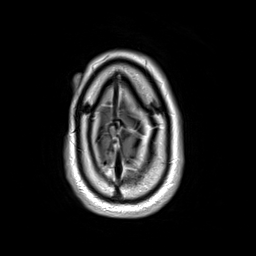
[im 176/176]
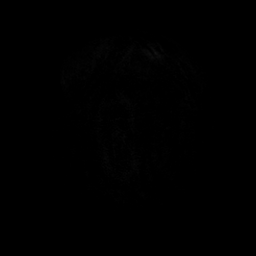

[Series 12: T1 post-contrast · axial · 1.0mm · 0.75mm/px · z∈[-79,+80]mm · 8 of 159 slices shown (1 of 2)]
[im 1/159]
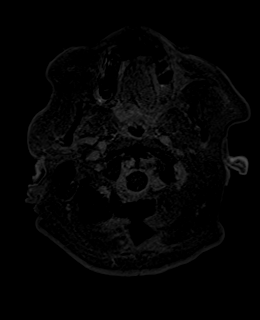
[im 23/159]
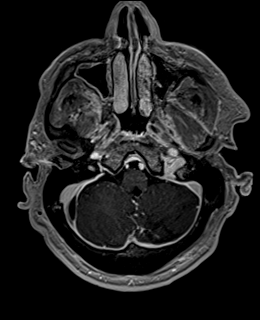
[im 46/159]
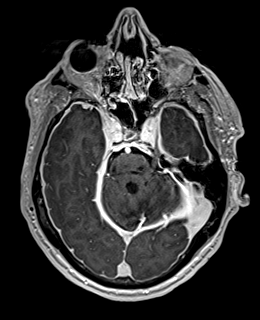
[im 68/159]
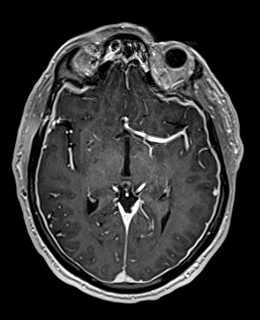
[im 91/159]
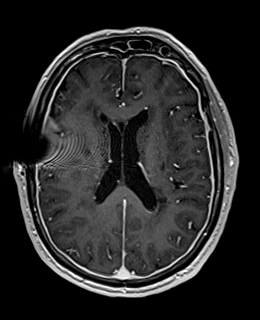
[im 113/159]
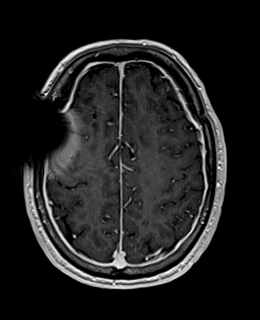
[im 136/159]
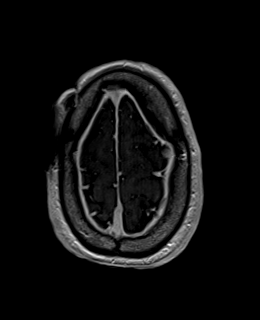
[im 159/159]
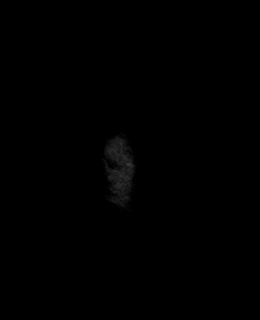

[Series 13: T1 post-contrast · coronal · 3.0mm · 0.57mm/px · 2 of 47 slices shown (2 of 2)]
[im 1/47]
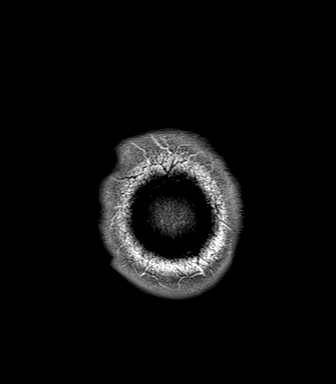
[im 47/47]
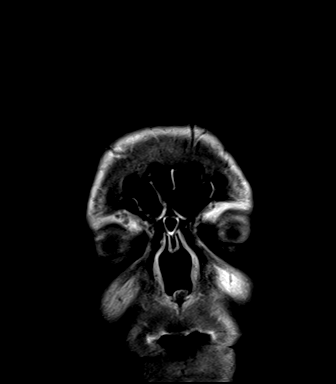

[Series 14: FLAIR post-contrast · sagittal · 3.0mm · 0.75mm/px · 2 of 39 slices shown]
[im 1/39]
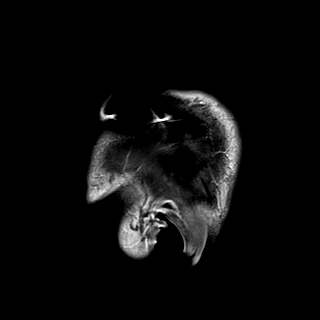
[im 39/39]
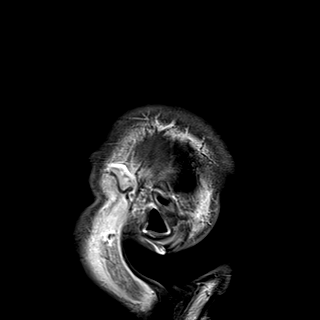

[48 of 48 positions shown; findings below may reference images not displayed]

FINDINGS: Brain: There is a punctate focus of abnormal diffusion restriction
within the left frontal white matter (series 3, image 74). There is
a right frontal approach shunt catheter terminating near the
foramina of [place]. Postoperative changes of left posterior fossa
mass resection with hyperintense T2-weighted signal surrounding the
resection cavity, unchanged. Prominent bilateral basal ganglia
perivascular spaces. Remote small vessel infarcts of the brainstem
and left cerebellum. There is diffuse pachymeningeal contrast
enhancement in thickening, which is new since [DATE]. Nodular
focus of contrast enhancement at the superior left cerebellar vermis
11 x 8 mm, unchanged. No new contrast-enhancing lesion. Foci of fat
within the frontal horns are unchanged. Multifocal hyperintense
T2-weighted signal of the periventricular and deep white matter,
most commonly due to chronic ischemic microangiopathy.

Vascular: Major flow voids are preserved.

Skull and upper cervical spine: Suboccipital craniectomy with
unchanged pseudomeningocele.

Sinuses/Orbits:No paranasal sinus fluid levels or advanced mucosal
thickening. No mastoid or middle ear effusion. Normal orbits.
IMPRESSION: 1. Unchanged 11 x 8 mm contrast-enhancing nodule at the superior
left cerebellar vermis.
2. Diffuse pachymeningeal contrast enhancement, new since
[DATE], which may indicate intracranial hypotension.
3. Punctate focus of abnormal diffusion restriction within the left
frontal white matter, consistent with a small acute to subacute
infarct.

## 2021-07-30 MED ORDER — GADOBENATE DIMEGLUMINE 529 MG/ML IV SOLN
15.0000 mL | Freq: Once | INTRAVENOUS | Status: AC | PRN
  Administered 2021-07-30: 14:00:00 15 mL via INTRAVENOUS

## 2021-07-31 ENCOUNTER — Ambulatory Visit
Admission: RE | Admit: 2021-07-31 | Discharge: 2021-07-31 | Disposition: A | Discharge status: Home / Self Care | Payer: Medicare Other | Source: Ambulatory Visit | Attending: Radiation Oncology | Admitting: Radiation Oncology

## 2021-07-31 ENCOUNTER — Ambulatory Visit: Payer: Medicare Other

## 2021-07-31 ENCOUNTER — Other Ambulatory Visit: Payer: Self-pay | Admitting: Radiation Therapy

## 2021-07-31 VITALS — BP 134/76 | HR 69 | Temp 97.5°F | Resp 20 | Ht 68.0 in | Wt 179.1 lb

## 2021-07-31 DIAGNOSIS — D432 Neoplasm of uncertain behavior of brain, unspecified: Secondary | ICD-10-CM

## 2021-07-31 DIAGNOSIS — Z510 Encounter for antineoplastic radiation therapy: Secondary | ICD-10-CM | POA: Diagnosis not present

## 2021-07-31 DIAGNOSIS — D330 Benign neoplasm of brain, supratentorial: Secondary | ICD-10-CM | POA: Diagnosis not present

## 2021-07-31 LAB — BUN & CREATININE (CHCC)
BUN: 18 mg/dL (ref 8–23)
Creatinine: 0.86 mg/dL (ref 0.61–1.24)
GFR, Estimated: >60 mL/min (ref >60)

## 2021-07-31 MED ORDER — SODIUM CHLORIDE 0.9% FLUSH
10.0000 mL | Freq: Once | INTRAVENOUS | Status: AC
  Administered 2021-07-31: 10 mL via INTRAVENOUS

## 2021-07-31 NOTE — Progress Notes (Signed)
Has armband been applied?  Yes.    Does patient have an allergy to IV contrast dye?: No.   Has patient ever received premedication for IV contrast dye?: No.   Does patient take metformin?: No.  If patient does take metformin when was the last dose: N/A  Date of lab work: July 31, 2021 BUN: 6 CR: 0.59  IV site: antecubital right, condition patent and no redness  Has IV site been added to flowsheet?  Yes.    BP 134/76 (BP Location: Right Arm, Patient Position: Sitting)    Pulse 69    Temp (!) 97.5 F (36.4 C) (Temporal)    Resp 20    Ht 5\' 8"  (1.727 m)    Wt 179 lb 2 oz (81.3 kg)    SpO2 97%    BMI 27.24 kg/m

## 2021-07-31 NOTE — Progress Notes (Signed)
°  Radiation Oncology         [phone] ________________________________  Name: Jose Ramirez MRN: [id number]  Date: 07/31/2021  DOB: [birth year]  SIMULATION AND TREATMENT PLANNING NOTE    ICD-10-CM   1. 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  NARRATIVE:  The patient was brought to the [facility].  Identity was confirmed.  All relevant records and images related to the planned course of therapy were reviewed.  The patient freely provided informed written consent to proceed with treatment after reviewing the details related to the planned course of therapy. The consent form was witnessed and verified by the simulation staff. Intravenous access was established for contrast administration. Then, the patient was set-up in a stable reproducible supine position for radiation therapy.  A relocatable thermoplastic stereotactic head frame was fabricated for precise immobilization.  CT images were obtained.  Surface markings were placed.  The CT images were loaded into the planning software and fused with the patient's targeting MRI scan.  Then the target and avoidance structures were contoured.  Treatment planning then occurred.  The radiation prescription was entered and confirmed.  I have requested 3D planning  I have requested a DVH of the following structures: Brain stem, brain, left eye, right eye, lenses, optic chiasm, target volumes, uninvolved brain, and normal tissue.    SPECIAL TREATMENT PROCEDURE:  The planned course of therapy using radiation constitutes a special treatment procedure. Special care is required in the management of this patient for the following reasons. This treatment constitutes a Special Treatment Procedure for the following reason: High dose per fraction requiring  special monitoring for increased toxicities of treatment including daily imaging.  The special nature of the planned course of radiotherapy will require increased physician supervision and oversight to ensure patient's safety with optimal treatment outcomes.  This requires extended time and effort.  PLAN:  The patient will receive 20 Gy in 1 fraction.  ________________________________  Sheral Apley Tammi Klippel, M.D.

## 2021-08-01 DIAGNOSIS — D432 Neoplasm of uncertain behavior of brain, unspecified: Secondary | ICD-10-CM | POA: Diagnosis not present

## 2021-08-01 DIAGNOSIS — Z510 Encounter for antineoplastic radiation therapy: Secondary | ICD-10-CM | POA: Diagnosis not present

## 2021-08-01 DIAGNOSIS — D330 Benign neoplasm of brain, supratentorial: Secondary | ICD-10-CM | POA: Diagnosis not present

## 2021-08-12 ENCOUNTER — Encounter: Payer: Self-pay | Admitting: Radiation Oncology

## 2021-08-12 ENCOUNTER — Other Ambulatory Visit: Payer: Self-pay

## 2021-08-12 ENCOUNTER — Ambulatory Visit
Admission: RE | Admit: 2021-08-12 | Discharge: 2021-08-12 | Disposition: A | Discharge status: Home / Self Care | Payer: Medicare Other | Source: Ambulatory Visit | Attending: Radiation Oncology | Admitting: Radiation Oncology

## 2021-08-12 VITALS — BP 152/82 | HR 58 | Temp 96.8°F | Resp 18

## 2021-08-12 DIAGNOSIS — Z510 Encounter for antineoplastic radiation therapy: Secondary | ICD-10-CM | POA: Diagnosis not present

## 2021-08-12 DIAGNOSIS — D432 Neoplasm of uncertain behavior of brain, unspecified: Secondary | ICD-10-CM

## 2021-08-12 DIAGNOSIS — D330 Benign neoplasm of brain, supratentorial: Secondary | ICD-10-CM | POA: Diagnosis not present

## 2021-08-12 DIAGNOSIS — D1802 Hemangioma of intracranial structures: Secondary | ICD-10-CM | POA: Diagnosis not present

## 2021-08-12 NOTE — Progress Notes (Signed)
Nurse monitoring complete status post 1 of 1 SRS treatments. Patient without complaints. Patient denies new or worsening neurologic symptoms. Vitals stable. Instructed patient to avoid strenuous activity for the next 24 hours. Instructed patient to call [phone] with needs related to treatment after hours or over the weekend. Patient verbalized understanding. Ambulated out of clinic unassisted without incidence.    Vitals:   08/12/21 1552  BP: (!) 152/82  Pulse: (!) 58  Resp: 18  Temp: (!) 96.8 F (36 C)  SpO2: 99%

## 2021-08-12 NOTE — Progress Notes (Addendum)
°  Radiation Oncology         [phone] ________________________________  Stereotactic Treatment Procedure Note  Name: Jose Ramirez MRN: [id number]  Date: 08/12/2021  DOB: Jun 05, 1951  SPECIAL TREATMENT PROCEDURE    ICD-10-CM   1. 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  3D TREATMENT PLANNING AND DOSIMETRY:  The patient's radiation plan was reviewed and approved by neurosurgery and radiation oncology prior to treatment.  It showed 3-dimensional radiation distributions overlaid onto the planning CT/MRI image set.  The [facility] for the target structures as well as the organs at risk were reviewed. The documentation of the 3D plan and dosimetry are filed in the radiation oncology EMR.  NARRATIVE:  Jose Ramirez was brought to the TrueBeam stereotactic radiation treatment machine and placed supine on the CT couch. The head frame was applied, and the patient was set up for stereotactic radiosurgery.  Neurosurgery was present for the set-up and delivery  SIMULATION VERIFICATION:  In the couch zero-angle position, the patient underwent Exactrac imaging using the Brainlab system with orthogonal KV images.  These were carefully aligned and repeated to confirm treatment position for each of the isocenters.  The Exactrac snap film verification was repeated at each couch angle.  PROCEDURE: Jose Ramirez received stereotactic radiosurgery to the following targets: Left cerebellar 11 mm target was treated using 6 Rapid Arc VMAT Beams to a prescription dose of 20 Gy.  ExacTrac registration was performed for each couch angle.  The 100% isodose line was prescribed.  6 MV X-rays were delivered in the flattening filter free beam mode.  STEREOTACTIC TREATMENT MANAGEMENT:  Following delivery, the patient was transported to nursing in stable condition and monitored for  possible acute effects.  Vital signs were recorded BP (!) 152/82 (BP Location: Left Arm, Patient Position: Sitting)    Pulse (!) 58    Temp (!) 96.8 F (36 C) (Temporal)    Resp 18    SpO2 99% . The patient tolerated treatment without significant acute effects, and was discharged to home in stable condition.    PLAN: Follow-up in one month.  ________________________________  Sheral Ramirez. Tammi Klippel, M.D.

## 2021-08-13 ENCOUNTER — Other Ambulatory Visit: Payer: Self-pay | Admitting: Radiation Therapy

## 2021-08-13 DIAGNOSIS — D432 Neoplasm of uncertain behavior of brain, unspecified: Secondary | ICD-10-CM

## 2021-08-19 NOTE — Op Note (Signed)
Name: Jose Ramirez    MRN: [id number]   Date: 08/12/2021    DOB: 1950/10/21   STEREOTACTIC RADIOSURGERY OPERATIVE NOTE  PRE-OPERATIVE DIAGNOSIS:  Cerebellar Hemangioblastoma  POST-OPERATIVE DIAGNOSIS:  Same  PROCEDURE:  Stereotactic Radiosurgery  SURGEON:  Consuella Lose, MD  RADIATION ONCOLOGIST: Dr. Tyler Pita, MD  TECHNIQUE:  The patient underwent a radiation treatment planning session in the radiation oncology simulation suite under the care of the radiation oncology physician and physicist.  I participated closely in the radiation treatment planning afterwards. The patient underwent planning CT which was fused to 3T high resolution MRI with 1 mm axial slices.  These images were fused on the planning system.  We contoured the gross target volumes and subsequently expanded this to yield the Planning Target Volume. I actively participated in the planning process.  I helped to define and review the target contours and also the contours of the optic pathway, eyes, brainstem and selected nearby organs at risk.  All the dose constraints for critical structures were reviewed and compared to AAPM Task Group 101.  The prescription dose conformity was reviewed.  I approved the plan electronically.    Accordingly, Jose Ramirez  was brought to the TrueBeam stereotactic radiation treatment linac and placed in the custom immobilization mask.  The patient was aligned according to the IR fiducial markers with BrainLab Exactrac, then orthogonal x-rays were used in ExacTrac with the 6DOF robotic table and the shifts were made to align the patient  Jose Ramirez received stereotactic radiosurgery to a prescription dose of 20Gy to the residual cerebellar lesion uneventfully.    The detailed description of the procedure is recorded in the radiation oncology procedure note.  I was present for the duration of the procedure.  DISPOSITION:   Following delivery, the patient was transported to nursing in stable  condition and monitored for possible acute effects to be discharged to home in stable condition with follow-up in one month.  Consuella Lose, MD [facility]

## 2021-08-19 NOTE — Addendum Note (Signed)
Encounter addended by: Consuella Lose, MD on: 08/19/2021 11:36 AM  Actions taken: Clinical Note Signed

## 2021-09-16 ENCOUNTER — Encounter: Payer: Self-pay | Admitting: Urology

## 2021-09-16 NOTE — Progress Notes (Signed)
Spoke w/ patient, verified identity, and begin nursing interview. Patient states "He's in no pain and doing well." No symptoms reported at this time.  Meaningful use complete.  Patient notified of his 2:00pm  telephone appointment w/ Ashlyn Bruning PA-C. I left my extension [phone] in case patient needs to call. Patient verbalized understanding of information.  Patient contact [phone]

## 2021-09-18 ENCOUNTER — Ambulatory Visit
Admission: RE | Admit: 2021-09-18 | Discharge: 2021-09-18 | Disposition: A | Discharge status: Home / Self Care | Payer: Medicare Other | Source: Ambulatory Visit | Attending: Radiation Oncology | Admitting: Radiation Oncology

## 2021-09-18 DIAGNOSIS — D432 Neoplasm of uncertain behavior of brain, unspecified: Secondary | ICD-10-CM | POA: Insufficient documentation

## 2021-09-18 NOTE — Progress Notes (Signed)
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  Interval Since Last Radiation:  5 weeks  08/12/21 SRS Brain/ 11 mm left cerebellar recurrence of hemangioblastoma    Narrative:  I spoke with the patient to conduct his routine scheduled 1 month follow up visit via telephone to spare the patient unnecessary potential exposure in the healthcare setting during the current COVID-19 pandemic.  The patient was notified in advance and gave permission to proceed with this visit format.  He tolerated radiation well without any side effects.                              On review of systems, the patient states that he is doing well in general and continues without complaints.  He did not experience any ill side effects with the treatment and has not developed any side effects since the time of treatment.  He specifically denies headaches, visual changes, focal weakness or confusion.  He reports a healthy appetite and is maintaining his weight.  He denies any abdominal pain, nausea, vomiting, diarrhea or constipation.  Overall, he is quite pleased with his progress to date.  ALLERGIES:  has No Known Allergies.  Meds: Current Outpatient Medications  Medication Sig Dispense Refill   docusate sodium (COLACE) 100 MG capsule Take 100 mg by mouth.     ibuprofen (ADVIL) 800 MG tablet Take 800 mg by mouth 3 (three) times daily.     meclizine (ANTIVERT) 25 MG tablet Take 1 tablet (25 mg total) by mouth 2 (two) times daily as needed for dizziness or nausea. 60 tablet 0   melatonin 5 MG TABS Take 5 mg by mouth at bedtime.     metoprolol tartrate (LOPRESSOR) 50 MG tablet Take 1 tablet (50 mg total) by  mouth 2 (two) times daily. 60 tablet 0   metoprolol tartrate (LOPRESSOR) 50 MG tablet Take by mouth.     Multiple Vitamin (MULTIVITAMIN WITH MINERALS) TABS tablet Take 1 tablet by mouth daily.     nystatin cream (MYCOSTATIN) Apply topically.     Omega-3 Fatty Acids (FISH OIL) 1000 MG CAPS Take 2,000 mg by mouth in the morning and at bedtime.     omeprazole (PRILOSEC) 20 MG capsule Take by mouth.     ondansetron (ZOFRAN) 4 MG tablet Take 1 tablet (4 mg total) by mouth daily as needed for nausea or vomiting. 30 tablet 0   prochlorperazine (COMPAZINE) 10 MG tablet Take by mouth.     simvastatin (ZOCOR) 40 MG tablet Take 1 tablet (40 mg total) by mouth at bedtime. 30 tablet 0   tamsulosin (FLOMAX) 0.4 MG CAPS capsule SMARTSIG:1 Capsule(s) By Mouth Every Evening     tiZANidine (ZANAFLEX) 4 MG tablet TAKE 1 (ONE) TABLET BY MOUTH 3 TIMES A DAY AS NEEDED FOR MUSCLE PAIN/SPASM     traZODone (DESYREL) 150 MG tablet Take 150 mg by mouth at bedtime.     No current facility-administered medications for this encounter.    Physical Findings:  vitals were not taken for this visit.  Pain Assessment Pain Score: 0-No pain/10 Unable to assess due to telephone follow-up  visit format.  Lab Findings: Lab Results  Component Value Date   WBC 11.1 09/13/2020   HGB 14.1 09/13/2020   HCT 41 09/13/2020   MCV 90.9 08/30/2020   PLT 316 09/13/2020     Radiographic Findings: No results found.  Impression/Plan: 31. 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 He appears to have recovered well from the effects of his recent [facility] treatment is without complaints.  We discussed that while we are happy to continue to participate in his care if clinically indicated in the future, at this point, we will plan to see him back on an as-needed basis.  He will continue in routine follow-up under the care and direction of Dr. Kathyrn Sheriff and  is scheduled for a repeat MRI brain on 11/12/2021 to assess treatment response and will see Dr. Kathyrn Sheriff that same day.  We enjoyed taking care of him forward to continuing to follow his progress via correspondence.    Nicholos Johns, PA-C

## 2021-09-21 NOTE — Progress Notes (Signed)
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     Indication for treatment:  Curative       Radiation treatment dates:   08/12/21  Site/dose/beams/energy:   Left cerebellar 11 mm target was treated using 6 Rapid Arc VMAT Beams to a prescription dose of 20 Gy.  ExacTrac registration was performed for each couch angle.  The 100% isodose line was prescribed.  6 MV X-rays were delivered in the flattening filter free beam mode.  Narrative: The patient tolerated radiation treatment relatively well.     Plan: The patient has completed radiation treatment. The patient will return to radiation oncology clinic for routine followup in one month. I advised him to call or return sooner if he has any questions or concerns related to his recovery or treatment. ________________________________  Sheral Apley. Tammi Klippel, M.D.

## 2021-09-29 DIAGNOSIS — G919 Hydrocephalus, unspecified: Secondary | ICD-10-CM | POA: Diagnosis not present

## 2021-09-29 DIAGNOSIS — F418 Other specified anxiety disorders: Secondary | ICD-10-CM | POA: Diagnosis not present

## 2021-09-29 DIAGNOSIS — M25512 Pain in left shoulder: Secondary | ICD-10-CM | POA: Diagnosis not present

## 2021-09-29 DIAGNOSIS — Z6827 Body mass index (BMI) 27.0-27.9, adult: Secondary | ICD-10-CM | POA: Diagnosis not present

## 2021-09-29 DIAGNOSIS — R110 Nausea: Secondary | ICD-10-CM | POA: Diagnosis not present

## 2021-09-29 DIAGNOSIS — D481 Neoplasm of uncertain behavior of connective and other soft tissue: Secondary | ICD-10-CM | POA: Diagnosis not present

## 2021-09-29 DIAGNOSIS — Z8673 Personal history of transient ischemic attack (TIA), and cerebral infarction without residual deficits: Secondary | ICD-10-CM | POA: Diagnosis not present

## 2021-09-29 DIAGNOSIS — E1169 Type 2 diabetes mellitus with other specified complication: Secondary | ICD-10-CM | POA: Diagnosis not present

## 2021-09-29 DIAGNOSIS — E782 Mixed hyperlipidemia: Secondary | ICD-10-CM | POA: Diagnosis not present

## 2021-09-29 DIAGNOSIS — I10 Essential (primary) hypertension: Secondary | ICD-10-CM | POA: Diagnosis not present

## 2021-09-29 DIAGNOSIS — G4700 Insomnia, unspecified: Secondary | ICD-10-CM | POA: Diagnosis not present

## 2021-11-12 ENCOUNTER — Ambulatory Visit
Admission: RE | Admit: 2021-11-12 | Discharge: 2021-11-12 | Disposition: A | Discharge status: Home / Self Care | Payer: Medicare Other | Source: Ambulatory Visit | Attending: Radiation Oncology | Admitting: Radiation Oncology

## 2021-11-12 ENCOUNTER — Other Ambulatory Visit: Payer: Self-pay

## 2021-11-12 DIAGNOSIS — I6381 Other cerebral infarction due to occlusion or stenosis of small artery: Secondary | ICD-10-CM | POA: Diagnosis not present

## 2021-11-12 DIAGNOSIS — D432 Neoplasm of uncertain behavior of brain, unspecified: Secondary | ICD-10-CM | POA: Diagnosis not present

## 2021-11-12 DIAGNOSIS — R220 Localized swelling, mass and lump, head: Secondary | ICD-10-CM | POA: Diagnosis not present

## 2021-11-12 DIAGNOSIS — J3489 Other specified disorders of nose and nasal sinuses: Secondary | ICD-10-CM | POA: Diagnosis not present

## 2021-11-12 DIAGNOSIS — D481 Neoplasm of uncertain behavior of connective and other soft tissue: Secondary | ICD-10-CM | POA: Diagnosis not present

## 2021-11-12 IMAGING — MR MR HEAD WO/W CM
12 series · 48 of 48 positions shown · IV contrast (15 ml multihance)
Comparison: Brain MRI [DATE]

CLINICAL DATA: Hemangioblastoma status post surgery and radiation

EXAM:
MRI HEAD WITHOUT AND WITH CONTRAST
TECHNIQUE: Multiplanar, multiecho pulse sequences of the brain and surrounding
structures were obtained without and with intravenous contrast.
CONTRAST:  15mL MULTIHANCE GADOBENATE DIMEGLUMINE 529 MG/ML IV SOLN

[Series 2: FLAIR · sagittal · 3.0mm · 0.75mm/px · 3 of 45 slices shown (1 of 2)]
[im 1/45]
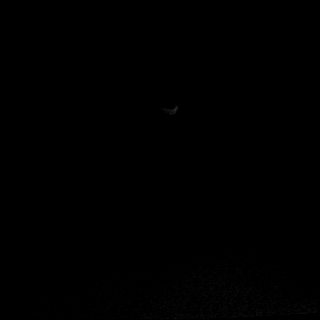
[im 23/45]
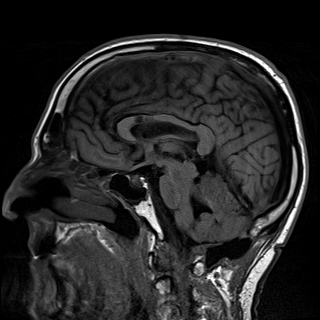
[im 45/45]
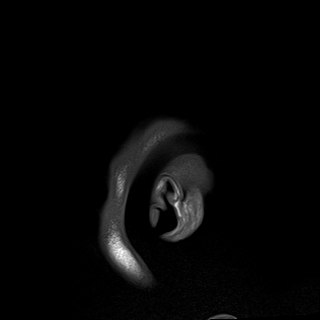

[Series 3: DWI · axial · 3.0mm · 1.50mm/px · z∈[-83,+78]mm · 4 of 86 slices shown (1 of 2)]
[im 1/86]
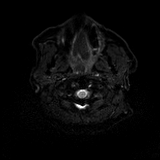
[im 29/86]
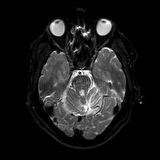
[im 57/86]
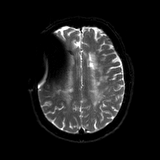
[im 86/86]
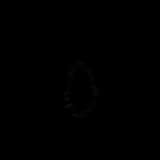

[Series 4: DWI · axial · 3.0mm · 1.50mm/px · z∈[-83,+78]mm · 2 of 42 slices shown (2 of 2)]
[im 1/42]
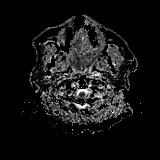
[im 42/42]
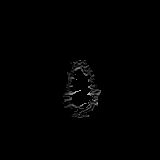

[Series 5: T2 · axial · 5.0mm · 0.57mm/px · 1 of 28 slices shown (1 of 2)]
[im 1/28]
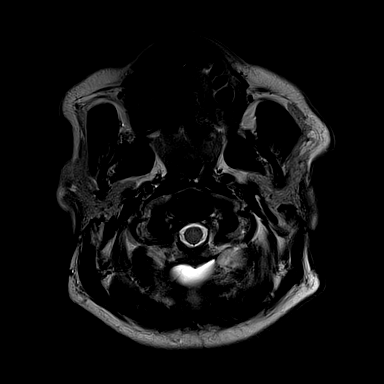

[Series 6: swi_images · axial · 1.5mm · 0.90mm/px · z∈[-83,+70]mm · 5 of 104 slices shown]
[im 1/104]
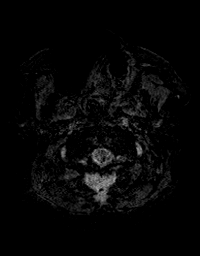
[im 26/104]
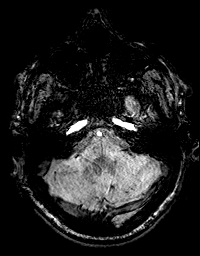
[im 52/104]
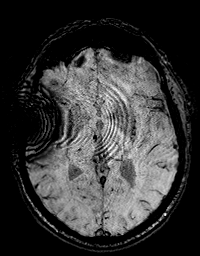
[im 78/104]
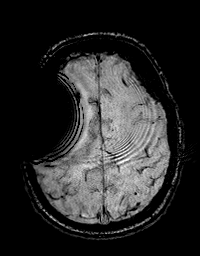
[im 104/104]
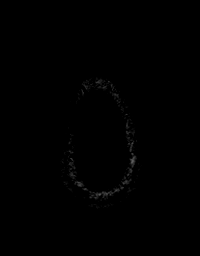

[Series 8: FLAIR · axial · 3.0mm · 0.86mm/px · z∈[-125,+64]mm · 3 of 64 slices shown (2 of 2)]
[im 1/64]
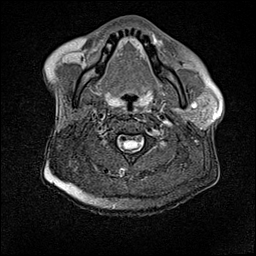
[im 32/64]
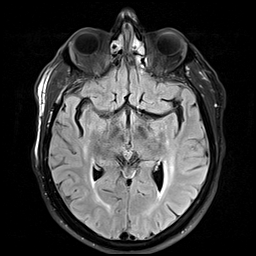
[im 64/64]
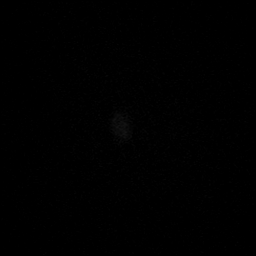

[Series 9: T2 · axial · non-contrast · 1.0mm · 0.86mm/px · z∈[-106,+65]mm · 8 of 176 slices shown (2 of 2)]
[im 1/176]
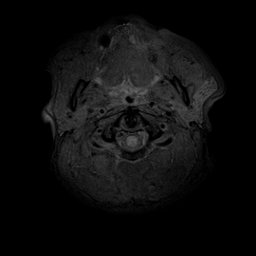
[im 26/176]
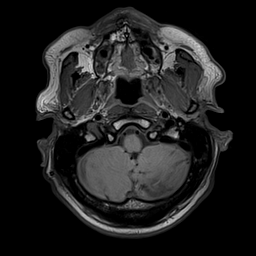
[im 51/176]
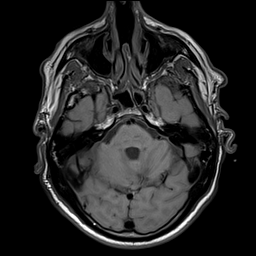
[im 76/176]
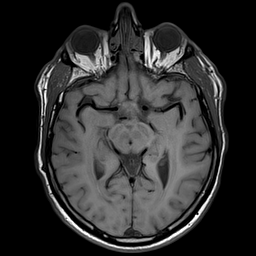
[im 101/176]
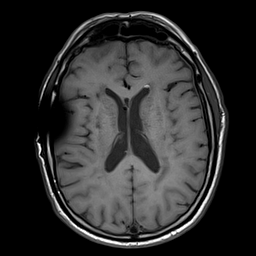
[im 126/176]
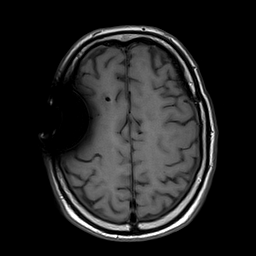
[im 151/176]
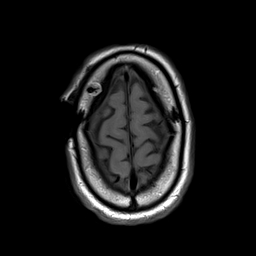
[im 176/176]
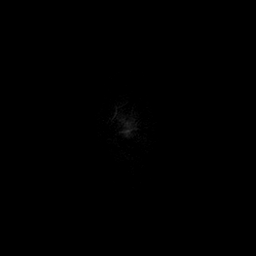

[Series 10: T2 post-contrast · coronal · 3.0mm · 0.57mm/px · 2 of 48 slices shown (1 of 2)]
[im 1/48]
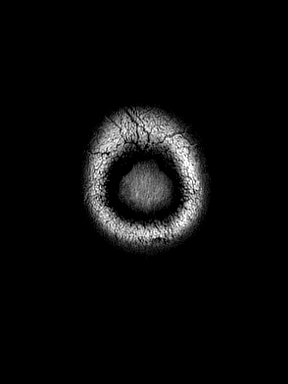
[im 48/48]
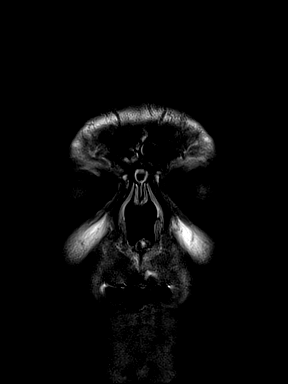

[Series 11: T2 post-contrast · axial · 1.0mm · 0.86mm/px · z∈[-106,+65]mm · 8 of 176 slices shown (2 of 2)]
[im 1/176]
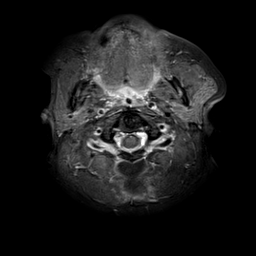
[im 26/176]
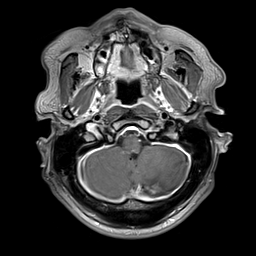
[im 51/176]
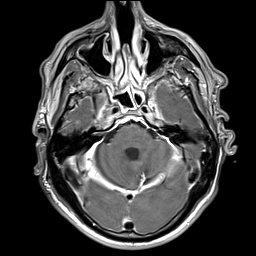
[im 76/176]
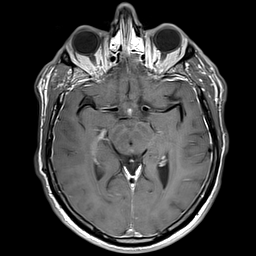
[im 101/176]
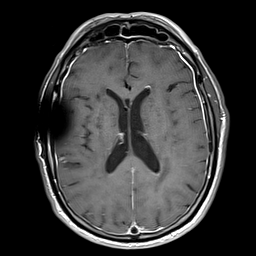
[im 126/176]
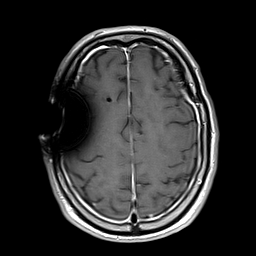
[im 151/176]
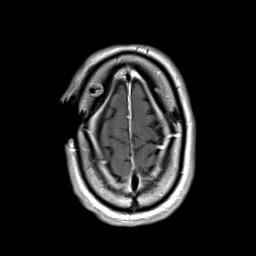
[im 176/176]
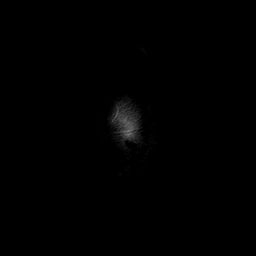

[Series 12: T1 post-contrast · axial · 1.0mm · 0.75mm/px · z∈[-99,+59]mm · 8 of 160 slices shown (1 of 2)]
[im 1/160]
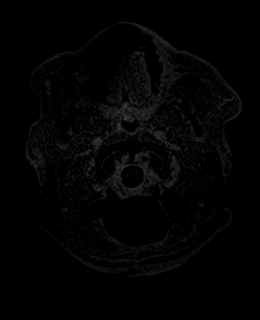
[im 23/160]
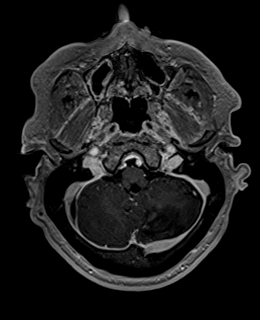
[im 46/160]
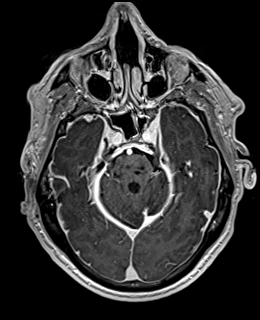
[im 69/160]
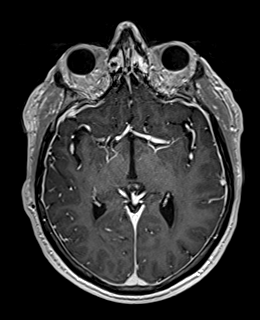
[im 91/160]
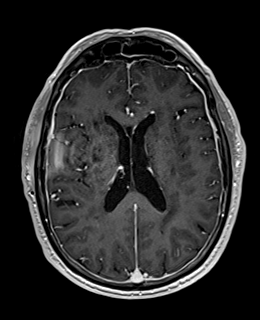
[im 114/160]
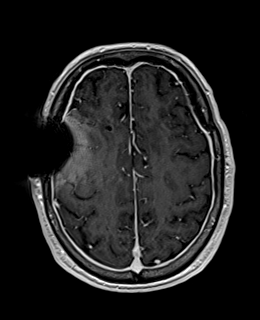
[im 137/160]
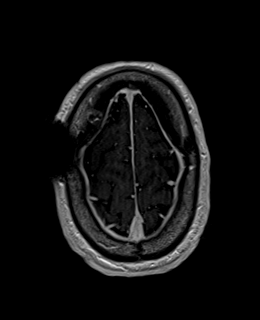
[im 160/160]
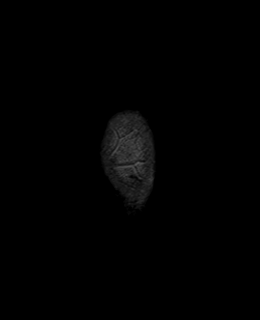

[Series 13: T1 post-contrast · coronal · 3.0mm · 0.57mm/px · 2 of 48 slices shown (2 of 2)]
[im 1/48]
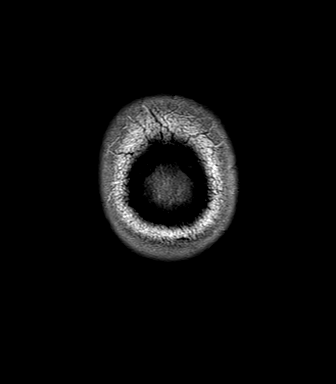
[im 48/48]
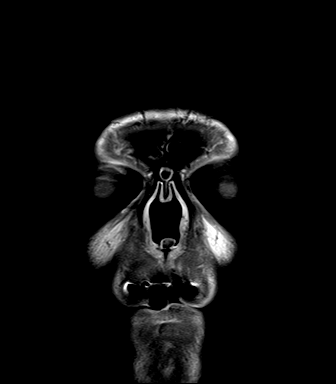

[Series 14: FLAIR post-contrast · sagittal · 3.0mm · 0.75mm/px · 2 of 46 slices shown]
[im 1/46]
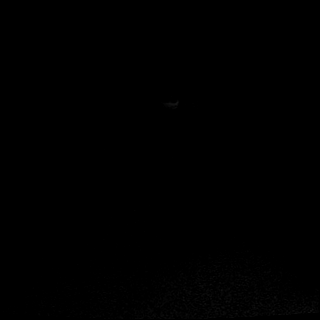
[im 46/46]
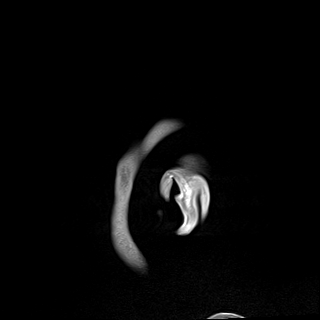

[48 of 48 positions shown; findings below may reference images not displayed]

FINDINGS: Brain: Postsurgical changes reflecting suboccipital craniectomy for
mass resection are again seen. The residual enhancing nodule along
the superior aspect of the left cerebellar vermis measuring 1.3 cm
by 1.0 cm in the axial plane is unchanged when measured again using
similar technique compared to the study from [DATE], but is
slightly increased in size compared to the study from [DATE],
when it measured 1.0 cm x 0.8 cm. FLAIR hyperintensity in the
surrounding cerebellar parenchyma is unchanged. There is no
effacement of fourth ventricle or upstream hydrocephalus.

Diffuse pachymeningeal thickening and enhancement with small
subdural collections overlying the bilateral cerebral and cerebellar
hemispheres is unchanged. The enhancement extends into the imaged
upper cervical spine.

Intrinsically T1 hyperintense foci in the anterior horns of the
lateral ventricles are unchanged. There is no new solid lesion or
abnormal enhancement.

A right frontal ventricular catheter remains in place. The
ventricles are stable in size and configuration.

There is ill-defined diffusion restriction in the left centrum
semiovale (3-71), which could reflect an evolving acute to subacute
infarct. There is no evidence of large vessel territorial infarct.
Small remote lacunar infarcts in the bilateral corona radiata, basal
ganglia, and pons are again seen. There is no evidence of acute
intracranial hemorrhage. Confluent FLAIR signal abnormality
throughout the subcortical and periventricular white matter likely
reflects sequela of chronic white matter microangiopathy, unchanged.

There is no midline shift.

Vascular: Normal flow voids.

Skull and upper cervical spine: Normal marrow signal.

Sinuses/Orbits: There is mild mucosal thickening in the paranasal
sinuses. The globes and orbits are unremarkable.

Other: The 4.9 cm x 3.5 cm fluid collection in the suboccipital soft
tissues is unchanged.
IMPRESSION: 1. Postsurgical changes reflecting posterior fossa mass resection
the residual enhancing nodule at the left aspect of the superior
cerebellar vermis is not significantly changed compared to the study
from [DATE], but is slightly increased in size compared to the
study from [DATE].
2. Diffuse pachymeningeal thickening and enhancement with bilateral
subdural collections overlying the cerebral and cerebellar
hemispheres, unchanged and again possibly reflecting intracranial
hypotension. The collections and enhancement appear to extend in the
imaged upper cervical spine. Consider cervical spine MRI with and
without contrast for evaluation of the inferior extent and any
possible cord compression.
3. Ill-defined diffusion restriction in the left centrum semiovale
could reflect a small evolving subacute infarct.
4. Right frontal ventricular catheter in place with stable size and
configuration of the ventricular system.

## 2021-11-12 MED ORDER — GADOBENATE DIMEGLUMINE 529 MG/ML IV SOLN
15.0000 mL | Freq: Once | INTRAVENOUS | Status: AC | PRN
  Administered 2021-11-12: 15 mL via INTRAVENOUS

## 2021-11-14 ENCOUNTER — Other Ambulatory Visit: Payer: Self-pay | Admitting: Neurosurgery

## 2021-11-14 DIAGNOSIS — D432 Neoplasm of uncertain behavior of brain, unspecified: Secondary | ICD-10-CM

## 2021-11-17 ENCOUNTER — Other Ambulatory Visit: Payer: Self-pay | Admitting: Radiation Therapy

## 2022-01-27 DIAGNOSIS — I10 Essential (primary) hypertension: Secondary | ICD-10-CM | POA: Diagnosis not present

## 2022-01-27 DIAGNOSIS — D481 Neoplasm of uncertain behavior of connective and other soft tissue: Secondary | ICD-10-CM | POA: Diagnosis not present

## 2022-01-27 DIAGNOSIS — M542 Cervicalgia: Secondary | ICD-10-CM | POA: Diagnosis not present

## 2022-01-27 DIAGNOSIS — Z125 Encounter for screening for malignant neoplasm of prostate: Secondary | ICD-10-CM | POA: Diagnosis not present

## 2022-01-27 DIAGNOSIS — G4700 Insomnia, unspecified: Secondary | ICD-10-CM | POA: Diagnosis not present

## 2022-01-27 DIAGNOSIS — Z8673 Personal history of transient ischemic attack (TIA), and cerebral infarction without residual deficits: Secondary | ICD-10-CM | POA: Diagnosis not present

## 2022-01-27 DIAGNOSIS — Z79899 Other long term (current) drug therapy: Secondary | ICD-10-CM | POA: Diagnosis not present

## 2022-01-27 DIAGNOSIS — E1169 Type 2 diabetes mellitus with other specified complication: Secondary | ICD-10-CM | POA: Diagnosis not present

## 2022-01-27 DIAGNOSIS — G919 Hydrocephalus, unspecified: Secondary | ICD-10-CM | POA: Diagnosis not present

## 2022-01-27 DIAGNOSIS — R110 Nausea: Secondary | ICD-10-CM | POA: Diagnosis not present

## 2022-01-27 DIAGNOSIS — E782 Mixed hyperlipidemia: Secondary | ICD-10-CM | POA: Diagnosis not present

## 2022-02-11 ENCOUNTER — Ambulatory Visit
Admission: RE | Admit: 2022-02-11 | Discharge: 2022-02-11 | Disposition: A | Discharge status: Home / Self Care | Payer: Medicare Other | Source: Ambulatory Visit | Attending: Neurosurgery | Admitting: Neurosurgery

## 2022-02-11 DIAGNOSIS — D432 Neoplasm of uncertain behavior of brain, unspecified: Secondary | ICD-10-CM

## 2022-02-11 DIAGNOSIS — I619 Nontraumatic intracerebral hemorrhage, unspecified: Secondary | ICD-10-CM | POA: Diagnosis not present

## 2022-02-11 DIAGNOSIS — Z6829 Body mass index (BMI) 29.0-29.9, adult: Secondary | ICD-10-CM | POA: Diagnosis not present

## 2022-02-11 DIAGNOSIS — D481 Neoplasm of uncertain behavior of connective and other soft tissue: Secondary | ICD-10-CM | POA: Diagnosis not present

## 2022-02-11 DIAGNOSIS — G919 Hydrocephalus, unspecified: Secondary | ICD-10-CM | POA: Diagnosis not present

## 2022-02-11 DIAGNOSIS — J3489 Other specified disorders of nose and nasal sinuses: Secondary | ICD-10-CM | POA: Diagnosis not present

## 2022-02-11 DIAGNOSIS — G9389 Other specified disorders of brain: Secondary | ICD-10-CM | POA: Diagnosis not present

## 2022-02-11 MED ORDER — GADOBENATE DIMEGLUMINE 529 MG/ML IV SOLN
17.0000 mL | Freq: Once | INTRAVENOUS | Status: AC | PRN
  Administered 2022-02-11: 17 mL via INTRAVENOUS

## 2022-02-12 ENCOUNTER — Other Ambulatory Visit: Payer: Medicare Other

## 2022-02-23 ENCOUNTER — Inpatient Hospital Stay: Payer: Medicare Other | Attending: Neurosurgery

## 2022-04-09 ENCOUNTER — Other Ambulatory Visit (HOSPITAL_COMMUNITY): Payer: Self-pay | Admitting: Neurosurgery

## 2022-04-09 ENCOUNTER — Other Ambulatory Visit: Payer: Self-pay | Admitting: Radiation Therapy

## 2022-04-09 DIAGNOSIS — D432 Neoplasm of uncertain behavior of brain, unspecified: Secondary | ICD-10-CM

## 2022-04-10 ENCOUNTER — Other Ambulatory Visit: Payer: Self-pay | Admitting: Neurosurgery

## 2022-04-10 DIAGNOSIS — D432 Neoplasm of uncertain behavior of brain, unspecified: Secondary | ICD-10-CM

## 2022-04-23 ENCOUNTER — Other Ambulatory Visit: Payer: Medicare Other

## 2022-04-29 ENCOUNTER — Other Ambulatory Visit: Payer: Medicare Other

## 2022-04-29 ENCOUNTER — Ambulatory Visit
Admission: RE | Admit: 2022-04-29 | Discharge: 2022-04-29 | Disposition: A | Discharge status: Home / Self Care | Payer: Medicare Other | Source: Ambulatory Visit | Attending: Neurosurgery | Admitting: Neurosurgery

## 2022-04-29 DIAGNOSIS — I639 Cerebral infarction, unspecified: Secondary | ICD-10-CM | POA: Diagnosis not present

## 2022-04-29 DIAGNOSIS — D432 Neoplasm of uncertain behavior of brain, unspecified: Secondary | ICD-10-CM | POA: Diagnosis not present

## 2022-04-29 DIAGNOSIS — D181 Lymphangioma, any site: Secondary | ICD-10-CM | POA: Diagnosis not present

## 2022-04-29 DIAGNOSIS — G9389 Other specified disorders of brain: Secondary | ICD-10-CM | POA: Diagnosis not present

## 2022-04-29 DIAGNOSIS — I739 Peripheral vascular disease, unspecified: Secondary | ICD-10-CM | POA: Diagnosis not present

## 2022-04-29 MED ORDER — GADOBENATE DIMEGLUMINE 529 MG/ML IV SOLN
18.0000 mL | Freq: Once | INTRAVENOUS | Status: AC | PRN
  Administered 2022-04-29: 18 mL via INTRAVENOUS

## 2022-05-20 ENCOUNTER — Other Ambulatory Visit: Payer: Self-pay | Admitting: Neurosurgery

## 2022-05-20 DIAGNOSIS — D432 Neoplasm of uncertain behavior of brain, unspecified: Secondary | ICD-10-CM

## 2022-06-11 DIAGNOSIS — G4700 Insomnia, unspecified: Secondary | ICD-10-CM | POA: Diagnosis not present

## 2022-06-11 DIAGNOSIS — E1169 Type 2 diabetes mellitus with other specified complication: Secondary | ICD-10-CM | POA: Diagnosis not present

## 2022-06-11 DIAGNOSIS — Z0000 Encounter for general adult medical examination without abnormal findings: Secondary | ICD-10-CM | POA: Diagnosis not present

## 2022-06-11 DIAGNOSIS — Z8673 Personal history of transient ischemic attack (TIA), and cerebral infarction without residual deficits: Secondary | ICD-10-CM | POA: Diagnosis not present

## 2022-06-11 DIAGNOSIS — H6121 Impacted cerumen, right ear: Secondary | ICD-10-CM | POA: Diagnosis not present

## 2022-06-11 DIAGNOSIS — E782 Mixed hyperlipidemia: Secondary | ICD-10-CM | POA: Diagnosis not present

## 2022-06-11 DIAGNOSIS — K219 Gastro-esophageal reflux disease without esophagitis: Secondary | ICD-10-CM | POA: Diagnosis not present

## 2022-06-11 DIAGNOSIS — G919 Hydrocephalus, unspecified: Secondary | ICD-10-CM | POA: Diagnosis not present

## 2022-06-11 DIAGNOSIS — M542 Cervicalgia: Secondary | ICD-10-CM | POA: Diagnosis not present

## 2022-06-11 DIAGNOSIS — I10 Essential (primary) hypertension: Secondary | ICD-10-CM | POA: Diagnosis not present

## 2022-06-11 DIAGNOSIS — Z139 Encounter for screening, unspecified: Secondary | ICD-10-CM | POA: Diagnosis not present

## 2022-06-11 DIAGNOSIS — Z79899 Other long term (current) drug therapy: Secondary | ICD-10-CM | POA: Diagnosis not present

## 2022-06-11 DIAGNOSIS — Z23 Encounter for immunization: Secondary | ICD-10-CM | POA: Diagnosis not present

## 2022-06-17 ENCOUNTER — Other Ambulatory Visit: Payer: Self-pay | Admitting: Radiation Therapy

## 2022-07-16 DIAGNOSIS — R6889 Other general symptoms and signs: Secondary | ICD-10-CM | POA: Diagnosis not present

## 2022-07-16 DIAGNOSIS — R0600 Dyspnea, unspecified: Secondary | ICD-10-CM | POA: Diagnosis not present

## 2022-07-16 DIAGNOSIS — R509 Fever, unspecified: Secondary | ICD-10-CM | POA: Diagnosis not present

## 2022-07-16 DIAGNOSIS — R059 Cough, unspecified: Secondary | ICD-10-CM | POA: Diagnosis not present

## 2022-07-22 ENCOUNTER — Ambulatory Visit
Admission: RE | Admit: 2022-07-22 | Discharge: 2022-07-22 | Disposition: A | Discharge status: Home / Self Care | Payer: Medicare Other | Source: Ambulatory Visit | Attending: Neurosurgery | Admitting: Neurosurgery

## 2022-07-22 DIAGNOSIS — G919 Hydrocephalus, unspecified: Secondary | ICD-10-CM | POA: Diagnosis not present

## 2022-07-22 DIAGNOSIS — Z6830 Body mass index (BMI) 30.0-30.9, adult: Secondary | ICD-10-CM | POA: Diagnosis not present

## 2022-07-22 DIAGNOSIS — D432 Neoplasm of uncertain behavior of brain, unspecified: Secondary | ICD-10-CM | POA: Diagnosis not present

## 2022-07-22 MED ORDER — GADOPICLENOL 0.5 MMOL/ML IV SOLN
9.0000 mL | Freq: Once | INTRAVENOUS | Status: AC | PRN
  Administered 2022-07-22: 9 mL via INTRAVENOUS

## 2022-07-22 MED ORDER — GADOPICLENOL 0.5 MMOL/ML IV SOLN: 9.0000 mL | Freq: Once | INTRAVENOUS | Status: DC | PRN

## 2022-07-27 ENCOUNTER — Inpatient Hospital Stay: Payer: Medicare Other | Attending: Neurosurgery

## 2022-07-28 DIAGNOSIS — N201 Calculus of ureter: Secondary | ICD-10-CM | POA: Diagnosis not present

## 2022-07-28 DIAGNOSIS — Z125 Encounter for screening for malignant neoplasm of prostate: Secondary | ICD-10-CM | POA: Diagnosis not present

## 2022-07-28 DIAGNOSIS — I878 Other specified disorders of veins: Secondary | ICD-10-CM | POA: Diagnosis not present

## 2022-07-28 DIAGNOSIS — N401 Enlarged prostate with lower urinary tract symptoms: Secondary | ICD-10-CM | POA: Diagnosis not present

## 2022-07-28 DIAGNOSIS — Z87442 Personal history of urinary calculi: Secondary | ICD-10-CM | POA: Diagnosis not present

## 2022-07-28 DIAGNOSIS — N390 Urinary tract infection, site not specified: Secondary | ICD-10-CM | POA: Diagnosis not present

## 2022-07-28 DIAGNOSIS — N210 Calculus in bladder: Secondary | ICD-10-CM | POA: Diagnosis not present

## 2022-09-03 ENCOUNTER — Other Ambulatory Visit: Payer: Self-pay | Admitting: Radiation Therapy

## 2022-09-03 ENCOUNTER — Telehealth: Payer: Self-pay | Admitting: Radiation Therapy

## 2022-09-03 DIAGNOSIS — D432 Neoplasm of uncertain behavior of brain, unspecified: Secondary | ICD-10-CM

## 2022-09-03 NOTE — Telephone Encounter (Signed)
Spoke with Jose Ramirez about the scheduled MRI on 3/7 at [place] and the follow-up with Dr. Kathyrn Sheriff at 11:30am, same day, to reprogram his shunt and review those scan results.   Mont Dutton R.T.(R)(T) Radiation Special Procedures Navigator

## 2022-09-09 ENCOUNTER — Telehealth: Payer: Self-pay

## 2022-09-09 NOTE — Patient Outreach (Signed)
  Care Coordination   Initial Visit Note   09/09/2022 Name: Jose Ramirez MRN: [id number] DOB: [birth year]  Jose Ramirez is a 72 y.o. year old male who sees Cyndi Bender, [place] for primary care. I spoke with  Burton Apley by phone today.  What matters to the patients health and wellness today?  Placed call to patient to review and offer [facility] care coordination program. Patient reports that he is doing well and denies any needs today.     SDOH assessments and interventions completed:  No     Care Coordination Interventions:  No, not indicated   Follow up plan: No further intervention required.   Encounter Outcome:  Pt. Refused   Tomasa Rand, RN, BSN, CEN [facility] [employer] [phone]

## 2022-10-14 DIAGNOSIS — Z79899 Other long term (current) drug therapy: Secondary | ICD-10-CM | POA: Diagnosis not present

## 2022-10-14 DIAGNOSIS — M542 Cervicalgia: Secondary | ICD-10-CM | POA: Diagnosis not present

## 2022-10-14 DIAGNOSIS — G4700 Insomnia, unspecified: Secondary | ICD-10-CM | POA: Diagnosis not present

## 2022-10-14 DIAGNOSIS — E1169 Type 2 diabetes mellitus with other specified complication: Secondary | ICD-10-CM | POA: Diagnosis not present

## 2022-10-14 DIAGNOSIS — Z9181 History of falling: Secondary | ICD-10-CM | POA: Diagnosis not present

## 2022-10-14 DIAGNOSIS — G919 Hydrocephalus, unspecified: Secondary | ICD-10-CM | POA: Diagnosis not present

## 2022-10-14 DIAGNOSIS — I10 Essential (primary) hypertension: Secondary | ICD-10-CM | POA: Diagnosis not present

## 2022-10-14 DIAGNOSIS — E782 Mixed hyperlipidemia: Secondary | ICD-10-CM | POA: Diagnosis not present

## 2022-10-14 DIAGNOSIS — Z8673 Personal history of transient ischemic attack (TIA), and cerebral infarction without residual deficits: Secondary | ICD-10-CM | POA: Diagnosis not present

## 2022-10-14 DIAGNOSIS — Z1331 Encounter for screening for depression: Secondary | ICD-10-CM | POA: Diagnosis not present

## 2022-10-22 ENCOUNTER — Ambulatory Visit
Admission: RE | Admit: 2022-10-22 | Discharge: 2022-10-22 | Disposition: A | Discharge status: Home / Self Care | Payer: Medicare HMO | Source: Ambulatory Visit | Attending: Radiation Oncology | Admitting: Radiation Oncology

## 2022-10-22 DIAGNOSIS — D432 Neoplasm of uncertain behavior of brain, unspecified: Secondary | ICD-10-CM

## 2022-10-22 DIAGNOSIS — C729 Malignant neoplasm of central nervous system, unspecified: Secondary | ICD-10-CM | POA: Diagnosis not present

## 2022-10-22 MED ORDER — GADOPICLENOL 0.5 MMOL/ML IV SOLN
8.0000 mL | Freq: Once | INTRAVENOUS | Status: AC | PRN
  Administered 2022-10-22: 8 mL via INTRAVENOUS

## 2022-10-26 ENCOUNTER — Inpatient Hospital Stay: Payer: Medicaid Other | Attending: Neurosurgery

## 2023-01-14 DIAGNOSIS — K219 Gastro-esophageal reflux disease without esophagitis: Secondary | ICD-10-CM | POA: Diagnosis not present

## 2023-01-14 DIAGNOSIS — I10 Essential (primary) hypertension: Secondary | ICD-10-CM | POA: Diagnosis not present

## 2023-01-14 DIAGNOSIS — Z8673 Personal history of transient ischemic attack (TIA), and cerebral infarction without residual deficits: Secondary | ICD-10-CM | POA: Diagnosis not present

## 2023-01-14 DIAGNOSIS — G919 Hydrocephalus, unspecified: Secondary | ICD-10-CM | POA: Diagnosis not present

## 2023-01-14 DIAGNOSIS — G4700 Insomnia, unspecified: Secondary | ICD-10-CM | POA: Diagnosis not present

## 2023-01-14 DIAGNOSIS — E1169 Type 2 diabetes mellitus with other specified complication: Secondary | ICD-10-CM | POA: Diagnosis not present

## 2023-01-14 DIAGNOSIS — E782 Mixed hyperlipidemia: Secondary | ICD-10-CM | POA: Diagnosis not present

## 2023-01-14 DIAGNOSIS — M542 Cervicalgia: Secondary | ICD-10-CM | POA: Diagnosis not present

## 2023-01-14 DIAGNOSIS — R110 Nausea: Secondary | ICD-10-CM | POA: Diagnosis not present

## 2023-01-28 DIAGNOSIS — N210 Calculus in bladder: Secondary | ICD-10-CM | POA: Diagnosis not present

## 2023-01-28 DIAGNOSIS — N390 Urinary tract infection, site not specified: Secondary | ICD-10-CM | POA: Diagnosis not present

## 2023-01-28 DIAGNOSIS — N201 Calculus of ureter: Secondary | ICD-10-CM | POA: Diagnosis not present

## 2023-01-28 DIAGNOSIS — N401 Enlarged prostate with lower urinary tract symptoms: Secondary | ICD-10-CM | POA: Diagnosis not present

## 2023-04-21 ENCOUNTER — Other Ambulatory Visit: Payer: Self-pay | Admitting: Neurosurgery

## 2023-04-21 DIAGNOSIS — D432 Neoplasm of uncertain behavior of brain, unspecified: Secondary | ICD-10-CM

## 2023-05-06 DIAGNOSIS — G919 Hydrocephalus, unspecified: Secondary | ICD-10-CM | POA: Diagnosis not present

## 2023-05-06 DIAGNOSIS — K219 Gastro-esophageal reflux disease without esophagitis: Secondary | ICD-10-CM | POA: Diagnosis not present

## 2023-05-06 DIAGNOSIS — M542 Cervicalgia: Secondary | ICD-10-CM | POA: Diagnosis not present

## 2023-05-06 DIAGNOSIS — I10 Essential (primary) hypertension: Secondary | ICD-10-CM | POA: Diagnosis not present

## 2023-05-06 DIAGNOSIS — G4700 Insomnia, unspecified: Secondary | ICD-10-CM | POA: Diagnosis not present

## 2023-05-06 DIAGNOSIS — E782 Mixed hyperlipidemia: Secondary | ICD-10-CM | POA: Diagnosis not present

## 2023-05-06 DIAGNOSIS — Z8673 Personal history of transient ischemic attack (TIA), and cerebral infarction without residual deficits: Secondary | ICD-10-CM | POA: Diagnosis not present

## 2023-05-06 DIAGNOSIS — E1169 Type 2 diabetes mellitus with other specified complication: Secondary | ICD-10-CM | POA: Diagnosis not present

## 2023-05-06 DIAGNOSIS — Z79899 Other long term (current) drug therapy: Secondary | ICD-10-CM | POA: Diagnosis not present

## 2023-05-06 DIAGNOSIS — Z125 Encounter for screening for malignant neoplasm of prostate: Secondary | ICD-10-CM | POA: Diagnosis not present

## 2023-05-27 DIAGNOSIS — Z1211 Encounter for screening for malignant neoplasm of colon: Secondary | ICD-10-CM | POA: Diagnosis not present

## 2023-05-27 DIAGNOSIS — Z1331 Encounter for screening for depression: Secondary | ICD-10-CM | POA: Diagnosis not present

## 2023-05-27 DIAGNOSIS — Z9181 History of falling: Secondary | ICD-10-CM | POA: Diagnosis not present

## 2023-05-27 DIAGNOSIS — Z0000 Encounter for general adult medical examination without abnormal findings: Secondary | ICD-10-CM | POA: Diagnosis not present

## 2023-05-27 DIAGNOSIS — Z139 Encounter for screening, unspecified: Secondary | ICD-10-CM | POA: Diagnosis not present

## 2023-05-30 ENCOUNTER — Other Ambulatory Visit: Payer: Medicare HMO

## 2023-06-01 ENCOUNTER — Telehealth: Payer: Self-pay | Admitting: Radiation Therapy

## 2023-06-01 ENCOUNTER — Other Ambulatory Visit: Payer: Self-pay | Admitting: Radiation Therapy

## 2023-06-01 NOTE — Telephone Encounter (Signed)
I left a detailed voicemail about Leander's November brain MRI and same day follow-up with Dr. Conchita Paris to adjust his shunt. My contact information was included in case he has questions or concerns.   Jalene Mullet R.T.(R)(T) Radiation Special Procedures Navigator

## 2023-07-07 ENCOUNTER — Ambulatory Visit
Admission: RE | Admit: 2023-07-07 | Discharge: 2023-07-07 | Disposition: A | Discharge status: Home / Self Care | Payer: Medicare HMO | Source: Ambulatory Visit | Attending: Neurosurgery

## 2023-07-07 DIAGNOSIS — G919 Hydrocephalus, unspecified: Secondary | ICD-10-CM | POA: Diagnosis not present

## 2023-07-07 DIAGNOSIS — D432 Neoplasm of uncertain behavior of brain, unspecified: Secondary | ICD-10-CM

## 2023-07-07 DIAGNOSIS — G9389 Other specified disorders of brain: Secondary | ICD-10-CM | POA: Diagnosis not present

## 2023-07-07 MED ORDER — GADOPICLENOL 0.5 MMOL/ML IV SOLN
9.0000 mL | Freq: Once | INTRAVENOUS | Status: AC | PRN
  Administered 2023-07-07: 9 mL via INTRAVENOUS

## 2023-07-09 ENCOUNTER — Other Ambulatory Visit: Payer: Medicare HMO

## 2023-08-06 DIAGNOSIS — G4700 Insomnia, unspecified: Secondary | ICD-10-CM | POA: Diagnosis not present

## 2023-08-06 DIAGNOSIS — G919 Hydrocephalus, unspecified: Secondary | ICD-10-CM | POA: Diagnosis not present

## 2023-08-06 DIAGNOSIS — M542 Cervicalgia: Secondary | ICD-10-CM | POA: Diagnosis not present

## 2023-08-06 DIAGNOSIS — E1169 Type 2 diabetes mellitus with other specified complication: Secondary | ICD-10-CM | POA: Diagnosis not present

## 2023-08-06 DIAGNOSIS — K219 Gastro-esophageal reflux disease without esophagitis: Secondary | ICD-10-CM | POA: Diagnosis not present

## 2023-08-06 DIAGNOSIS — N390 Urinary tract infection, site not specified: Secondary | ICD-10-CM | POA: Diagnosis not present

## 2023-08-06 DIAGNOSIS — I10 Essential (primary) hypertension: Secondary | ICD-10-CM | POA: Diagnosis not present

## 2023-08-06 DIAGNOSIS — Z8673 Personal history of transient ischemic attack (TIA), and cerebral infarction without residual deficits: Secondary | ICD-10-CM | POA: Diagnosis not present

## 2023-08-06 DIAGNOSIS — E782 Mixed hyperlipidemia: Secondary | ICD-10-CM | POA: Diagnosis not present

## 2023-09-08 ENCOUNTER — Other Ambulatory Visit: Payer: Self-pay | Admitting: Radiation Therapy

## 2023-09-14 ENCOUNTER — Other Ambulatory Visit: Payer: Self-pay | Admitting: Radiation Therapy

## 2023-09-14 DIAGNOSIS — D432 Neoplasm of uncertain behavior of brain, unspecified: Secondary | ICD-10-CM

## 2023-09-15 ENCOUNTER — Telehealth: Payer: Self-pay | Admitting: Radiation Therapy

## 2023-09-15 NOTE — Telephone Encounter (Signed)
I spoke with Mr. Buckles about his upcoming brain MRI and follow-up with Dr. Conchita Paris to adjust his shunt on 5/19. He has this information written down and plans to attend both visits.   Jalene Mullet R.T.(R)(T) Radiation Special Procedures Lead

## 2023-10-20 DIAGNOSIS — R059 Cough, unspecified: Secondary | ICD-10-CM | POA: Diagnosis not present

## 2023-12-08 DIAGNOSIS — M542 Cervicalgia: Secondary | ICD-10-CM | POA: Diagnosis not present

## 2023-12-08 DIAGNOSIS — G4700 Insomnia, unspecified: Secondary | ICD-10-CM | POA: Diagnosis not present

## 2023-12-08 DIAGNOSIS — E782 Mixed hyperlipidemia: Secondary | ICD-10-CM | POA: Diagnosis not present

## 2023-12-08 DIAGNOSIS — K219 Gastro-esophageal reflux disease without esophagitis: Secondary | ICD-10-CM | POA: Diagnosis not present

## 2023-12-08 DIAGNOSIS — G919 Hydrocephalus, unspecified: Secondary | ICD-10-CM | POA: Diagnosis not present

## 2023-12-08 DIAGNOSIS — I10 Essential (primary) hypertension: Secondary | ICD-10-CM | POA: Diagnosis not present

## 2023-12-08 DIAGNOSIS — Z8673 Personal history of transient ischemic attack (TIA), and cerebral infarction without residual deficits: Secondary | ICD-10-CM | POA: Diagnosis not present

## 2023-12-08 DIAGNOSIS — E1169 Type 2 diabetes mellitus with other specified complication: Secondary | ICD-10-CM | POA: Diagnosis not present

## 2024-01-03 ENCOUNTER — Ambulatory Visit
Admission: RE | Admit: 2024-01-03 | Discharge: 2024-01-03 | Disposition: A | Discharge status: Home / Self Care | Payer: Medicare HMO | Source: Ambulatory Visit | Attending: Neurosurgery | Admitting: Neurosurgery

## 2024-01-03 DIAGNOSIS — D432 Neoplasm of uncertain behavior of brain, unspecified: Secondary | ICD-10-CM

## 2024-01-03 DIAGNOSIS — D332 Benign neoplasm of brain, unspecified: Secondary | ICD-10-CM | POA: Diagnosis not present

## 2024-01-03 DIAGNOSIS — G919 Hydrocephalus, unspecified: Secondary | ICD-10-CM | POA: Diagnosis not present

## 2024-01-03 MED ORDER — GADOPICLENOL 0.5 MMOL/ML IV SOLN
8.0000 mL | Freq: Once | INTRAVENOUS | Status: AC | PRN
  Administered 2024-01-03: 8 mL via INTRAVENOUS

## 2024-02-08 DIAGNOSIS — N210 Calculus in bladder: Secondary | ICD-10-CM | POA: Diagnosis not present

## 2024-02-08 DIAGNOSIS — N390 Urinary tract infection, site not specified: Secondary | ICD-10-CM | POA: Diagnosis not present

## 2024-03-13 DIAGNOSIS — E782 Mixed hyperlipidemia: Secondary | ICD-10-CM | POA: Diagnosis not present

## 2024-03-13 DIAGNOSIS — H6123 Impacted cerumen, bilateral: Secondary | ICD-10-CM | POA: Diagnosis not present

## 2024-03-13 DIAGNOSIS — M542 Cervicalgia: Secondary | ICD-10-CM | POA: Diagnosis not present

## 2024-03-13 DIAGNOSIS — Z8673 Personal history of transient ischemic attack (TIA), and cerebral infarction without residual deficits: Secondary | ICD-10-CM | POA: Diagnosis not present

## 2024-03-13 DIAGNOSIS — G4700 Insomnia, unspecified: Secondary | ICD-10-CM | POA: Diagnosis not present

## 2024-03-13 DIAGNOSIS — I10 Essential (primary) hypertension: Secondary | ICD-10-CM | POA: Diagnosis not present

## 2024-03-13 DIAGNOSIS — K219 Gastro-esophageal reflux disease without esophagitis: Secondary | ICD-10-CM | POA: Diagnosis not present

## 2024-03-13 DIAGNOSIS — G919 Hydrocephalus, unspecified: Secondary | ICD-10-CM | POA: Diagnosis not present

## 2024-03-13 DIAGNOSIS — E1169 Type 2 diabetes mellitus with other specified complication: Secondary | ICD-10-CM | POA: Diagnosis not present

## 2024-03-22 DIAGNOSIS — H6123 Impacted cerumen, bilateral: Secondary | ICD-10-CM | POA: Diagnosis not present
